# Patient Record
Sex: Female | Born: 1935 | Race: White | Hispanic: No | Marital: Married | Smoking: Never smoker
Health system: Southern US, Community
[De-identification: ages and names within clinical notes are randomized; demographics above are authoritative.]

## PROBLEM LIST (undated history)

## (undated) DIAGNOSIS — M069 Rheumatoid arthritis, unspecified: Secondary | ICD-10-CM

## (undated) DIAGNOSIS — M797 Fibromyalgia: Secondary | ICD-10-CM

## (undated) HISTORY — PX: CYSTOSCOPY: SUR368

## (undated) HISTORY — PX: DILATION AND CURETTAGE OF UTERUS: SHX78

## (undated) HISTORY — PX: OTHER SURGICAL HISTORY: SHX169

## (undated) HISTORY — PX: TONSILLECTOMY: SUR1361

---

## 1999-01-22 ENCOUNTER — Other Ambulatory Visit: Admission: RE | Admit: 1999-01-22 | Discharge: 1999-01-22 | Payer: Self-pay | Admitting: Obstetrics & Gynecology

## 1999-07-09 ENCOUNTER — Encounter: Admission: RE | Admit: 1999-07-09 | Discharge: 1999-08-29 | Payer: Self-pay | Admitting: Specialist

## 1999-11-27 ENCOUNTER — Encounter: Payer: Self-pay | Admitting: Specialist

## 1999-11-27 ENCOUNTER — Ambulatory Visit (HOSPITAL_COMMUNITY): Admission: RE | Admit: 1999-11-27 | Discharge: 1999-11-27 | Payer: Self-pay | Admitting: Specialist

## 2000-04-07 ENCOUNTER — Other Ambulatory Visit: Admission: RE | Admit: 2000-04-07 | Discharge: 2000-04-07 | Payer: Self-pay | Admitting: Obstetrics & Gynecology

## 2001-05-24 ENCOUNTER — Other Ambulatory Visit: Admission: RE | Admit: 2001-05-24 | Discharge: 2001-05-24 | Payer: Self-pay | Admitting: Obstetrics & Gynecology

## 2002-11-08 ENCOUNTER — Emergency Department (HOSPITAL_COMMUNITY): Admission: EM | Admit: 2002-11-08 | Discharge: 2002-11-08 | Payer: Self-pay | Admitting: Emergency Medicine

## 2002-12-08 ENCOUNTER — Encounter: Admission: RE | Admit: 2002-12-08 | Discharge: 2002-12-08 | Payer: Self-pay | Admitting: Interventional Radiology

## 2003-08-02 ENCOUNTER — Inpatient Hospital Stay (HOSPITAL_COMMUNITY): Admission: EM | Admit: 2003-08-02 | Discharge: 2003-08-06 | Payer: Self-pay | Admitting: Emergency Medicine

## 2003-08-23 ENCOUNTER — Encounter: Payer: Self-pay | Admitting: Gastroenterology

## 2003-09-13 ENCOUNTER — Ambulatory Visit (HOSPITAL_COMMUNITY): Admission: RE | Admit: 2003-09-13 | Discharge: 2003-09-13 | Payer: Self-pay | Admitting: Internal Medicine

## 2003-11-15 ENCOUNTER — Other Ambulatory Visit: Admission: RE | Admit: 2003-11-15 | Discharge: 2003-11-15 | Payer: Self-pay | Admitting: Obstetrics & Gynecology

## 2003-12-26 ENCOUNTER — Encounter (HOSPITAL_COMMUNITY): Admission: RE | Admit: 2003-12-26 | Discharge: 2004-03-25 | Payer: Self-pay | Admitting: Internal Medicine

## 2004-02-16 ENCOUNTER — Emergency Department (HOSPITAL_COMMUNITY): Admission: EM | Admit: 2004-02-16 | Discharge: 2004-02-17 | Payer: Self-pay | Admitting: Emergency Medicine

## 2005-04-09 ENCOUNTER — Encounter (HOSPITAL_COMMUNITY): Admission: RE | Admit: 2005-04-09 | Discharge: 2005-07-08 | Payer: Self-pay | Admitting: Internal Medicine

## 2005-06-12 ENCOUNTER — Emergency Department (HOSPITAL_COMMUNITY): Admission: EM | Admit: 2005-06-12 | Discharge: 2005-06-12 | Payer: Self-pay | Admitting: Emergency Medicine

## 2005-12-04 ENCOUNTER — Other Ambulatory Visit: Admission: RE | Admit: 2005-12-04 | Discharge: 2005-12-04 | Payer: Self-pay | Admitting: Obstetrics & Gynecology

## 2006-07-04 ENCOUNTER — Inpatient Hospital Stay (HOSPITAL_COMMUNITY): Admission: EM | Admit: 2006-07-04 | Discharge: 2006-07-08 | Payer: Self-pay | Admitting: Emergency Medicine

## 2006-07-08 ENCOUNTER — Ambulatory Visit: Payer: Self-pay | Admitting: Internal Medicine

## 2006-07-09 ENCOUNTER — Emergency Department (HOSPITAL_COMMUNITY): Admission: EM | Admit: 2006-07-09 | Discharge: 2006-07-09 | Payer: Self-pay | Admitting: Emergency Medicine

## 2006-09-07 ENCOUNTER — Ambulatory Visit: Payer: Self-pay | Admitting: Gastroenterology

## 2007-12-16 ENCOUNTER — Encounter: Admission: RE | Admit: 2007-12-16 | Discharge: 2007-12-16 | Payer: Self-pay | Admitting: Internal Medicine

## 2008-09-21 ENCOUNTER — Inpatient Hospital Stay (HOSPITAL_COMMUNITY): Admission: EM | Admit: 2008-09-21 | Discharge: 2008-09-25 | Payer: Self-pay | Admitting: Emergency Medicine

## 2009-11-05 ENCOUNTER — Inpatient Hospital Stay (HOSPITAL_COMMUNITY): Admission: EM | Admit: 2009-11-05 | Discharge: 2009-11-15 | Payer: Self-pay | Admitting: Emergency Medicine

## 2009-11-07 ENCOUNTER — Encounter (INDEPENDENT_AMBULATORY_CARE_PROVIDER_SITE_OTHER): Payer: Self-pay | Admitting: *Deleted

## 2009-11-22 ENCOUNTER — Encounter (INDEPENDENT_AMBULATORY_CARE_PROVIDER_SITE_OTHER): Payer: Self-pay | Admitting: *Deleted

## 2009-12-12 ENCOUNTER — Ambulatory Visit (HOSPITAL_COMMUNITY): Admission: RE | Admit: 2009-12-12 | Discharge: 2009-12-12 | Payer: Self-pay | Admitting: Internal Medicine

## 2010-03-05 ENCOUNTER — Encounter (HOSPITAL_COMMUNITY): Admission: RE | Admit: 2010-03-05 | Discharge: 2010-03-06 | Payer: Self-pay | Admitting: Internal Medicine

## 2010-03-30 ENCOUNTER — Encounter (INDEPENDENT_AMBULATORY_CARE_PROVIDER_SITE_OTHER): Payer: Self-pay | Admitting: *Deleted

## 2010-03-30 ENCOUNTER — Emergency Department (HOSPITAL_COMMUNITY): Admission: EM | Admit: 2010-03-30 | Discharge: 2010-03-30 | Payer: Self-pay | Admitting: Emergency Medicine

## 2010-04-15 ENCOUNTER — Emergency Department (HOSPITAL_COMMUNITY): Admission: EM | Admit: 2010-04-15 | Discharge: 2010-04-15 | Payer: Self-pay | Admitting: Emergency Medicine

## 2010-05-07 ENCOUNTER — Ambulatory Visit: Payer: Self-pay | Admitting: Gastroenterology

## 2010-05-07 DIAGNOSIS — M069 Rheumatoid arthritis, unspecified: Secondary | ICD-10-CM

## 2010-05-07 DIAGNOSIS — E119 Type 2 diabetes mellitus without complications: Secondary | ICD-10-CM

## 2010-05-07 DIAGNOSIS — IMO0001 Reserved for inherently not codable concepts without codable children: Secondary | ICD-10-CM

## 2010-05-07 DIAGNOSIS — K5732 Diverticulitis of large intestine without perforation or abscess without bleeding: Secondary | ICD-10-CM

## 2010-06-04 ENCOUNTER — Encounter (HOSPITAL_COMMUNITY)
Admission: RE | Admit: 2010-06-04 | Discharge: 2010-09-02 | Payer: Self-pay | Source: Home / Self Care | Admitting: Internal Medicine

## 2010-06-25 ENCOUNTER — Ambulatory Visit: Payer: Self-pay | Admitting: Gastroenterology

## 2010-07-02 ENCOUNTER — Encounter: Payer: Self-pay | Admitting: Gastroenterology

## 2010-07-26 ENCOUNTER — Ambulatory Visit: Payer: Self-pay | Admitting: Cardiology

## 2010-08-27 ENCOUNTER — Ambulatory Visit: Payer: Self-pay | Admitting: Cardiology

## 2010-09-03 ENCOUNTER — Encounter (HOSPITAL_COMMUNITY)
Admission: RE | Admit: 2010-09-03 | Discharge: 2010-10-29 | Payer: Self-pay | Source: Home / Self Care | Attending: Internal Medicine | Admitting: Internal Medicine

## 2010-10-29 NOTE — Letter (Signed)
Summary: Diabetic Instructions   Gastroenterology  [address] [place], [place] [address]   Phone: [phone]  Fax: [phone]    MAELIN KURKOWSKI 12/03/1933 MRN: [id number]   X  ORAL DIABETIC MEDICATION INSTRUCTIONS  The day before your procedure:   Take your diabetic pill as you do normally  The day of your procedure:   Do not take your diabetic pill    We will check your blood sugar levels during the admission process and again in Recovery before discharging you home  ________________________________________________________________________  _  _   INSULIN (LONG ACTING) MEDICATION INSTRUCTIONS (Lantus, NPH, 70/30, Humulin, Novolin-N)   The day before your procedure:   Take  your regular evening dose    The day of your procedure:   Do not take your morning dose    _  _   INSULIN (SHORT ACTING) MEDICATION INSTRUCTIONS (Regular, Humulog, Novolog)   The day before your procedure:   Do not take your evening dose   The day of your procedure:   Do not take your morning dose   _  _   INSULIN PUMP MEDICATION INSTRUCTIONS  We will contact the physician managing your diabetic care for written dosage instructions for the day before your procedure and the day of your procedure.  Once we have received the instructions, we will contact you.

## 2010-10-29 NOTE — Procedures (Signed)
Summary: colonoscopy   Colonoscopy  Procedure date:  08/23/2003  Findings:      Results: Normal. Location:  [facility].   Patient Name: Patricia Mosley, Patricia Mosley MRN:  Procedure Procedures: Colonoscopy CPT: 16109.  Personnel: Endoscopist: Barbette Hair. Arlyce Dice, MD.  Indications Symptoms: Diarrhea  History  Current Medications: Patient is not currently taking Coumadin.  Pre-Exam Physical: Performed Aug 23, 2003. Entire physical exam was normal.  Exam Exam: Extent of exam reached: Cecum, extent intended: Cecum.  The cecum was identified by IC valve. Colon retroflexion performed. ASA Classification: II. Tolerance: good.  Monitoring: Pulse and BP monitoring, Oximetry used. Supplemental O2 given. at 2 Liters.  Colon Prep Used Golytely for colon prep. Prep results: excellent.  Sedation Meds: Fentanyl 125 mcg. given IV. Versed 12.5 given IV.  Findings NORMAL EXAM: Transverse Colon.  NORMAL EXAM: Ascending Colon.  NORMAL EXAM: Descending Colon.  NORMAL EXAM: Cecum.  NORMAL EXAM: Sigmoid Colon.  NORMAL EXAM: Rectum.   Assessment Normal examination.  Events  Unplanned Interventions: No intervention was required.  Unplanned Events: There were no complications. Plans Medication Plan: Fiber supplements: Bran 1 Tbsp QD,   Scheduling/Referral: Office Visit, to [employer]. Arlyce Dice, MD, around Sep 13, 2003.    This report was created from the original endoscopy report, which was reviewed and signed by the above listed endoscopist.

## 2010-10-29 NOTE — Assessment & Plan Note (Signed)
Summary: nauseated//diarrhea--ch.   History of Present Illness Visit Type: Initial Consult Primary GI MD: Melvia Heaps MD [facility] Primary Provider: Rodrigo Ran, MD Chief Complaint: follow-up ER Visit  No complaints at this time History of Present Illness:   Patricia Mosley is a pleasant 77 year old white female referred  at the request of Dr. Waynard Edwards evaluation for evaluation of abdominal pain. In February, 2011 she was hospitalized with a walled off diverticular abscess of the sigmoid.  She was treated conservatively with antibiotics.  Approximately 1 month ago she was seen in the ER with right lower quadrant pain.  CT Scan, which I reviewed, demonstrated some thickening of the sigmoid colon and mild stranding consistent with diverticulitis for which she was placed on antibiotics.  Pain has entirely resolved.  She currently has no GI complaints.  Colonoscopy in 2004 was entirely normal.  She denies change in bowel habits melena or hematochezia.   GI Review of Systems    Reports abdominal pain, acid reflux, and  chest pain.      Denies belching, bloating, dysphagia with liquids, dysphagia with solids, heartburn, loss of appetite, nausea, vomiting, vomiting blood, weight loss, and  weight gain.        Denies anal fissure, black tarry stools, change in bowel habit, constipation, diarrhea, diverticulosis, fecal incontinence, heme positive stool, hemorrhoids, irritable bowel syndrome, jaundice, light color stool, liver problems, rectal bleeding, and  rectal pain. Preventive Screening-Counseling & Management  Alcohol-Tobacco     Smoking Status: never      Drug Use:  no.      Current Medications (verified): 1)  Tylenol Arthritis Pain 650 Mg Cr-Tabs (Acetaminophen) .... As Needed 2)  Fluoxetine Hcl 20 Mg Tabs (Fluoxetine Hcl) .Marland Kitchen.. 1 By Mouth Once Daily 3)  Hydroxychloroquine Sulfate 200 Mg Tabs (Hydroxychloroquine Sulfate) .Marland Kitchen.. 1 By Mouth Two Times A Day 4)  Januvia 100 Mg Tabs (Sitagliptin  Phosphate) .Marland KitchenMarland KitchenMarland Kitchen 150 Mg Daily 5)  Lorazepam 0.5 Mg Tabs (Lorazepam) .Marland Kitchen.. 1 By Mouth As Needed 6)  Prednisone 5 Mg Tabs (Prednisone) .... 7.5 Mg Once Daily 7)  Vitamin D3 1000 Unit Tabs (Cholecalciferol) .... Take Two Times A Day 8)  Boniva 3 Mg/39ml Kit (Ibandronate Sodium) .... Inject Quartly  Allergies (verified): 1)  ! * Actonel 2)  ! Codeine 3)  ! Demerol 4)  ! Doxycycline 5)  ! Epinephrine 6)  ! Flagyl 7)  ! * Humira 8)  ! Methotrexate 9)  ! Neurontin 10)  ! Pcn 11)  ! * Remicade 12)  ! * Robaxin 13)  ! Sulfa 14)  ! Talwin 15)  ! Tetracycline 16)  ! * Shellfish  Past History:  Past Medical History: Diverticular abscess Rheumatoid Arthritis Type 2 diabetes GERD Osetoporosis Anxiety Disorder Arthritis Diverticulitis Fibromyalgia  Past Surgical History: Appendectomy Womb  Tonsillectomy  Family History: Reviewed history and no changes required. Family History of Breast Cancer:sister  Aunt No FH of Colon Cancer:  Social History: Reviewed history and no changes required. Married 2 boys Retired Patient has never smoked.  Alcohol Use - no Daily Caffeine Use Illicit Drug Use - no Smoking Status:  never Drug Use:  no  Review of Systems       The patient complains of arthritis/joint pain, back pain, fatigue, itching, and skin rash.  The patient denies allergy/sinus, anemia, anxiety-new, blood in urine, breast changes/lumps, change in vision, confusion, cough, coughing up blood, depression-new, fainting, fever, headaches-new, hearing problems, heart murmur, heart rhythm changes, muscle pains/cramps, night sweats, nosebleeds, shortness  of breath, sleeping problems, sore throat, swelling of feet/legs, swollen lymph glands, thirst - excessive, urination - excessive, urination changes/pain, urine leakage, vision changes, and voice change.         All other systems were reviewed and were negative   Vital Signs:  Patient profile:   77 year old female Height:       65 inches Weight:      149 pounds BMI:     24.88 Pulse rate:   96 / minute Pulse rhythm:   regular BP sitting:   102 / 76  (left arm)  Vitals Entered By: Milford Cage NCMA (May 07, 2010 3:11 PM)  Physical Exam  Additional Exam:  On physical exam she is a well-developed large female  Physical Exam: General:   WDWN HEENT:   anicteric.  No pharyngeal abnormalities Neck:   No masses, thyroidmegaly Nodes:   No cervical, axillary, inguinal adenopathy Chest:    Clear to auscultation Cardiac:   No murmurs, gallops, rubs Abdomen:   BS active.  No abd masses, tenderness, organomegaly Rectal:   Deferred Extremities:   No cyanosis, clubbing, edema Skeletal:   No deformities Neuro:   Alert, oriented x3.  No focal abnormalities     Impression & Recommendations:  Problem # 1:  DIVERTICULITIS OF COLON (ICD-562.11)  Clinically  she has had diverticulitis twice in the last 6 months.  It is interesting that no diverticula were seen at her last colonoscopy,  which at least raises the question of a neoplastic process masking as diverticulitis.  Recommendations #1 colonoscopy  Risks, alternatives, and complications of the procedure, including bleeding, perforation, and possible need for surgery, were explained to the patient.  Patient's questions were answered.  Orders: Colonoscopy (Colon)  Problem # 2:  RHEUMATOID ARTHRITIS (ICD-714.0) Assessment: Comment Only  Problem # 3:  DM (ICD-250.00) Assessment: Comment Only  Problem # 4:  FIBROMYALGIA (ICD-729.1) Assessment: Comment Only  Patient Instructions: 1)  Copy sent to : Rodrigo Ran, MD 2)  Colonoscopy and Flexible Sigmoidoscopy brochure given.  3)  Conscious Sedation brochure given.  4)  Your Colonoscopy is scheduled for 06/11/2010 at 3pm 5)  You can pick up your MoviPrep today 6)  The medication list was reviewed and reconciled.  All changed / newly prescribed medications were explained.  A complete medication list  was provided to the patient / caregiver. Prescriptions: MOVIPREP 100 GM  SOLR (PEG-KCL-NACL-NASULF-NA ASC-C) As per prep instructions.  #1 x 0   Entered by:   Merri Ray CMA (AAMA)   Authorized by:   Louis Meckel MD   Signed by:   Louis Meckel MD on 05/08/2010   Method used:   Electronically to        [facility] (retail)       [address]       [place], [place]  [address]       Ph: [phone] or [phone]       Fax: [phone]   RxID:   [id number]

## 2010-10-29 NOTE — Discharge Summary (Signed)
Summary: Abdominal pain and diverticulitis  NAME:  Patricia Mosley, Patricia Mosley                ACCOUNT NO.:  [id number]      MEDICAL RECORD NO.:  [id number]          PATIENT TYPE:  INP      LOCATION:  5148                         FACILITY:  [facility]      PHYSICIAN:  Mark A. Perini, M.D.   DATE OF BIRTH:  [birth year]      DATE OF ADMISSION:  11/05/2009   DATE OF DISCHARGE:  11/15/2009                                  DISCHARGE SUMMARY      DISCHARGE DIAGNOSES:   1. Diverticular abscess.   2. Rheumatoid arthritis.   3. Chronic generalized body pain including shoulder pain.   4. Chronic immunosuppression with prednisone and recently Plaquenil       and Arava as well.   5. Abdominal pain due to diverticular abscess.   6. Type 2 diabetes.   7. Gastroesophageal reflux.   8. Osteoporosis.   9. Multiple drug intolerances.      CONSULTATIONS:  General Surgery consultation.      PROCEDURE:  CT scan of the abdomen and pelvis with IV contrast.  She had   one on November 09, 2009 and one on November 05, 2009.  On the November 09, 2009 study, she had improvement in the pericolonic abscess with   decrease in size and decrease in fluid with only a focal sigmoid colon,   mucosal thickening and a small amount of air remaining.  She also had   duplication of the left ureter.  No hydronephrosis or hydroureter.  She   had degenerative changes of the lumbar spine noted.  She had a stable 5-   mm nodule in the left lung base laterally and a moderate-sized hiatal   hernia.      DISCHARGE MEDICATIONS:   1. Tylenol 650 mg every 6 hours as needed.   2. Bismuth subsalicylate 30 mL every 6 hours as needed.   3. Loperamide 2 mg as needed 2-3 times a day.   4. Over-the-counter Align 1 tablet daily, to take for an additional 14       days and then stop.   5. Calcium carbonate 600 mg with vitamin D 1 tablet daily with food.   6. Fluoxetine 20 mg daily.   7. Folic acid 1 mg daily.   8. Hydroxychloroquine 200 mg twice daily.    9. Januvia 150 mg daily.   10.Lorazepam 0.5 mg each evening as needed.   11.Prednisone 7.5 mg daily.   12.Ranitidine 150 mg twice daily.   13.Vitamin D3 1000 units 3 tablets daily.      HISTORY OF PRESENT ILLNESS:  Patricia Mosley is a pleasant 77 year old female who   presented with abdominal pain.  She had originally seen on February 1   and we have felt her symptoms were due to her Arava therapy.  However,   her symptoms worsened and on the day of admission, she had severe   abdominal pain and presented to the emergency room where she was found   to have a diverticular abscess.  HOSPITAL COURSE:  [place] was admitted to a regular bed.  She was placed   on ertapenem per pharmacy, which was a good choice for her based on her   multiple drug intolerances.  Fortunately, she tolerated the ertapenem   well with no severe side effects.  She did have some loose stool   periodically, but was ruled out for C. difficile colitis.  Her abdominal   pain gradually improved over the ensuing 3-4 days.  There was no easy   oral antibiotics to switch her to, so we decided to complete a full 10-   day course of the IV antibiotic in the hospital, which she completed   without difficulty.  Fortunately, a repeat CT scan showed improvement of   her diverticular abscess and on November 15, 2009, she was deemed stable   for discharge home.      DISCHARGE PHYSICAL EXAMINATION:  VITAL SIGNS:  Temperature 97.6,   afebrile, pulse 68, respiratory 19, blood pressure 120/74, 97%   saturation on room air.  Blood sugars ranged from 103 to 159.   GENERAL:  She is in no acute distress.  Alert and oriented x4.   LUNGS:  Clear to auscultation bilaterally with no wheezes, rales, or   rhonchi.   HEART:  Regular rate and rhythm with no murmur, rub, or gallop.   ABDOMEN:  Soft, nontender, nondistended with no mass or   hepatosplenomegaly.  There was no peripheral edema.      DISCHARGE LABORATORY DATA:  On November 14, 2009,  sodium 140, potassium   4.3, chloride 104, CO2 30, BUN 10, creatinine 0.97, glucose 112.  GFR   56.  Total bili 0.6, alk phos 44, AST 43, ALT 42, total protein 5.9,   albumin 2.9, calcium 8.7.  White count 8.6, hemoglobin 13.5, platelet   count 393,000.  There were 61% segs, 28% lymphocytes, 9% monocytes.   Hemoglobin A1c this admission was 6.6%.      DISCHARGE INSTRUCTIONS:  Aki is to follow a low-salt, heart-healthy   diet.  She has no wounds to care for.  She will follow up in our office   in 2 weeks and she will follow up with Dr. Luisa Hart of General Surgery in   3-4 weeks.  She is to call if she has any recurrent problems.               Mark A. Waynard Edwards, M.D.            MAP/MEDQ  D:  11/21/2009  T:  11/22/2009  Job:  [id number]      Electronically Signed by Rodrigo Ran M.D. on 11/22/2009 11:54:04 AM   Electronically Signed by Rodrigo Ran M.D. on 11/22/2009 11:54:04 AM

## 2010-10-29 NOTE — Procedures (Signed)
Summary: Colonoscopy  Patient: Patricia Mosley Note: All result statuses are Final unless otherwise noted.  Tests: (1) Colonoscopy (COL)   COL Colonoscopy           DONE     [facility]     [address]     [place], [place]  [address]           COLONOSCOPY PROCEDURE REPORT           PATIENT:  Patricia Mosley, Patricia Mosley  MR#:  [id number]     BIRTHDATE:  November 03, 1933, 76 yrs. old  GENDER:  female           ENDOSCOPIST:  Barbette Hair. Arlyce Dice, MD     Referred by:           PROCEDURE DATE:  06/25/2010     PROCEDURE:  Colonoscopy with snare polypectomy     ASA CLASS:  Class II     INDICATIONS:  1) diverticulitis h/o recurrent diverticulitis           MEDICATIONS:   Fentanyl 100 mcg IV, Versed 10 mg IV, Benadryl 25     mg IV           DESCRIPTION OF PROCEDURE:   After the risks benefits and     alternatives of the procedure were thoroughly explained, informed     consent was obtained.  Digital rectal exam was performed and     revealed no abnormalities.   The LB [id number] endoscope     was introduced through the anus and advanced to the cecum, which     was identified by the ileocecal valve, without limitations.  The     quality of the prep was excellent, using MiraLax.  The instrument     was then slowly withdrawn as the colon was fully examined.     <<PROCEDUREIMAGES>>           FINDINGS:  A sessile polyp was found in the cecum. It was 5 mm in     size. Polyp was snared without cautery. Retrieval was successful     (see image2). snare polyp  Severe diverticulosis was found in the     sigmoid colon (see image8, image9, and image10). Marked muscular     hypertrophy, lumenal spasm and multiple diverticula  Internal     hemorrhoids were found (see image12).  This was otherwise a normal     examination of the colon (see image1, image3, image4, image6,     image7, and image11).   Retroflexed views in the rectum revealed     no abnormalities.    The time to cecum =  7.75  minutes. The  scope     was then withdrawn (time =  6.50  min) from the patient and the     procedure completed.           COMPLICATIONS:  None           ENDOSCOPIC IMPRESSION:     1) 5 mm sessile polyp in the cecum     2) Severe diverticulosis in the sigmoid colon     3) Internal hemorrhoids     4) Otherwise normal examination     RECOMMENDATIONS:     1) If the polyp(s) removed today are proven to be adenomatous     (pre-cancerous) polyps, you will need a repeat colonoscopy in 5     years. Otherwise you should continue to follow colorectal cancer  screening guidelines for "routine risk" patients with colonoscopy     in 10 years.           REPEAT EXAM:   You will receive a letter from Dr. Arlyce Dice in 1-2     weeks, after reviewing the final pathology, with followup     recommendations.           ______________________________     Barbette Hair Arlyce Dice, MD           CC: Rodrigo Ran, MD           n.     Rosalie DoctorBarbette Hair. Vaishali Baise at 06/25/2010 03:58 PM           Page 2 of 3   Patricia Mosley, Patricia Mosley, [id number]  Note: An exclamation mark (!) indicates a result that was not dispersed into the flowsheet. Document Creation Date: 06/25/2010 3:58 PM _______________________________________________________________________  (1) Order result status: Final Collection or observation date-time: 06/25/2010 15:51 Requested date-time:  Receipt date-time:  Reported date-time:  Referring Physician:   Ordering Physician: Melvia Heaps [id number]) Specimen Source:  Source: Launa Grill Order Number: [id number] Lab site:   Appended Document: Colonoscopy     Procedures Next Due Date:    Colonoscopy: 05/2015

## 2010-10-29 NOTE — Consult Note (Signed)
Patricia Mosley, Patricia Mosley                ACCOUNT NO.:  [id number]      MEDICAL RECORD NO.:  [id number]          PATIENT TYPE:  INP      LOCATION:  5148                         FACILITY:  [facility]      PHYSICIAN:  Maisie Fus A. Cornett, M.D.DATE OF BIRTH:  01-11-1934      DATE OF CONSULTATION:  11/05/2009   DATE OF DISCHARGE:                                    CONSULTATION      REQUESTING PHYSICIAN:  Mark A. Perini, MD      REASON FOR CONSULTATION:  Abdominal pain, diverticulitis.      HISTORY OF PRESENT ILLNESS:  Patricia Mosley is a pleasant 77 year old female   with complicated past medical history as seen below.  She has a history   of rheumatoid arthritis and fibromyalgia and was recently started on a   new medication I believe for her rheumatoid arthritis.  She reported   that shortly after it made her feel sick and she had abdominal   discomfort.  She discontinued this about a week ago but reported   continued back discomfort.  However, her abdomen has remained sore and   she has had increasingly more frequent abdominal pains, particularly in   the periumbilical and lower abdomen area.  She denies any hematemesis or   hematochezia.  She reports that her bowel movements have become more   frequent, but they are not loose, they continue to remain formed.  She   is not sure if this is related to the medication as well.  She denies   any fever, denies any vomiting but states she has been nauseated.   Again, she correlates this possibly to the medication.  She denies any   weight loss.  She denies any known bowel dysfunction.  She tells me that   she has had a previous history of ischemic colitis from a bleed from her   medication.  She has not had any significant trouble with her colon   since that time to her knowledge.  She is not known to have any history   of diverticular disease.      PAST MEDICAL HISTORY:  Significant for:   1. Rheumatoid arthritis.   2. Diet-controlled diabetes  mellitus.   3. Stage II chronic kidney insufficiency.   4. Fibromyalgia.   5. Osteoporosis.   6. Anxiety and depressive disorder.      PAST SURGICAL HISTORY:  The patient has had a cardiac catheterization in   December 2009, otherwise, negative.      FAMILY HISTORY:  Noncontributory at the present case.      SOCIAL HISTORY:  The patient denies any alcohol, tobacco or illicit drug   use.  She is married and currently lives at home with her husband.  She   has several children and multiple grandchildren.      DRUG ALLERGIES:  CODEINE, EPINEPHRINE, SULFA, PENICILLIN, DEMEROL,   TALWIN, ROBAXIN, NEURONTIN, QUININE, DILAUDID and FLAGYL.      CURRENT MEDICATIONS:  Caltrate plus D twice daily, folic acid, Januvia,  lorazepam, Plaquenil, prednisone, Phenergan, Prozac, ranitidine, Tylenol   and vitamin D supplement.      REVIEW OF SYSTEMS:  Found on the HPI otherwise, negative.      PHYSICAL EXAMINATION:  GENERAL:  A 77 year old pleasant white female   resting comfortably in the emergency room bed.  She does not appear to   be in acute distress.  She is nondiaphoretic and nontoxic appearing.   VITAL SIGNS:  Temperature of 98.8, heart rate of 84, respiratory rate of   18, blood pressure 114/71.   ENT:  Unremarkable.   NECK:  Supple without lymphadenopathy.  Trachea is midline.  Thyroid is   without masses or enlargement.   CHEST:  Clear to auscultation.  No wheezes, rhonchi or rales.  Normal   respiratory effort without use of accessory muscles.   HEART:  Regular rate and rhythm.  No murmurs, gallops or rubs.  Carotids   are 2+ and symmetrical without bruit.  Peripheral pulses are 2+ and   symmetrical.   ABDOMEN:  Soft, nondistended.  She is tender and does guard slightly   with deep palpation of the mid to lower suprapubic region.  She   otherwise has audible bowel sounds and otherwise no evidence of   herniation or organomegaly.   PELVIC:  Deferred.   GENITOURINARY:  Deferred.    RECTAL:  Deferred.   EXTREMITIES:  Within normal range of motion.  The patient has some   rheumatoid arthritis, defects with palmar deviation of the upper   extremity digits.  She otherwise has good muscle strength and tone   without atrophy.   SKIN:  Warm and dry, nondiaphoretic, no rashes or lesions are   appreciated, no lesions or nodules are palpated.   NEUROLOGIC:  The patient is alert and oriented x3.  Cranial nerves II-   XII grossly intact.      DIAGNOSTICS:  CBC performed in the emergency department reveals a white   blood cell count of 17.4, hemoglobin of 14.0, hematocrit of 41.8,   platelet count of 327.  Fecal sample for heme is positive.  Metabolic   panel reveals a sodium of 133, potassium 3.8, chloride of 98, CO2 of 24,   BUN of 12, creatinine of 1.0, glucose of 138.  Liver enzymes within   normal limits including a lipase of 29.  Urinalysis is negative.      IMAGING:  CT scan of the patient's abdomen and pelvis performed shows a   3.6 x 2.2 cm fluid collection in the mid left lower quadrant just above   and adjacent to the sigmoid colon.  There is high suspicion for evidence   of diverticular abscess without perforation.  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      IMPRESSION:   1. Left lower quadrant pain, likely associated to diverticulitis with       abscess.   2. Rheumatoid arthritis with chronic pain.   3. Fibromyalgia.   4. Diet controlled diabetes mellitus.      PLAN:  Agree with admission for IV fluid resuscitation, IV antibiotics   consisting of Invanz, hopefully, she can tolerate this given her   numerous allergies.  I have reviewed the CT scan and feel this would be   difficult for percutaneous drainage at present time.  We have discussed   with the patient the plan for conservative management with bowel rest   and  IV antibiotics.  Should she now show signs of improvement, a repeat    imaging may be warranted to determine if the abscess has increased in   size and may be more amenable to drainage.  Hopefully, she will tolerate   conservative management.  The abscess will diminish in size as well her   clinical symptoms and she can be discharged home on continued oral   management.  The patient does understand that if she does not progress   she may still need percutaneous drainage and if that is not amendable   she still may require exploratory laparotomy with the remote possibility   of end colostomy or temporary colostomy as part of her treatment.  She   was extremely anxious   about that and is only willing to participate with whatever conservative   management can be offered.  We will continue to follow this patient.  I   appreciate the opportunity to consult on her and participate in her   care.  The patient was actually seen by Dr. Carolynne Edouard in the emergency   department, will be followed by Dr. Harriette Bouillon.               Patricia El, PA-C         ______________________________   Clovis Pu Cornett, M.D.         KB/MEDQ  D:  11/06/2009  T:  11/07/2009  Job:  [id number]      cc:   Loraine Leriche A. Perini, M.D.   [facility]         Electronically Signed by Patricia Mosley  on 11/09/2009 11:14:30 AM      Electronically Signed by Harriette Bouillon M.D. on 11/12/2009 12:29:34 PM

## 2010-10-29 NOTE — Letter (Signed)
Summary: [facility] Instructions  [facility]  [address] [place], [place] [address]   Phone: [phone]  Fax: [phone]       Patricia Mosley    Nov 05, 1933    MRN: [id number]        Procedure Day /Date:TUESDAY 06/11/2010     Arrival Time:2PM     Procedure Time:3PM     Location of Procedure:                    X   [facility] (4th Floor)   PREPARATION FOR COLONOSCOPY WITH MOVIPREP   Starting 5 days prior to your procedure9/04/2010 do not eat nuts, seeds, popcorn, corn, beans, peas,  salads, or any raw vegetables.  Do not take any fiber supplements (e.g. Metamucil, Citrucel, and Benefiber).  THE DAY BEFORE YOUR PROCEDURE         DATE:06/10/2010  DAY: MONDAY  1.  Drink clear liquids the entire day-NO SOLID FOOD  2.  Do not drink anything colored red or purple.  Avoid juices with pulp.  No orange juice.  3.  Drink at least 64 oz. (8 glasses) of fluid/clear liquids during the day to prevent dehydration and help the prep work efficiently.  CLEAR LIQUIDS INCLUDE: Water Jello Ice Popsicles Tea (sugar ok, no milk/cream) Powdered fruit flavored drinks Coffee (sugar ok, no milk/cream) Gatorade Juice: apple, white grape, white cranberry  Lemonade Clear bullion, consomm, broth Carbonated beverages (any kind) Strained chicken noodle soup Hard Candy                             4.  In the morning, mix first dose of MoviPrep solution:    Empty 1 Pouch A and 1 Pouch B into the disposable container    Add lukewarm drinking water to the top line of the container. Mix to dissolve    Refrigerate (mixed solution should be used within 24 hrs)  5.  Begin drinking the prep at 5:00 p.m. The MoviPrep container is divided by 4 marks.   Every 15 minutes drink the solution down to the next mark (approximately 8 oz) until the full liter is complete.   6.  Follow completed prep with 16 oz of clear liquid of your choice (Nothing red or purple).  Continue to drink  clear liquids until bedtime.  7.  Before going to bed, mix second dose of MoviPrep solution:    Empty 1 Pouch A and 1 Pouch B into the disposable container    Add lukewarm drinking water to the top line of the container. Mix to dissolve    Refrigerate  THE DAY OF YOUR PROCEDURE      DATE: 06/11/2010 DAY: TUESDAY  Beginning at 10a.m. (5 hours before procedure):         1. Every 15 minutes, drink the solution down to the next mark (approx 8 oz) until the full liter is complete.  2. Follow completed prep with 16 oz. of clear liquid of your choice.    3. You may drink clear liquids until 1PM(2 HOURS BEFORE PROCEDURE).   MEDICATION INSTRUCTIONS  Unless otherwise instructed, you should take regular prescription medications with a small sip of water   as early as possible the morning of your procedure.  Diabetic patients - see separate instructions.          OTHER INSTRUCTIONS  You will need a responsible adult at least 75 years  of age to accompany you and drive you home.   This person must remain in the waiting room during your procedure.  Wear loose fitting clothing that is easily removed.  Leave jewelry and other valuables at home.  However, you may wish to bring a book to read or  an iPod/MP3 player to listen to music as you wait for your procedure to start.  Remove all body piercing jewelry and leave at home.  Total time from sign-in until discharge is approximately 2-3 hours.  You should go home directly after your procedure and rest.  You can resume normal activities the  day after your procedure.  The day of your procedure you should not:   Drive   Make legal decisions   Operate machinery   Drink alcohol   Return to work  You will receive specific instructions about eating, activities and medications before you leave.    The above instructions have been reviewed and explained to me by   _______________________    I fully understand and can  verbalize these instructions _____________________________ Date _________

## 2010-10-29 NOTE — Letter (Signed)
Summary: Patient Notice- Polyp Results  [facility]  [address] [place], [place] [address]   Phone: [phone]  Fax: [phone]        July 02, 2010 MRN: [id number]    Patricia Mosley [address] [place], [place]  [address]    Dear [name],  I am pleased to inform you that the colon polyp(s) removed during your recent colonoscopy was (were) found to be benign (no cancer detected) upon pathologic examination.  I recommend you have a repeat colonoscopy examination in _5 years to look for recurrent polyps, as having colon polyps increases your risk for having recurrent polyps or even colon cancer in the future.  Should you develop new or worsening symptoms of abdominal pain, bowel habit changes or bleeding from the rectum or bowels, please schedule an evaluation with either your primary care physician or with me.  Additional information/recommendations:  __ No further action with gastroenterology is needed at this time. Please      follow-up with your primary care physician for your other healthcare      needs.  __ Please call [phone] to schedule a return visit to review your      situation.  __ Please keep your follow-up visit as already scheduled.  _x_ Continue treatment plan as outlined the day of your exam.  Please call us if you are having persistent problems or have questions about your condition that have not been fully answered at this time.  Sincerely,  Louis Meckel MD  This letter has been electronically signed by your physician.  Appended Document: Patient Notice- Polyp Results letter mailed

## 2010-11-01 ENCOUNTER — Emergency Department (HOSPITAL_COMMUNITY): Payer: Medicare Other

## 2010-11-01 ENCOUNTER — Inpatient Hospital Stay (HOSPITAL_COMMUNITY)
Admission: EM | Admit: 2010-11-01 | Discharge: 2010-11-10 | DRG: 392 | Disposition: A | Discharge status: Home / Self Care | Payer: Medicare Other | Attending: Internal Medicine | Admitting: Internal Medicine

## 2010-11-01 ENCOUNTER — Encounter (HOSPITAL_COMMUNITY): Payer: Self-pay | Admitting: Radiology

## 2010-11-01 DIAGNOSIS — M069 Rheumatoid arthritis, unspecified: Secondary | ICD-10-CM | POA: Diagnosis present

## 2010-11-01 DIAGNOSIS — F341 Dysthymic disorder: Secondary | ICD-10-CM | POA: Diagnosis present

## 2010-11-01 DIAGNOSIS — K219 Gastro-esophageal reflux disease without esophagitis: Secondary | ICD-10-CM | POA: Diagnosis present

## 2010-11-01 DIAGNOSIS — E119 Type 2 diabetes mellitus without complications: Secondary | ICD-10-CM | POA: Diagnosis present

## 2010-11-01 DIAGNOSIS — N183 Chronic kidney disease, stage 3 unspecified: Secondary | ICD-10-CM | POA: Diagnosis present

## 2010-11-01 DIAGNOSIS — K5732 Diverticulitis of large intestine without perforation or abscess without bleeding: Principal | ICD-10-CM | POA: Diagnosis present

## 2010-11-01 DIAGNOSIS — E559 Vitamin D deficiency, unspecified: Secondary | ICD-10-CM | POA: Diagnosis present

## 2010-11-01 DIAGNOSIS — M810 Age-related osteoporosis without current pathological fracture: Secondary | ICD-10-CM | POA: Diagnosis present

## 2010-11-01 DIAGNOSIS — IMO0001 Reserved for inherently not codable concepts without codable children: Secondary | ICD-10-CM | POA: Diagnosis present

## 2010-11-01 LAB — URINALYSIS, ROUTINE W REFLEX MICROSCOPIC
Hgb urine dipstick: NEGATIVE
Protein, ur: NEGATIVE mg/dL
Urobilinogen, UA: 0.2 mg/dL (ref 0.0–1.0)

## 2010-11-01 LAB — DIFFERENTIAL
Basophils Relative: 0 % (ref 0–1)
Eosinophils Absolute: 0.1 10*3/uL (ref 0.0–0.7)
Monocytes Absolute: 1.4 10*3/uL — ABNORMAL HIGH (ref 0.1–1.0)
Monocytes Relative: 8 % (ref 3–12)
Neutrophils Relative %: 84 % — ABNORMAL HIGH (ref 43–77)

## 2010-11-01 LAB — BASIC METABOLIC PANEL
Calcium: 8.8 mg/dL (ref 8.4–10.5)
Creatinine, Ser: 1 mg/dL (ref 0.4–1.2)
GFR calc Af Amer: >60 mL/min (ref >60)
GFR calc non Af Amer: 54 mL/min — ABNORMAL LOW (ref >60)

## 2010-11-01 LAB — GLUCOSE, CAPILLARY: Glucose-Capillary: 130 mg/dL — ABNORMAL HIGH (ref 70–99)

## 2010-11-01 LAB — CBC
MCH: 28.7 pg (ref 26.0–34.0)
MCHC: 32.9 g/dL (ref 30.0–36.0)
Platelets: 355 10*3/uL (ref 150–400)

## 2010-11-01 LAB — OCCULT BLOOD, POC DEVICE: Fecal Occult Bld: NEGATIVE

## 2010-11-01 MED ORDER — IOHEXOL 300 MG/ML  SOLN
80.0000 mL | Freq: Once | INTRAMUSCULAR | Status: AC | PRN
  Administered 2010-11-01: 80 mL via INTRAVENOUS

## 2010-11-02 LAB — COMPREHENSIVE METABOLIC PANEL
ALT: 16 U/L (ref 0–35)
AST: 22 U/L (ref 0–37)
Calcium: 8.5 mg/dL (ref 8.4–10.5)
GFR calc Af Amer: >60 mL/min (ref >60)
Glucose, Bld: 90 mg/dL (ref 70–99)
Sodium: 139 mEq/L (ref 135–145)
Total Protein: 5.6 g/dL — ABNORMAL LOW (ref 6.0–8.3)

## 2010-11-02 LAB — HEMOGLOBIN A1C
Hgb A1c MFr Bld: 7.1 % — ABNORMAL HIGH (ref <5.7)
Mean Plasma Glucose: 157 mg/dL — ABNORMAL HIGH (ref <117)

## 2010-11-02 LAB — GLUCOSE, CAPILLARY
Glucose-Capillary: 100 mg/dL — ABNORMAL HIGH (ref 70–99)
Glucose-Capillary: 177 mg/dL — ABNORMAL HIGH (ref 70–99)

## 2010-11-02 LAB — CBC
MCHC: 32.1 g/dL (ref 30.0–36.0)
RDW: 14.1 % (ref 11.5–15.5)

## 2010-11-03 LAB — GLUCOSE, CAPILLARY
Glucose-Capillary: 113 mg/dL — ABNORMAL HIGH (ref 70–99)
Glucose-Capillary: 168 mg/dL — ABNORMAL HIGH (ref 70–99)
Glucose-Capillary: 163 mg/dL — ABNORMAL HIGH (ref 70–99)

## 2010-11-03 LAB — BASIC METABOLIC PANEL
CO2: 26 mEq/L (ref 19–32)
Chloride: 105 mEq/L (ref 96–112)
Creatinine, Ser: 0.87 mg/dL (ref 0.4–1.2)
GFR calc Af Amer: >60 mL/min (ref >60)

## 2010-11-03 LAB — CBC
HCT: 37.9 % (ref 36.0–46.0)
Hemoglobin: 12.2 g/dL (ref 12.0–15.0)
MCH: 28 pg (ref 26.0–34.0)
RBC: 4.35 MIL/uL (ref 3.87–5.11)

## 2010-11-04 LAB — BASIC METABOLIC PANEL
Calcium: 9 mg/dL (ref 8.4–10.5)
GFR calc Af Amer: >60 mL/min (ref >60)
GFR calc non Af Amer: >60 mL/min (ref >60)
Sodium: 141 mEq/L (ref 135–145)

## 2010-11-04 LAB — CBC
MCHC: 32 g/dL (ref 30.0–36.0)
RDW: 14.1 % (ref 11.5–15.5)

## 2010-11-04 LAB — GLUCOSE, CAPILLARY
Glucose-Capillary: 136 mg/dL — ABNORMAL HIGH (ref 70–99)
Glucose-Capillary: 171 mg/dL — ABNORMAL HIGH (ref 70–99)

## 2010-11-05 LAB — COMPREHENSIVE METABOLIC PANEL
ALT: 16 U/L (ref 0–35)
Calcium: 8.8 mg/dL (ref 8.4–10.5)
Creatinine, Ser: 1.09 mg/dL (ref 0.4–1.2)
GFR calc Af Amer: 59 mL/min — ABNORMAL LOW (ref >60)
Glucose, Bld: 109 mg/dL — ABNORMAL HIGH (ref 70–99)
Sodium: 141 mEq/L (ref 135–145)
Total Protein: 5.4 g/dL — ABNORMAL LOW (ref 6.0–8.3)

## 2010-11-05 LAB — GLUCOSE, CAPILLARY
Glucose-Capillary: 115 mg/dL — ABNORMAL HIGH (ref 70–99)
Glucose-Capillary: 217 mg/dL — ABNORMAL HIGH (ref 70–99)
Glucose-Capillary: 141 mg/dL — ABNORMAL HIGH (ref 70–99)

## 2010-11-05 LAB — DIFFERENTIAL
Basophils Absolute: 0 10*3/uL (ref 0.0–0.1)
Basophils Relative: 0 % (ref 0–1)
Eosinophils Relative: 1 % (ref 0–5)
Monocytes Absolute: 0.9 10*3/uL (ref 0.1–1.0)

## 2010-11-05 LAB — CBC
HCT: 40.2 % (ref 36.0–46.0)
MCHC: 32.3 g/dL (ref 30.0–36.0)
RDW: 14.2 % (ref 11.5–15.5)

## 2010-11-06 LAB — COMPREHENSIVE METABOLIC PANEL
AST: 24 U/L (ref 0–37)
Albumin: 3 g/dL — ABNORMAL LOW (ref 3.5–5.2)
Alkaline Phosphatase: 30 U/L — ABNORMAL LOW (ref 39–117)
Chloride: 106 mEq/L (ref 96–112)
GFR calc Af Amer: >60 mL/min (ref >60)
Potassium: 4 mEq/L (ref 3.5–5.1)
Sodium: 142 mEq/L (ref 135–145)
Total Bilirubin: 0.3 mg/dL (ref 0.3–1.2)
Total Protein: 5.7 g/dL — ABNORMAL LOW (ref 6.0–8.3)

## 2010-11-06 LAB — CBC
Platelets: 347 10*3/uL (ref 150–400)
RBC: 4.61 MIL/uL (ref 3.87–5.11)
WBC: 12.3 10*3/uL — ABNORMAL HIGH (ref 4.0–10.5)

## 2010-11-06 LAB — GLUCOSE, CAPILLARY
Glucose-Capillary: 93 mg/dL (ref 70–99)
Glucose-Capillary: 140 mg/dL — ABNORMAL HIGH (ref 70–99)
Glucose-Capillary: 199 mg/dL — ABNORMAL HIGH (ref 70–99)

## 2010-11-06 LAB — DIFFERENTIAL
Basophils Absolute: 0 10*3/uL (ref 0.0–0.1)
Basophils Relative: 0 % (ref 0–1)
Eosinophils Absolute: 0.2 10*3/uL (ref 0.0–0.7)
Lymphs Abs: 3 10*3/uL (ref 0.7–4.0)
Neutrophils Relative %: 64 % (ref 43–77)

## 2010-11-07 LAB — COMPREHENSIVE METABOLIC PANEL
AST: 24 U/L (ref 0–37)
BUN: 13 mg/dL (ref 6–23)
CO2: 28 mEq/L (ref 19–32)
Chloride: 105 mEq/L (ref 96–112)
Creatinine, Ser: 0.97 mg/dL (ref 0.4–1.2)
GFR calc non Af Amer: 56 mL/min — ABNORMAL LOW (ref >60)
Glucose, Bld: 101 mg/dL — ABNORMAL HIGH (ref 70–99)
Total Bilirubin: 0.4 mg/dL (ref 0.3–1.2)

## 2010-11-07 LAB — CBC
Hemoglobin: 13.1 g/dL (ref 12.0–15.0)
MCH: 28.1 pg (ref 26.0–34.0)
MCHC: 32.3 g/dL (ref 30.0–36.0)
MCV: 86.9 fL (ref 78.0–100.0)
RBC: 4.67 MIL/uL (ref 3.87–5.11)

## 2010-11-07 LAB — GLUCOSE, CAPILLARY
Glucose-Capillary: 114 mg/dL — ABNORMAL HIGH (ref 70–99)
Glucose-Capillary: 167 mg/dL — ABNORMAL HIGH (ref 70–99)
Glucose-Capillary: 155 mg/dL — ABNORMAL HIGH (ref 70–99)

## 2010-11-07 LAB — DIFFERENTIAL
Lymphs Abs: 2.7 10*3/uL (ref 0.7–4.0)
Monocytes Absolute: 1 10*3/uL (ref 0.1–1.0)
Monocytes Relative: 9 % (ref 3–12)
Neutro Abs: 7.9 10*3/uL — ABNORMAL HIGH (ref 1.7–7.7)
Neutrophils Relative %: 67 % (ref 43–77)

## 2010-11-08 LAB — GLUCOSE, CAPILLARY
Glucose-Capillary: 197 mg/dL — ABNORMAL HIGH (ref 70–99)
Glucose-Capillary: 241 mg/dL — ABNORMAL HIGH (ref 70–99)
Glucose-Capillary: 106 mg/dL — ABNORMAL HIGH (ref 70–99)

## 2010-11-09 LAB — GLUCOSE, CAPILLARY
Glucose-Capillary: 96 mg/dL (ref 70–99)
Glucose-Capillary: 126 mg/dL — ABNORMAL HIGH (ref 70–99)
Glucose-Capillary: 261 mg/dL — ABNORMAL HIGH (ref 70–99)

## 2010-11-09 LAB — DIFFERENTIAL
Basophils Relative: 0 % (ref 0–1)
Monocytes Absolute: 1.1 10*3/uL — ABNORMAL HIGH (ref 0.1–1.0)
Monocytes Relative: 9 % (ref 3–12)
Neutro Abs: 7.4 10*3/uL (ref 1.7–7.7)

## 2010-11-09 LAB — CBC
HCT: 41 % (ref 36.0–46.0)
Hemoglobin: 13.4 g/dL (ref 12.0–15.0)
MCH: 28.5 pg (ref 26.0–34.0)
MCHC: 32.7 g/dL (ref 30.0–36.0)

## 2010-11-09 LAB — COMPREHENSIVE METABOLIC PANEL
CO2: 29 mEq/L (ref 19–32)
Calcium: 8.9 mg/dL (ref 8.4–10.5)
Creatinine, Ser: 0.9 mg/dL (ref 0.4–1.2)
GFR calc Af Amer: >60 mL/min (ref >60)
GFR calc non Af Amer: >60 mL/min (ref >60)
Glucose, Bld: 95 mg/dL (ref 70–99)

## 2010-11-10 LAB — CBC
MCH: 28.3 pg (ref 26.0–34.0)
MCHC: 32.6 g/dL (ref 30.0–36.0)
Platelets: 347 10*3/uL (ref 150–400)

## 2010-11-10 LAB — DIFFERENTIAL
Basophils Relative: 0 % (ref 0–1)
Eosinophils Absolute: 0.2 10*3/uL (ref 0.0–0.7)
Monocytes Absolute: 1.3 10*3/uL — ABNORMAL HIGH (ref 0.1–1.0)
Monocytes Relative: 11 % (ref 3–12)

## 2010-11-10 LAB — COMPREHENSIVE METABOLIC PANEL
ALT: 24 U/L (ref 0–35)
Albumin: 3 g/dL — ABNORMAL LOW (ref 3.5–5.2)
Alkaline Phosphatase: 31 U/L — ABNORMAL LOW (ref 39–117)
BUN: 15 mg/dL (ref 6–23)
CO2: 28 mEq/L (ref 19–32)
GFR calc Af Amer: >60 mL/min (ref >60)
Glucose, Bld: 102 mg/dL — ABNORMAL HIGH (ref 70–99)
Total Bilirubin: 0.5 mg/dL (ref 0.3–1.2)
Total Protein: 5.8 g/dL — ABNORMAL LOW (ref 6.0–8.3)

## 2010-11-12 NOTE — H&P (Signed)
Patricia Mosley, Patricia Mosley                ACCOUNT NO.:  [id number]  MEDICAL RECORD NO.:  [id number]           PATIENT TYPE:  I  LOCATION:  5508                         FACILITY:  [facility]  PHYSICIAN:  Gaspar Garbe, M.D.DATE OF BIRTH:  Sep 01, 1934  DATE OF ADMISSION:  11/01/2010 DATE OF DISCHARGE:                             HISTORY & PHYSICAL   CHIEF COMPLAINT:  Lower abdominal pain.  HISTORY OF PRESENT ILLNESS:  The patient is a 77 year old white female with a history of diverticular abscess last year about the same time who has complained of approximately 2 weeks' worth of stomach upset and nausea.  She was prescribed oral omeprazole and did reasonably well over the course of the past week and a half, but had worsening of symptoms in the evening yesterday and this morning woke up and had a fair amount of blood in her bowel movements and an increased amount of pain in her abdomen.  She contacted our office at [facility] and was told to go directly to the emergency room, subsequently had lab work showing an elevated white count and a CAT scan showing evidence of sigmoid diverticulitis.  She required admission for this.  When seen, she was attended by her husband.  She indicated that she felt reasonably well with the injection of contrast and not becoming more nauseated.  On exam, she continued to have considerable pain.  She denies any fevers, chills at this time, however.  ALLERGIES:  Multiple including CODEINE, EPINEPHRINE, SULFA, PENICILLIN, DILAUDID, TALWIN, FLAGYL, LEVAQUIN, DOXYCYCLINE, ROBAXIN.  MEDICATIONS: 1. Calcium and vitamin D twice daily. 2. Folate 1 mg once daily. 3. Januvia 100 mg once daily. 4. Lorazepam 0.5 mg p.r.n. 5. Plaquenil 200 mg b.i.d. 6. Prednisone 7.5 mg daily. 7. Phenergan as needed. 8. Prozac 20 mg daily. 9. Boniva IV q.63-months. 10.Zantac 150 mg b.i.d. 11.Omeprazole 20 mg daily.  PAST MEDICAL HISTORY: 1. Rheumatoid  arthritis. 2. Fibromyalgia, currently on DMARD therapy.  The patient had been     [place]. 3. History of ongoing migraines. 4. Diabetes mellitus type 2. 5. Chronic kidney disease stage III. 6. Osteoporosis. 7. Anxiety/depression. 8. Vitamin D deficiency. 9. History of diverticular abscess in February 2011.  PAST SURGICAL HISTORY:  Tonsils, bladder sling, and appendectomy, D and C, cystoscopy, and prior dental surgeries.  SOCIAL HISTORY:  The patient lives in [place] with her husband.  She is married and has two sons.  She has an education of course just beyond [employer].  She is a nonsmoker, nondrinker.  FAMILY HISTORY:  Mother died at age 50.  Father died at age 71 of COPD. She had a sister who died of breast cancer at age 38 and brother who is alive with bipolar.  REVIEW OF SYSTEMS:  The patient denies fevers, chills, or sweats. Denies any headache and nausea, but had vomiting today.  She has 1 formed bowel movement, which is dark, positive blood seen in the toilet, and considerable abdominal pain with this as well.  Denies chest pain, shortness of breath, or new swelling, or rashes.  The patient is a full code.  PHYSICAL EXAMINATION:  VITAL SIGNS:  Temperature 99.2, pulse 102, respiratory rate 18, blood pressure 126/72. GENERAL:  No acute distress. HEENT:  Normocephalic, atraumatic.  PERRLA.  EOMI.  ENT is within normal limits. NECK:  Supple.  No lymphadenopathy, JVD, or bruit. HEART:  Regular rate rhythm.  No murmur, rub, or gallop. LUNGS:  Clear to auscultation bilaterally. ABDOMEN:  The patient has soft abdomen, it is tender below the umbilicus, and the pain is now radiating down into her pubis.  She has mild rebound tenderness without guarding.  No organomegaly or masses are appreciated. RECTAL:  The patient is guaiac negative per the emergency room initial evaluation. EXTREMITIES:  No clubbing, cyanosis, or edema.  The patient has changes consistent with  rheumatoid. NEURO:  The patient is oriented to person, place, and time and she has no focal deficits.  IMAGING:  CAT scan shows sigmoid stranding consistent with diverticulitis.  No abscess seen.  EKG nonacute.  LABORATORY DATA:  White count 17.2, hemoglobin 13.6, hematocrit 41.4, platelets 355.  BUN and creatinine are 9 and 1.0 respectively.  Glucose elevated at 161, otherwise electrolytes are within normal limits. Urinalysis negative for infection.  Fecal occult blood testing was positive.  ASSESSMENT/PLAN: 1. Diverticulitis.  The patient has history of abscesses and history     of immunosuppression.  Inpatient admission is warranted.  We will     start her on ertapenem 1 g IV daily.  She has been on before.  Note     her multiple medication allergies.  We will use morphine for pain     as she is able to tolerate this as well.  I believe she has     received some in the emergency room already.  We will keep her on     an IV PPI and start her with a clear liquid diet and reexamine her     abdomen in the morning.  If she has worsening pain, may consider     treating her or getting GI to see her as Dr. Arlyce Dice had a     colonoscopy in September, which is fairly unremarkable except for     polyps. 2. Diabetes mellitus type 2.  Continue sliding scale as needed and     Januvia. 3. Rheumatoid arthritis.  We will stress dose her steroids to 20 mg of     prednisone daily.  We will continue her on her Plaquenil and follow     fever curve. 4. Fibromyalgia.  Elevated steroids may help some of this as some of     this may be rheumatoid arthritis related.  Her pain medication     should cover this as well. 5. Depression/anxiety.  We will continue her Prozac and lorazepam     which she can receive IV if she is having too much nausea. 6. We will use Phenergan for nausea as this worked well for her as     well and she has tolerated it in the past and also take it on an as-     needed basis at  home. 7. We will use Lovenox for DVT prophylaxis.     Gaspar Garbe, M.D.     RWT/MEDQ  D:  11/01/2010  T:  11/02/2010  Job:  [id number]  cc:   Barbette Hair. Arlyce Dice, MD,FACG Mark A. Perini, M.D.  Electronically Signed by Guerry Bruin M.D. on 11/05/2010 01:50:15 PM

## 2010-12-02 NOTE — Discharge Summary (Signed)
Patricia Mosley, Patricia Mosley                ACCOUNT NO.:  [id number]  MEDICAL RECORD NO.:  [id number]           PATIENT TYPE:  I  LOCATION:  5508                         FACILITY:  [facility]  PHYSICIAN:  Gwen Pounds, MD       DATE OF BIRTH:  13-Jan-1934  DATE OF ADMISSION:  11/01/2010 DATE OF DISCHARGE:  11/10/2010                              DISCHARGE SUMMARY   DISCHARGE DIAGNOSES: 1. Recurrent diverticulitis. 2. Diabetes mellitus type 2. 3. Rheumatoid arthritis. 4. Fibromyalgia. 5. Depression and anxiety. 6. History of migraines. 7. Chronic kidney disease, stage III. 8. Osteoporosis. 9. Vitamin D deficiency. 10.History of diverticular abscess in February 2011. 11.History of tonsillectomy. 12.History of bladder sling. 13.History of appendectomy. 14.History of dilation and curettage. 15.History of cystoscopy. 16.History of prior dental surgeries 17.Gastroesophageal reflux disease. 18.Numerous and multiple drug intolerances.  DISCHARGE MEDICATION:  List includes: 1. Tylenol 325-650 mg every 6 hours as needed. 2. Align over-the-counter probiotic 1 tablet by mouth for a full 30-     day course. 3. Ensure 1 can by mouth twice daily with meals. 4. Boniva 3 mg 1 injection IV quarterly. 5. Calcium carbonate 1 tablet by mouth daily. 6. Paroxetine 20 mg p.o. daily. 7. Folic acid 1 mg tablet by mouth twice daily. 8. Plaquenil 200 mg by mouth twice daily. 9. Januvia 50 mg by mouth daily. 10.Lorazepam 0.5 mg by mouth at bedtime. 11.Prednisone 7.5 mg every morning. 12.Vitamin D3 1000 units 3 times a day. 13.Omeprazole 20 mg daily.  DISCHARGE PROCEDURES:  Include CT scan of the abdomen and pelvis dated November 01, 2010, showing abnormal wall thickening and mesenteric stranding in the area of the sigmoid colon from acute diverticulitis. No free air or abscess identified.  Large hiatal hernia.  Partial duplication of left ureter was noted.  HISTORY OF PRESENT ILLNESS:  Briefly, [name] is a 76 year old white female with history of diverticular disease and diverticular abscess in February 2011, who presented to medical attention with 2 weeks worth of GI issues including stomach upset and nausea.  She was initially put on omeprazole, which seemed to help but her symptoms progressed despite the medication.  On the day of admission, she had hematochezia.  She called the office and was sent to the emergency department.  Workup in the emergency room showed elevated white count and a CAT scan compatible with sigmoid diverticulitis.  She was subsequently admitted for further evaluation and treatment.  HOSPITAL COURSE:  [name] was admitted through the emergency department on November 02, 2010 with acute diverticulitis.  Temperature 99.2, pulse 102, blood pressure was stable.  Abdomen was soft, but tender below the umbilicus and pain was radiating into the pubic area. She had mild rebound tenderness without guarding in the left lower quadrant, and she was guaiac negative per the emergency department.  CT scan was compatible with sigmoid diverticulitis.  There was no abscesses seen.  White count was 17.2 and later in Dr. Deneen Harts note, it says that fecal occult blood testing eventually became positive.  She again was admitted to 5508 at [facility], and because  she was immunosuppressed and otherwise sick with this, she was started on ertapenem 1 g IV daily.  Morphine was used for pain control.  IV fluids were given.  She was kept on proton pump inhibitors, and there was some consideration of getting Dr. Arlyce Dice from GI involved if necessary.  She did have a colonoscopy in September 2011, which was fairly unremarkable.  Other issues dealt with throughout the hospital stay was her diabetes mellitus type 2 for which, she was continued on her Januvia and insulin sliding scale.  For her rheumatoid arthritis, she was given stress dose steroids and continued on  her Plaquenil.  For her fibromyalgia, the elevated steroids were considered to help some.  For her underlying anxiety and depression, she was continued on her Prozac and lorazepam and she did well with that and she was treated with IV Phenergan for her underlying nausea.  DVT prophylaxis was given with Lovenox.  She progressively improved throughout her hospital stay and it was elected that she would complete a full course of IV antibiotics in the hospital due to the fact that she has got significant drug allergies and drug intolerances.  Her medications were titrated back to her prehospital dosing and on Sunday, November 10, 2010, all her vital signs are stable.  All her labs were stable.  Her blood sugars were stable and she was deemed medically ready for discharge.  Current laboratory data includes a CMET with sodium 141, potassium 4.3, chloride 107, bicarb 28, BUN 15, creatinine 0.96, glucose 102.  Liver tests are within normal limits except for a low albumin at 3.0.  CBC shows white count of 12.2, hemoglobin 12.7, platelet count 347 with no left shift.  A1c was 7.1%.  Urinalysis was done initially on admission, which was negative.  Again, she was discharged home in stable condition on November 10, 2010 with instructions to follow up with Dr. Waynard Edwards on Wednesday as directed, and she does have a bone density scan planned for that day as well.     Gwen Pounds, MD     JMR/MEDQ  D:  11/10/2010  T:  11/11/2010  Job:  [id number]  Electronically Signed by Creola Corn MD on 12/02/2010 09:11:54 PM

## 2010-12-11 ENCOUNTER — Encounter (HOSPITAL_COMMUNITY): Payer: Medicare Other | Attending: Internal Medicine

## 2010-12-11 DIAGNOSIS — M810 Age-related osteoporosis without current pathological fracture: Secondary | ICD-10-CM | POA: Insufficient documentation

## 2010-12-14 LAB — BASIC METABOLIC PANEL
CO2: 27 mEq/L (ref 19–32)
Chloride: 103 mEq/L (ref 96–112)
GFR calc Af Amer: >60 mL/min (ref >60)
Potassium: 3.6 mEq/L (ref 3.5–5.1)
Sodium: 139 mEq/L (ref 135–145)

## 2010-12-14 LAB — CBC
HCT: 40 % (ref 36.0–46.0)
Hemoglobin: 13.5 g/dL (ref 12.0–15.0)
MCH: 30.1 pg (ref 26.0–34.0)
MCV: 89.7 fL (ref 78.0–100.0)
RBC: 4.47 MIL/uL (ref 3.87–5.11)

## 2010-12-14 LAB — POCT CARDIAC MARKERS
CKMB, poc: 1 ng/mL (ref 1.0–8.0)
Myoglobin, poc: 91.1 ng/mL (ref 12–200)
Troponin i, poc: <0.05 ng/mL (ref 0.00–0.09)
Troponin i, poc: <0.05 ng/mL (ref 0.00–0.09)

## 2010-12-15 LAB — HEPATIC FUNCTION PANEL
ALT: 19 U/L (ref 0–35)
Indirect Bilirubin: 0.7 mg/dL (ref 0.3–0.9)
Total Protein: 6.4 g/dL (ref 6.0–8.3)

## 2010-12-15 LAB — DIFFERENTIAL
Basophils Absolute: 0.1 10*3/uL (ref 0.0–0.1)
Eosinophils Absolute: 0 10*3/uL (ref 0.0–0.7)
Eosinophils Relative: 0 % (ref 0–5)
Lymphocytes Relative: 3 % — ABNORMAL LOW (ref 12–46)

## 2010-12-15 LAB — URINALYSIS, ROUTINE W REFLEX MICROSCOPIC
Nitrite: NEGATIVE
Protein, ur: NEGATIVE mg/dL
Urobilinogen, UA: 0.2 mg/dL (ref 0.0–1.0)

## 2010-12-15 LAB — HEMOCCULT GUIAC POC 1CARD (OFFICE): Fecal Occult Bld: NEGATIVE

## 2010-12-15 LAB — BASIC METABOLIC PANEL
BUN: 14 mg/dL (ref 6–23)
Chloride: 103 mEq/L (ref 96–112)
Creatinine, Ser: 1.05 mg/dL (ref 0.4–1.2)

## 2010-12-15 LAB — CBC
MCV: 88.4 fL (ref 78.0–100.0)
Platelets: 337 10*3/uL (ref 150–400)
RDW: 14.2 % (ref 11.5–15.5)
WBC: 17 10*3/uL — ABNORMAL HIGH (ref 4.0–10.5)

## 2010-12-15 LAB — LIPASE, BLOOD: Lipase: 33 U/L (ref 11–59)

## 2010-12-18 LAB — DIFFERENTIAL
Basophils Absolute: 0 10*3/uL (ref 0.0–0.1)
Basophils Absolute: 0.1 10*3/uL (ref 0.0–0.1)
Basophils Relative: 0 % (ref 0–1)
Basophils Relative: 0 % (ref 0–1)
Basophils Relative: 1 % (ref 0–1)
Basophils Relative: 1 % (ref 0–1)
Eosinophils Absolute: 0 10*3/uL (ref 0.0–0.7)
Eosinophils Absolute: 0 10*3/uL (ref 0.0–0.7)
Eosinophils Absolute: 0 10*3/uL (ref 0.0–0.7)
Eosinophils Relative: 0 % (ref 0–5)
Eosinophils Relative: 1 % (ref 0–5)
Eosinophils Relative: 1 % (ref 0–5)
Lymphocytes Relative: 14 % (ref 12–46)
Lymphocytes Relative: 22 % (ref 12–46)
Lymphocytes Relative: 26 % (ref 12–46)
Lymphocytes Relative: 28 % (ref 12–46)
Lymphs Abs: 0.6 10*3/uL — ABNORMAL LOW (ref 0.7–4.0)
Lymphs Abs: 1.3 10*3/uL (ref 0.7–4.0)
Lymphs Abs: 1.7 10*3/uL (ref 0.7–4.0)
Lymphs Abs: 1.8 10*3/uL (ref 0.7–4.0)
Lymphs Abs: 1.8 10*3/uL (ref 0.7–4.0)
Lymphs Abs: 2.2 10*3/uL (ref 0.7–4.0)
Monocytes Absolute: 0.7 10*3/uL (ref 0.1–1.0)
Monocytes Absolute: 0.8 10*3/uL (ref 0.1–1.0)
Monocytes Absolute: 0.9 10*3/uL (ref 0.1–1.0)
Monocytes Relative: 10 % (ref 3–12)
Monocytes Relative: 10 % (ref 3–12)
Monocytes Relative: 4 % (ref 3–12)
Monocytes Relative: 9 % (ref 3–12)
Neutro Abs: 16 10*3/uL — ABNORMAL HIGH (ref 1.7–7.7)
Neutro Abs: 5.5 10*3/uL (ref 1.7–7.7)
Neutrophils Relative %: 61 % (ref 43–77)
Neutrophils Relative %: 62 % (ref 43–77)
Neutrophils Relative %: 65 % (ref 43–77)
Neutrophils Relative %: 67 % (ref 43–77)
Neutrophils Relative %: 72 % (ref 43–77)
Neutrophils Relative %: 75 % (ref 43–77)

## 2010-12-18 LAB — GLUCOSE, CAPILLARY
Glucose-Capillary: 106 mg/dL — ABNORMAL HIGH (ref 70–99)
Glucose-Capillary: 107 mg/dL — ABNORMAL HIGH (ref 70–99)
Glucose-Capillary: 109 mg/dL — ABNORMAL HIGH (ref 70–99)
Glucose-Capillary: 115 mg/dL — ABNORMAL HIGH (ref 70–99)
Glucose-Capillary: 117 mg/dL — ABNORMAL HIGH (ref 70–99)
Glucose-Capillary: 117 mg/dL — ABNORMAL HIGH (ref 70–99)
Glucose-Capillary: 120 mg/dL — ABNORMAL HIGH (ref 70–99)
Glucose-Capillary: 127 mg/dL — ABNORMAL HIGH (ref 70–99)
Glucose-Capillary: 131 mg/dL — ABNORMAL HIGH (ref 70–99)
Glucose-Capillary: 132 mg/dL — ABNORMAL HIGH (ref 70–99)
Glucose-Capillary: 136 mg/dL — ABNORMAL HIGH (ref 70–99)
Glucose-Capillary: 141 mg/dL — ABNORMAL HIGH (ref 70–99)
Glucose-Capillary: 143 mg/dL — ABNORMAL HIGH (ref 70–99)
Glucose-Capillary: 144 mg/dL — ABNORMAL HIGH (ref 70–99)
Glucose-Capillary: 144 mg/dL — ABNORMAL HIGH (ref 70–99)
Glucose-Capillary: 146 mg/dL — ABNORMAL HIGH (ref 70–99)
Glucose-Capillary: 148 mg/dL — ABNORMAL HIGH (ref 70–99)
Glucose-Capillary: 152 mg/dL — ABNORMAL HIGH (ref 70–99)
Glucose-Capillary: 155 mg/dL — ABNORMAL HIGH (ref 70–99)
Glucose-Capillary: 155 mg/dL — ABNORMAL HIGH (ref 70–99)
Glucose-Capillary: 157 mg/dL — ABNORMAL HIGH (ref 70–99)
Glucose-Capillary: 162 mg/dL — ABNORMAL HIGH (ref 70–99)
Glucose-Capillary: 180 mg/dL — ABNORMAL HIGH (ref 70–99)
Glucose-Capillary: 192 mg/dL — ABNORMAL HIGH (ref 70–99)
Glucose-Capillary: 203 mg/dL — ABNORMAL HIGH (ref 70–99)
Glucose-Capillary: 94 mg/dL (ref 70–99)

## 2010-12-18 LAB — CBC
HCT: 38.5 % (ref 36.0–46.0)
HCT: 38.6 % (ref 36.0–46.0)
HCT: 40.1 % (ref 36.0–46.0)
Hemoglobin: 12.8 g/dL (ref 12.0–15.0)
Hemoglobin: 12.8 g/dL (ref 12.0–15.0)
Hemoglobin: 13 g/dL (ref 12.0–15.0)
Hemoglobin: 13 g/dL (ref 12.0–15.0)
Hemoglobin: 13.5 g/dL (ref 12.0–15.0)
MCHC: 33.2 g/dL (ref 30.0–36.0)
MCHC: 33.6 g/dL (ref 30.0–36.0)
MCHC: 33.6 g/dL (ref 30.0–36.0)
MCHC: 33.7 g/dL (ref 30.0–36.0)
MCHC: 33.7 g/dL (ref 30.0–36.0)
MCHC: 34 g/dL (ref 30.0–36.0)
MCV: 88.4 fL (ref 78.0–100.0)
MCV: 89 fL (ref 78.0–100.0)
MCV: 89.3 fL (ref 78.0–100.0)
MCV: 89.4 fL (ref 78.0–100.0)
MCV: 89.6 fL (ref 78.0–100.0)
Platelets: 322 10*3/uL (ref 150–400)
Platelets: 327 10*3/uL (ref 150–400)
Platelets: 367 10*3/uL (ref 150–400)
Platelets: 375 10*3/uL (ref 150–400)
RBC: 4.3 MIL/uL (ref 3.87–5.11)
RBC: 4.32 MIL/uL (ref 3.87–5.11)
RBC: 4.32 MIL/uL (ref 3.87–5.11)
RBC: 4.53 MIL/uL (ref 3.87–5.11)
RDW: 12.9 % (ref 11.5–15.5)
RDW: 13 % (ref 11.5–15.5)
RDW: 13.1 % (ref 11.5–15.5)
RDW: 13.1 % (ref 11.5–15.5)
RDW: 13.2 % (ref 11.5–15.5)
WBC: 8.5 10*3/uL (ref 4.0–10.5)
WBC: 8.6 10*3/uL (ref 4.0–10.5)

## 2010-12-18 LAB — COMPREHENSIVE METABOLIC PANEL
ALT: 15 U/L (ref 0–35)
ALT: 15 U/L (ref 0–35)
ALT: 18 U/L (ref 0–35)
ALT: 22 U/L (ref 0–35)
ALT: 27 U/L (ref 0–35)
AST: 21 U/L (ref 0–37)
AST: 29 U/L (ref 0–37)
AST: 41 U/L — ABNORMAL HIGH (ref 0–37)
AST: 43 U/L — ABNORMAL HIGH (ref 0–37)
Albumin: 2.7 g/dL — ABNORMAL LOW (ref 3.5–5.2)
Albumin: 2.8 g/dL — ABNORMAL LOW (ref 3.5–5.2)
Albumin: 3.4 g/dL — ABNORMAL LOW (ref 3.5–5.2)
Alkaline Phosphatase: 41 U/L (ref 39–117)
Alkaline Phosphatase: 43 U/L (ref 39–117)
Alkaline Phosphatase: 48 U/L (ref 39–117)
BUN: 10 mg/dL (ref 6–23)
BUN: 12 mg/dL (ref 6–23)
BUN: 5 mg/dL — ABNORMAL LOW (ref 6–23)
BUN: 5 mg/dL — ABNORMAL LOW (ref 6–23)
CO2: 26 mEq/L (ref 19–32)
CO2: 27 mEq/L (ref 19–32)
CO2: 28 mEq/L (ref 19–32)
CO2: 30 mEq/L (ref 19–32)
Calcium: 7.6 mg/dL — ABNORMAL LOW (ref 8.4–10.5)
Calcium: 8 mg/dL — ABNORMAL LOW (ref 8.4–10.5)
Calcium: 8.1 mg/dL — ABNORMAL LOW (ref 8.4–10.5)
Calcium: 8.2 mg/dL — ABNORMAL LOW (ref 8.4–10.5)
Calcium: 8.5 mg/dL (ref 8.4–10.5)
Calcium: 8.9 mg/dL (ref 8.4–10.5)
Chloride: 103 mEq/L (ref 96–112)
Chloride: 104 mEq/L (ref 96–112)
Creatinine, Ser: 0.8 mg/dL (ref 0.4–1.2)
Creatinine, Ser: 0.83 mg/dL (ref 0.4–1.2)
Creatinine, Ser: 0.84 mg/dL (ref 0.4–1.2)
Creatinine, Ser: 0.97 mg/dL (ref 0.4–1.2)
GFR calc Af Amer: >60 mL/min (ref >60)
GFR calc Af Amer: >60 mL/min (ref >60)
GFR calc Af Amer: >60 mL/min (ref >60)
GFR calc Af Amer: >60 mL/min (ref >60)
GFR calc non Af Amer: 56 mL/min — ABNORMAL LOW (ref >60)
GFR calc non Af Amer: >60 mL/min (ref >60)
GFR calc non Af Amer: >60 mL/min (ref >60)
GFR calc non Af Amer: >60 mL/min (ref >60)
GFR calc non Af Amer: >60 mL/min (ref >60)
Glucose, Bld: 112 mg/dL — ABNORMAL HIGH (ref 70–99)
Glucose, Bld: 129 mg/dL — ABNORMAL HIGH (ref 70–99)
Glucose, Bld: 130 mg/dL — ABNORMAL HIGH (ref 70–99)
Glucose, Bld: 139 mg/dL — ABNORMAL HIGH (ref 70–99)
Glucose, Bld: 142 mg/dL — ABNORMAL HIGH (ref 70–99)
Potassium: 3.6 mEq/L (ref 3.5–5.1)
Potassium: 3.8 mEq/L (ref 3.5–5.1)
Potassium: 3.9 mEq/L (ref 3.5–5.1)
Sodium: 133 mEq/L — ABNORMAL LOW (ref 135–145)
Sodium: 133 mEq/L — ABNORMAL LOW (ref 135–145)
Sodium: 136 mEq/L (ref 135–145)
Sodium: 137 mEq/L (ref 135–145)
Sodium: 140 mEq/L (ref 135–145)
Total Bilirubin: 0.5 mg/dL (ref 0.3–1.2)
Total Bilirubin: 0.6 mg/dL (ref 0.3–1.2)
Total Protein: 5.7 g/dL — ABNORMAL LOW (ref 6.0–8.3)
Total Protein: 5.8 g/dL — ABNORMAL LOW (ref 6.0–8.3)
Total Protein: 6 g/dL (ref 6.0–8.3)
Total Protein: 6.2 g/dL (ref 6.0–8.3)
Total Protein: 6.9 g/dL (ref 6.0–8.3)

## 2010-12-18 LAB — URINALYSIS, ROUTINE W REFLEX MICROSCOPIC
Bilirubin Urine: NEGATIVE
Nitrite: NEGATIVE
Specific Gravity, Urine: 1.002 — ABNORMAL LOW (ref 1.005–1.030)
Urobilinogen, UA: 0.2 mg/dL (ref 0.0–1.0)
pH: 6.5 (ref 5.0–8.0)

## 2010-12-18 LAB — CLOSTRIDIUM DIFFICILE EIA
C difficile Toxins A+B, EIA: NEGATIVE
C difficile Toxins A+B, EIA: NEGATIVE

## 2010-12-18 LAB — HEMOGLOBIN A1C
Hgb A1c MFr Bld: 6.6 % — ABNORMAL HIGH (ref 4.6–6.1)
Mean Plasma Glucose: 143 mg/dL

## 2010-12-18 LAB — SAMPLE TO BLOOD BANK

## 2010-12-18 LAB — HEMOCCULT GUIAC POC 1CARD (OFFICE): Fecal Occult Bld: POSITIVE

## 2010-12-18 LAB — PHOSPHORUS: Phosphorus: 2.7 mg/dL (ref 2.3–4.6)

## 2011-01-16 ENCOUNTER — Ambulatory Visit: Payer: Medicare Other | Admitting: Gastroenterology

## 2011-02-11 NOTE — Discharge Summary (Signed)
[name], [name]                ACCOUNT NO.:  [id number]   MEDICAL RECORD NO.:  [id number]          PATIENT TYPE:  INP   LOCATION:  2038                         FACILITY:  [facility]   PHYSICIAN:  Antonieta Iba, MD   DATE OF BIRTH:  April 25, 1934   DATE OF ADMISSION:  09/21/2008  DATE OF DISCHARGE:  09/25/2008                               DISCHARGE SUMMARY   DISCHARGE DIAGNOSES:  1. Noncardiac chest pain status post cardiac catheterization with no      obstructive disease.  She only had a little 20% in her proximal      right coronary artery.  2. Hypertension.  On admission, her blood pressure was 150/80.  She      stated that it had been going up and down recently.  We initially      placed her on low-dose lisinopril and metoprolol.  Her metoprolol      had to be decreased because of her heart rate.  After cardiac      catheterization, it was decided to discontinue her metoprolol and      continue her on lisinopril.  3. Mild dyslipidemia.  LDL is 118, HDL 84, triglycerides 60, and total      cholesterol 214.  It was decided because of her diabetes to place      her on Crestor 10 mg.  4. Non-insulin-dependent diabetes mellitus.  5. Rheumatoid arthritis, on chronic prednisone.  6. Large hiatal hernia on chest x-ray.  7. Ejection fraction of 50-55%.   PROCEDURES:  Cardiac catheterization on September 25, 2008, showing only  20% proximal RCA.  Cath performed by Dr. Mariah Milling.   LABORATORY DATA:  Hemoglobin 13.2, hematocrit 39.8, WBCs 10.6, and  platelets 328.  Sodium 138, potassium 3.9, BUN 15, creatinine 1.1, and  glucose 84.  Urine culture showed 10,000 colonies of multiple bacteria.  CK, MBs, and troponins were all negative.  Hemoglobin A1c was 6.5.  Total cholesterol is 214, triglycerides 60, HDL 84, and LDL 118.  TSH  was 1.688.  Magnesium was at 2.4.  Chest x-ray showed a large hiatal  hernia.  No active cardiopulmonary disease.   HOSPITAL COURSE:  Patricia Mosley is a 77 year old  married female who came  into the emergency room with complaints of an elephant sitting on her  chest with dizziness and mild shortness of breath.  She stated also  recently her blood pressures had been going up and down, and she was  lightheaded with this.  It was decided to admit her.  She was placed on  IV heparin.  She was ruled out for an MI.  She underwent cardiac  catheterization, September 25, 2008, which showed no significant coronary  artery disease.  Her EF was 50-55% and it was decided that she could be  followed by her primary care doctor, Dr. Waynard Edwards.   DISCHARGE MEDICATIONS:  1. Boniva monthly.  2. Januvia half of 100 mg daily.  3. Plaquenil 200 mg twice a day.  4. Prednisone 7.5 mg a day.  5. Ranitidine 150 mg twice a day.  6. Fluoxetine 20  mg every day.  7. Folic acid 1 mg twice a day.  8. Vitamin D 1000 mg daily.  9. Calcium 600 plus D twice a day.  10.Lorazepam 0.5 mg at bedtime.   We added lisinopril 10 mg a day and Crestor 10 mg a day.   She was told not to do any strenuous activity, lifting, pushing,  pulling, or extended walking for a week and not to drive for 2 days.  If  she has any problems with her groin, she can call.  She is to follow up  with Dr. Waynard Edwards.  She already has an appointment to see him, I believe,  in January 5.  She has no need to follow up with cardiologist now unless  she has other heart or vascular issues.  As stated above, she can call  if she has problems with her groin.      Patricia Mosley, N.P.      Antonieta Iba, MD  Electronically Signed    BB/MEDQ  D:  09/25/2008  T:  09/26/2008  Job:  [id number]   cc:   Loraine Leriche A. Perini, M.D.

## 2011-02-11 NOTE — Cardiovascular Report (Signed)
[name], Patricia Mosley                ACCOUNT NO.:  [id number]   MEDICAL RECORD NO.:  [id number]          PATIENT TYPE:  INP   LOCATION:  2038                         FACILITY:  [facility]   PHYSICIAN:  Antonieta Iba, MD   DATE OF BIRTH:  1934-01-02   DATE OF PROCEDURE:  09/21/2008  DATE OF DISCHARGE:  09/25/2008                            CARDIAC CATHETERIZATION   REFERRING PHYSICIAN:  Richard A. Alanda Amass, MD   PERFORMING PHYSICIAN:  Antonieta Iba, MD   INDICATIONS FOR PROCEDURE:  Ms. Heidi Maclin is a very pleasant 77year-  old woman with a history of diabetes, hypertension with recent episodes  of chest discomfort.  She presents for further evaluation of her chest  pain with cardiac cath.   PROCEDURE DETAILS:  The risks and benefits of the procedure were  discussed with the patient and informed consent was obtained.  She was  brought to the Cardiac Catheterization Lab and prepped and draped in  usual sterile fashion.  The modified Seldinger technique was used to  cannulate the right femoral artery and a 6-French catheter was inserted.  A 6-French Judkins left #4 and right #4 catheters were used to engage  the left main and right coronary artery respectively.  Hand injections  of contrast were used to visualize the coronary anatomy and these were  recorded on cinematography.  A 6-French pigtail catheter was used to  cross the aortic valve into the left ventricle and LV gram was obtained.  The catheter and sheath were removed at the end of the case and manual  compression was performed and hemostasis obtained.  No complications  were reported.   CORONARY ANATOMY:  Left main:  The left main is a moderate-to-large  sized vessel that bifurcates into the LAD and left circumflex.  There is  no significant disease noted.   Left anterior descending:  The LAD is a moderate-to-large sized vessel  that wraps around the apex.  There is one diagonal vessel that is  branching with no  significant disease.  LAD has no significant disease.   Left circumflex:  The left circumflex is a moderate-to-large sized  vessel that has two obtuse marginal branches and an AV groove  circumflex.  There is no significant disease noted.   Right coronary artery:  The RCA is a dominant long vessel that extends  distally and has a long PL branch with a small PDA branch.  There is  mild proximal RCA disease estimated at 20%.  No other significant  disease noted.   LV gram shows ejection fraction of 50-55% with no aortic stenosis on  pullback.  There is trace mitral regurgitation.   FINAL IMPRESSION:  Right dominant coronary artery system with only mild  disease noted in the proximal right coronary artery.  Etiology of the  patient's chest pain is likely noncardiac.  She will follow up with Dr.  Alanda Amass for further medical management in the next week or two.      Antonieta Iba, MD  Electronically Signed     TJG/MEDQ  D:  09/27/2008  T:  09/28/2008  Job:  [id number]

## 2011-02-14 NOTE — Discharge Summary (Signed)
NAMEBRISA, AUTH                ACCOUNT NO.:  [id number]   MEDICAL RECORD NO.:  [id number]          PATIENT TYPE:  INP   LOCATION:  6703                         FACILITY:  [facility]   PHYSICIAN:  Mark A. Perini, M.D.   DATE OF BIRTH:  1934/05/13   DATE OF ADMISSION:  07/04/2006  DATE OF DISCHARGE:  07/08/2006                                 DISCHARGE SUMMARY   DISCHARGE DIAGNOSES:  1. Colitis, possibly ischemic colitis.  2. Rheumatoid arthritis.  3. Severe osteoporosis.  4. Nonhealing ulcer/abscess in mouth.  5. Chronic anxiety and panic disorder.  6. Cryoglobulinemia.  7. Fibromyalgia.   HISTORY OF PRESENT ILLNESS:  [name] is a pleasant, 77 year old female  who has been treated with doxycycline for Actinomyces which grew from an  oral culture.  She has been dealing with a nonhealing area of her gum for  several months.  In the last few days prior to admission, she developed  worsening abdominal cramping and pain with small amount of rectal bleeding.  She is admitted for further care.   HOSPITAL COURSE:  CT scan of the abdomen and pelvis showed some thickening  and stranding in the rectosigmoid area consistent with a mild colitis.  It  was felt that she might have had an ischemic colitis.  She had no further GI  bleeding.  GI gastroenterology was consulted and followed the patient.  She  was treated empirically with IV Flagyl and IV fluids.  With these measures,  her symptoms gradually improved.  It was felt that she could not tolerate  the doxycycline due to side effects and therefore she was discharged on oral  Flagyl to complete a full course of this.  On July 08, 2006, she was  deemed stable for discharge home.   DISCHARGE PHYSICAL EXAM:  VITAL SIGNS:  She was afebrile, vital signs are  stable.  GENERAL:  She is in no acute distress.  LUNGS: Clear to auscultation bilaterally.  There were no wheezes, rales or  rhonchi.  HEART:  Heart was regular rate and rhythm with  no murmur.  ABDOMEN:  Soft and benign, nontender.  There was no edema.   DISCHARGE LABORATORY DATA:  On July 08, 2006, white count was 10.7,  hemoglobin 13.4, platelet count 374,000.  There were 60% segs, 29%  lymphocytes and 10% monocytes.  INR was 1.1 on admission.  PTT was 26.  On  July 08, 2006, sodium was 140, potassium 4.0, chloride 107, CO2 27, BUN  8, creatinine 1.1, glucose 115, calcium 8.6, albumin 2.8, phosphorus 3.6.  A  25-hydroxy vitamin D level was pending at the time of discharge.  Urinalysis  was essentially negative on admission.  C. difficile toxin x2 was negative  during this stay.   DISCHARGE MEDICATIONS:  1. Citracal with D twice daily with food.  2. She is to stop doxycycline.  3. She is to hold her Humira until Dr. Phylliss Bob advises her to restart this.  4. Continue folic acid 1 mg once daily.  5. Zantac 150 mg twice a day.  6. Plaquenil 200 mg twice  a day.  7. Forteo 20 mcg shot once daily.  8. Paroxetine 20 mg once daily.  9. Lorazepam 0.5 mg each evening.  10.Prednisone 10 mg daily.  11.Tylenol as needed 500 mg three times a day for the next 8 days.   DISCHARGE INSTRUCTIONS:  She is to follow a low fiber diet, low residue  diet.  She is to keep well hydrated.   SPECIAL INSTRUCTIONS:  She is to call if there are any recurrent problems.   FOLLOW UP:  She will follow up in my office in 1 week.  She is to follow up  with GI as needed.  She is to follow up with her oral surgeon and  rheumatologist as previously ordered.  I am not sure what the best  antibiotic course will be given her recent oral abscess issues.  We may need  some input from infectious disease at some point on this issue.           ______________________________  Redge Gainer. Waynard Edwards, M.D.     MAP/MEDQ  D:  07/11/2006  T:  07/13/2006  Job:  [id number]   cc:   Areatha Keas, M.D.  Barbette Hair. Arlyce Dice, MD,FACG  Waynard Edwards, D.D.S.

## 2011-02-14 NOTE — H&P (Signed)
NAME:  Patricia Mosley, Patricia Mosley                          ACCOUNT NO.:  [id number]   MEDICAL RECORD NO.:  [id number]                   PATIENT TYPE:  INP   LOCATION:  5729                                 FACILITY:  [facility]   PHYSICIAN:  Mark A. Perini, M.D.                DATE OF BIRTH:  Sep 14, 1934   DATE OF ADMISSION:  08/02/2003  DATE OF DISCHARGE:                                HISTORY & PHYSICAL   CHIEF COMPLAINT:  1. Diarrhea.  2. Abdominal pain.  3. Bright red bleeding per rectum.   HISTORY OF PRESENT ILLNESS:  The patient is a 77 year old female with a  history of rheumatoid arthritis, depression, anxiety and panic disorder who  presents with a five-day history of malaise and 1-1/2 day history of  diarrhea.  She started having multiple formed stools starting the morning of  November 2 that progressed to loose stools with mucus and noticed trace  blood about 10 o'clock yesterday evening.  She is now passing some clots of  blood.  She had eight to ten bowel movements over the last 24 hours.  She  has had four since arriving in the emergency room at four this morning.  She  has had some copious amounts of small bowel movements as well.  She does  report some abdominal pain since yesterday evening which was relieved  somewhat with bowel movements.  She denies any tenesmus.  She denies a  history of bright red bleeding per rectum or hemorrhoids.  She does have  some chronic nausea without vomiting.  She denies any fevers or chills or  any sick contacts.  She did eat oyster stew on October 30.  The patient and  her husband have been eating a lot of take-out foods lately.  She currently  does have a little bit of a headache and is more emotional than her normal  state.  She has received in the emergency room, Phenergan, Ativan and  Tylenol.  As part of her emergency room workup, she underwent a CT scan of  her abdomen which did show descending colitis from the splenic flexure  downward.  She  will require admission for further care.   PAST MEDICAL HISTORY:  1. Autoimmune disorder with rheumatoid factor positive.  2. Rheumatoid arthritis, initially presenting with multiple intermittent     episodes of joint swelling and cryoglobulinemia.  3. Depression.  4. Anxiety and panic attacks.   MEDICATIONS:  1. Prozac 20 mg daily.  2. Prednisone 12.5 mg daily.  3. Vitamin D and calcium.  4. Plaquenil 200 mg b.i.d.  5. Actonel which was recently discontinued.  6. Folic acid, 1 mg daily.  7. Prevacid was recently discontinued.  8. She has had doses of Remicade on September 27, October 12, and next dose     will be on November 11.  9. Zantac 75 as needed.  10.  Clonazepam 0.5 mg b.i.d.   ALLERGIES AND DRUG INTOLERANCES:  CODEINE, EPINEPHRINE, SULFA, PENICILLIN,  DEMEROL, TALWIN, ROBAXIN, NEURONTIN, ESTROGEN, QUININE, DEXTROMETHORPHAN,  ALLER GRA AND FLORES EYEDROPS   SOCIAL HISTORY:  She is married.  Her husband Jillyn Hidden is very supportive.  They  live in [place].  Her son is an ophthalmologist in [place].  She has  another son living in [place].  There is no alcohol, tobacco or drug use.   HISTORY:  Father died at age 62 of lung disorder, atrial fibrillation and  congestive heart failure.  Mother is alive at age 13.  She has a sister who  died at age 69 of breast cancer, and a living brother with bipolar disorder.   REVIEW OF SYSTEMS:  No recent fevers or chills.  Stable weight.  She does  report some dizziness, headache and some chronic nausea.  She has had no  history of melena or bright red bleeding previously.  She denies any chest  pains or shortness of breath, or any GU symptomatology.   PHYSICAL EXAMINATION:  VITAL SIGNS:  Temperature 98.1, blood pressure  139/83, pulse 93, respiratory 22, 99% saturation on room air.  GENERAL:  A well-developed, well-nourished white female, age appropriate,  lying supine with a towel over her face.  She is extremely anxious  and  hysterical at times.  This is actually her baseline and not that different  today as her normal state, as we have seen her in the office.  HEENT:  Pupils equal, round and reactive to light.  Bilateral extraocular  movements are intact.  Oropharynx is dry with moist mucosa.  Oropharynx  reveals pink, moist mucosa.  NECK:  No JVD, no lymphadenopathy.  CARDIOVASCULAR:  Regular rate and rhythm, no murmur, rub or gallop.  LUNGS:  Clear to auscultation bilaterally, no wheezes, rales or rhonchi.  She does have some CVA tenderness bilaterally.  ABDOMEN:  Does have some hyperactive bowel sounds.  It is soft.  There is  some tenderness noted in the epigastric and left upper quadrant area.  There  is no rebound or guarding.  There is no distention.  No masses or  hepatosplenomegaly are appreciated.  EXTREMITIES:  No rashes.  There is no edema.  There is no active synovitis  noted on exam.   LABORATORY DATA:  White count 17,000 with 87% segs, 8% lymphocytes.  Hemoglobin 15.  Platelet count 327,000, MCV 88.   CT scan of the chest shows a 5 mm left lower-lobe nodule which will require  followup.  She does have a 1.6 cm liver cyst.  She has descending colitis  noted from the splenic flexure down.   Urinalysis is yellow clear with specific gravity of 1.021, pH of 6, negative  glucose, trace hemoglobin, negative bilirubin, negative ketones, 30 protein,  negative nitrite, negative leukocyte, rare bacteria, 0-2 red cells.   Sodium 132, potassium 3.2, chloride 105, CO2 25, BUN 11, creatinine 1.0,  glucose 150, calcium 8.8.  Protein 6.5.  Albumin 3.6.  AST 24.  ALT 20.  Alkaline phosphatase 37.  Total bilirubin 0.5.   ASSESSMENT/PLAN:  The patient is a 77 year old white female with a history  of depression, anxiety, rheumatoid arthritis, on chronic steroid therapy,  just started on Remicade recently, who presents with diarrhea, abdominal pain, with CT evidence of descending colitis.  Diarrhea.   The patient does  have leukocytosis without bandemia.  This does suggest a possible infectious  etiology.  She is on chronic prednisone therapy,  and this could account for  some of her leukocytosis.  We will check fecal cultures including Yersinia,  Campylobacter, E. coli (0157), ova & parasites, fecal leukocytes, blood  cultures x2 and Clostridium-difficile toxin.  We will place empirically on  Flagyl and Cipro.  The patient appears to be somewhat dehydrated.  We will  hydrate with normal saline.  We will give her p.r.n. nausea medication with  Phenergan.  We will heme-check stools.   Differential includes gastroenteritis, inflammatory bowel disease or  possible ischemic etiology.  It is reassuring that she does not appear  toxic, and her bicarbonate is normal.  She may need further evaluation, and  we will consult gastroenterology.  We will continue her prednisone.  We will  place her Plaquenil on hold temporarily.  We will place her on IV Protonix  as well.   Anxiety and depression.  Continue Prozac and clonazepam.  There is a risk  that she will worsen during her inpatient stay.   Rheumatoid arthritis.  Continue prednisone.  Plaquenil on hold.  We will  notify Dr. Phylliss Bob of the patient's presence in the hospital.   Prophylaxis.  The patient is ambulating right now.  With her bleeding, we  will not place on subcutaneous heparin or Lovenox.  We will place on PPI for  ulcer prophylaxis.                                                Mark A. Waynard Edwards, M.D.    MAP/MEDQ  D:  08/02/2003  T:  08/02/2003  Job:  [id number]   cc:   Areatha Keas, M.D.  [address]  [address]  [place]  [place] [address]  Fax: [phone]   E. Graceann Congress, M.D.   [facility]

## 2011-02-14 NOTE — Discharge Summary (Signed)
Patricia Mosley, Patricia Mosley                          ACCOUNT NO.:  [id number]   MEDICAL RECORD NO.:  [id number]                   PATIENT TYPE:  INP   LOCATION:  5729                                 FACILITY:  [facility]   PHYSICIAN:  Gaspar Garbe, M.D.            DATE OF BIRTH:  1934/05/25   DATE OF ADMISSION:  08/02/2003  DATE OF DISCHARGE:  08/06/2003                                 DISCHARGE SUMMARY   DISCHARGE DIAGNOSES:  1. Descending colitis.  2. Rheumatoid arthritis.  3. Anxiety.   STUDIES:  The patient had a CAT scan of her abdomen and pelvis on 08/02/03  which showed a descending colitis.  White count 17.0.  The patient had blood  cultures with no growth to date x2.  The patient had stool studies with a C.  diff toxin being negative, O&P negative, white blood cells negative, and  __________ tox still currently pending.  Stool culture has been reincubated;  no growth at this point.  As of day of discharge, she had a white count of  8.9, hemoglobin 13.5, hematocrit 39.9, platelets 324,000.  The BUN and  creatinine are 8 and 1, respectively.  Electrolytes show a mildly reduced  calcium of 8.2 and slightly elevated glucose of 1.2.   PHYSICAL EXAMINATION ON DATE OF DISCHARGE:  GENERAL:  In no acute distress.  HEENT:  Normocephalic, atraumatic, PERRLA. EOMI, __________ .  LUNGS:  Clear to auscultation bilaterally.  HEART:  Regular rate and rhythm without murmur.  ABDOMEN:  Tenderness in the left lower quadrant persists without any  rebound.  Certainly improved from yesterday.   HOSPITAL COURSE:  The patient was admitted on 08/02/03 after a five-day  history of malaise and a day and a half of loose bowel movements.  She was  having 10 bowel movements a day.  A CAT scan of her abdomen showed  diverticulitis.  The patient was treated as per normal with prednisone, and  GI was consulted.  She was placed on IV Protonix and had stool studies as  above drawn and was placed empirically  on Flagyl and Cipro IV.  The patient  tolerated this reasonably well, and as of the morning of 08/05/03 had  decreased her bowel movements considerably, having six bowel movements on  08/04/03, but not having a bowel movement from that point on to the date of  her discharge.  Her diet was gradually increased from clear liquid to full  liquid to a low residue diet which she tolerated well.  Before discharge,  the patient was in reasonably good spirits and indicated that her husband  could help provide some of her meals and her basic needs at home while she  was taking it easy for the next couple of days.  The patient was discharged  home on the morning of 08/06/03.   MEDICATIONS AT DISCHARGE:  1. Cipro 500 mg 1 tablet twice daily,  #  12, completing a 10-day course.  2. Flagyl 500 mg 1 tablet three times daily,  #18, completing a 10-day     course.  3. Phenergan 25 mg 1 tablet every four hours as needed for nausea, #30     given.  4. Prozac 20 mg 1 daily.  5. Prednisone 12.5 mg once daily.  6. Vitamin D and calcium.  7. Plaquenil 200 mg twice daily.  8. Klonopin as needed to anxiety.  9. Zantac 75 twice daily as needed.  10.      Kaopectate as needed if diarrhea returns.   FOLLOW UP:  The patient is to call Dr. Waynard Edwards on Monday to update him with  her current condition and arrange for a follow-up visit.  Noted on her CAT  scan was a 5-mm left lower lobe nodule which requires further followup.  This will be arranged as an outpatient and discussed with Dr. Waynard Edwards at her  next visit.                                                Gaspar Garbe, M.D.    RWT/MEDQ  D:  08/06/2003  T:  08/06/2003  Job:  [id number]   cc:   Areatha Keas, M.D.  [address]  [address]  [place]  [place] [address]  Fax: [phone]   Redge Gainer. Waynard Edwards, M.D.  [address]  [place]  [place] [address]  Fax: [phone]

## 2011-02-14 NOTE — H&P (Signed)
Patricia Mosley, Patricia Mosley                ACCOUNT NO.:  [id number]   MEDICAL RECORD NO.:  [id number]          PATIENT TYPE:  INP   LOCATION:  6703                         FACILITY:  [facility]   PHYSICIAN:  Barry Dienes. Eloise Harman, M.D.DATE OF BIRTH:  09/09/1934   DATE OF ADMISSION:  07/04/2006  DATE OF DISCHARGE:                                HISTORY & PHYSICAL   CHIEF COMPLAINT:  Rectal bleeding.   HISTORY OF PRESENT ILLNESS:  The patient is a 77 year old white female who  recently had a dental procedure approximately 10 days ago.  Cultures from  the procedure grew out actinomyces, so doxycycline was started on October  3rd.  Over the past few days she has developed gradually worsening cramping  abdominal pain with a small amount of rectal bleeding yesterday and today.  She has not had fever or chills and denies shortness of breath or chest  pain.  She does complain of mild lightheadedness when sitting upright.   PAST MEDICAL HISTORY:  Is most significant for:  1. A November, 2004 episode of rectal bleeding with a workup including      colonoscopy that showed descending colitis.  2. She also has a history of rheumatoid arthritis and is on immune      suppression drugs for that.  3. Anxiety.  4. Panic disorder.  5. Cryoglobulinemia.  6. Fibromyalgia.  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   MEDICATIONS PRIOR TO ADMISSION:  1. Citracal D 600 mg twice daily.  2. Doxycycline 100 mg twice daily.  3. Humira 40 mg subcutaneous every other Friday, with the last dose being      8 days prior to admission.  4. Folate 1 mg twice daily.  5. Zantac 150 mg twice daily.  6. Hydroxychloroquine 200 mg twice daily.  7. Forteo 20 mcg subcutaneous once daily.  8. Fluoxetine 20 mg daily.  9. Lorazepam 0.5 mg q.h.s.  10.Prednisone 10 mg daily.   ALLERGIES AND DRUG INTOLERANCES:  1. CODEINE.  2. EPINEPHRINE.  3. SULFA DRUGS.  4. PENICILLIN.  5. DEMEROL.  6. TALWIN.  7. ROBAXIN.  8.  NEURONTIN.  9. QUININE.  10.ESTROGEN.  11.DEXTROMETHORPHAN.  12.ALLEGRA.  13.FLORES EYE DROPS.   PAST SURGICAL HISTORY:  1. Tonsillectomy.  2. Bladder suspension with appendectomy.  3. Dilatation and curettage.  4. Cystoscopy.  5. Dental procedure 10 days ago.   FAMILY HISTORY:  1. Father died at age 32 of COPD.  2. Sister died at age 93 of breast cancer.  3. Brother has bipolar disease.   SOCIAL HISTORY:  She is married and has two sons.  One son is an  ophthalmologist in [place].  She has a no history of alcohol abuse or  tobacco abuse.   REVIEW OF SYSTEMS:  She has chronic mild  bilateral wrist and shoulder pain  and chronic mild to moderate anxiety.  She denies change in her vision,  shortness of breath, cough, chest pain, nausea or vomiting, or depression.  She has been eating well lately.   INITIAL EVALUATION AND MANAGEMENT:  VITAL SIGNS:  Blood pressure  134/61,  pulse 88, respirations 24, temperature 98.2.  GENERAL:  She is an elderly white female who is in no apparent distress  while sitting partially upright in bed.  HEENT EXAM:  Within normal limits.  NECK:  Supple without jugular venous distention or carotid bruit.  CHEST:  Clear to auscultation.  HEART:  Regular rate and rhythm without significant murmur or gallop.  ABDOMEN:  Normal bowel sounds and no hepatosplenomegaly or tenderness.  RECTAL EXAM:  Had normal sphincter tone with no gross blood on the examining  fingertip and no rectal mass detectible.  NEUROLOGICAL EXAM:  She was alert and well oriented.  There were no focal  neurologic deficits.  EXTREMITIES:  Had mild changes of rheumatoid arthritis bilaterally in the  wrists and there were normal pedal pulses with no peripheral edema.   LABORATORY STUDIES:  White blood cell count 16.5, hemoglobin 14, hematocrit  41, platelets 350, PT 14.1, serum sodium 134, potassium 3.3, chloride 101,  BUN 15, creatinine 1.1, glucose 134.   EKG:  Showed the  following:  1. Normal sinus rhythm.  2. Left axis deviation (-53 degrees axis).  3. Nonspecific ST segment abnormality.   IMPRESSION PLAN:  1. Rectal bleeding:  This appears to be due to a low volume rectal bleed      possibly due to local trauma or internal hemorrhoids.  Recurrent      colitis is possible.  Although, a new diagnosis of colonic neoplasm is      somewhat unlikely given her 2004 colonoscopy, she does remain on immune      suppression which predisposes her to this.  I plan to check a stool      specimen for Clostridium difficile, as this is possible given her      antibiotic treatment.  We will obtain a Gastrointestinal consultation      and follow her hematocrit closely.  We will change her from Zantac to      proton pump inhibitor treatment for the unlikely possibility of an      upper gastrointestinal bleed.  2. Rheumatoid arthritis:  Stable on her current medications which will be      continued.  3. Dental actinomyces:  Given that she is somewhat immune compromised from      her Humira treatment and prednisone, she needs continued treatment.      Accordingly, she will be changed to Flagyl at 500 mg every 8 hours.           ______________________________  Barry Dienes Eloise Harman, M.D.     DGP/MEDQ  D:  07/04/2006  T:  07/05/2006  Job:  [id number]   cc:   Loraine Leriche A. Waynard Edwards, M.D.  Areatha Keas, M.D.  Barbette Hair. Arlyce Dice, MD,FACG

## 2011-02-14 NOTE — Assessment & Plan Note (Signed)
[facility]                         GASTROENTEROLOGY OFFICE NOTE   Patricia Mosley, Patricia Mosley                       MRN:          [id number]  DATE:09/07/2006                            DOB:          20-May-1934    PROBLEM:  History of ischemic colitis.  Patricia Mosley has returned for  scheduled GI followup.  She was hospitalized last month with what turned  out to be ischemic colitis.  Since that time, she has done extremely  well.  She currently has no GI complaints.  Specifically, she is without  abdominal pain, diarrhea, rectal bleeding, or melena.   EXAM:  Pulse 96, blood pressure 110/82, weight 154.   IMPRESSION:  Ischemic colitis - resolved.   RECOMMENDATIONS:  No further GI workup.  Patricia Mosley last underwent  colonoscopy in 2004.  This need not be repeated before 2014 unless she  has recurrent problems.     Barbette Hair. Arlyce Dice, MD,FACG  Electronically Signed    RDK/MedQ  DD: 09/07/2006  DT: 09/07/2006  Job #: [id number]   cc:   Loraine Leriche A. Perini, M.D.

## 2011-03-12 ENCOUNTER — Ambulatory Visit (HOSPITAL_COMMUNITY): Payer: Medicare Other

## 2011-03-21 ENCOUNTER — Encounter (HOSPITAL_COMMUNITY): Payer: Medicare Other | Attending: Internal Medicine

## 2011-03-21 DIAGNOSIS — M069 Rheumatoid arthritis, unspecified: Secondary | ICD-10-CM | POA: Insufficient documentation

## 2011-03-21 DIAGNOSIS — M810 Age-related osteoporosis without current pathological fracture: Secondary | ICD-10-CM | POA: Insufficient documentation

## 2011-06-25 ENCOUNTER — Encounter (HOSPITAL_COMMUNITY): Payer: Medicare Other | Attending: Internal Medicine

## 2011-06-25 DIAGNOSIS — M810 Age-related osteoporosis without current pathological fracture: Secondary | ICD-10-CM | POA: Insufficient documentation

## 2011-07-02 ENCOUNTER — Inpatient Hospital Stay (HOSPITAL_COMMUNITY): Payer: Medicare Other

## 2011-07-02 ENCOUNTER — Inpatient Hospital Stay (HOSPITAL_COMMUNITY)
Admission: EM | Admit: 2011-07-02 | Discharge: 2011-07-04 | DRG: 313 | Disposition: A | Discharge status: Home / Self Care | Payer: Medicare Other | Attending: Cardiology | Admitting: Cardiology

## 2011-07-02 ENCOUNTER — Emergency Department (HOSPITAL_COMMUNITY): Payer: Medicare Other

## 2011-07-02 DIAGNOSIS — Z888 Allergy status to other drugs, medicaments and biological substances status: Secondary | ICD-10-CM

## 2011-07-02 DIAGNOSIS — F411 Generalized anxiety disorder: Secondary | ICD-10-CM | POA: Diagnosis present

## 2011-07-02 DIAGNOSIS — R079 Chest pain, unspecified: Secondary | ICD-10-CM

## 2011-07-02 DIAGNOSIS — M069 Rheumatoid arthritis, unspecified: Secondary | ICD-10-CM | POA: Diagnosis present

## 2011-07-02 DIAGNOSIS — D892 Hypergammaglobulinemia, unspecified: Secondary | ICD-10-CM | POA: Diagnosis present

## 2011-07-02 DIAGNOSIS — T380X5A Adverse effect of glucocorticoids and synthetic analogues, initial encounter: Secondary | ICD-10-CM | POA: Diagnosis present

## 2011-07-02 DIAGNOSIS — F329 Major depressive disorder, single episode, unspecified: Secondary | ICD-10-CM | POA: Diagnosis present

## 2011-07-02 DIAGNOSIS — K5730 Diverticulosis of large intestine without perforation or abscess without bleeding: Secondary | ICD-10-CM | POA: Diagnosis present

## 2011-07-02 DIAGNOSIS — F3289 Other specified depressive episodes: Secondary | ICD-10-CM | POA: Diagnosis present

## 2011-07-02 DIAGNOSIS — E139 Other specified diabetes mellitus without complications: Secondary | ICD-10-CM | POA: Diagnosis present

## 2011-07-02 DIAGNOSIS — K449 Diaphragmatic hernia without obstruction or gangrene: Secondary | ICD-10-CM | POA: Diagnosis present

## 2011-07-02 DIAGNOSIS — Z880 Allergy status to penicillin: Secondary | ICD-10-CM

## 2011-07-02 DIAGNOSIS — R1013 Epigastric pain: Secondary | ICD-10-CM

## 2011-07-02 DIAGNOSIS — IMO0002 Reserved for concepts with insufficient information to code with codable children: Secondary | ICD-10-CM

## 2011-07-02 DIAGNOSIS — Z882 Allergy status to sulfonamides status: Secondary | ICD-10-CM

## 2011-07-02 DIAGNOSIS — K219 Gastro-esophageal reflux disease without esophagitis: Secondary | ICD-10-CM | POA: Diagnosis present

## 2011-07-02 DIAGNOSIS — I2510 Atherosclerotic heart disease of native coronary artery without angina pectoris: Secondary | ICD-10-CM | POA: Diagnosis present

## 2011-07-02 DIAGNOSIS — Z7982 Long term (current) use of aspirin: Secondary | ICD-10-CM

## 2011-07-02 DIAGNOSIS — Z91013 Allergy to seafood: Secondary | ICD-10-CM

## 2011-07-02 DIAGNOSIS — IMO0001 Reserved for inherently not codable concepts without codable children: Secondary | ICD-10-CM | POA: Diagnosis present

## 2011-07-02 DIAGNOSIS — R072 Precordial pain: Secondary | ICD-10-CM

## 2011-07-02 DIAGNOSIS — M810 Age-related osteoporosis without current pathological fracture: Secondary | ICD-10-CM | POA: Diagnosis present

## 2011-07-02 DIAGNOSIS — N183 Chronic kidney disease, stage 3 unspecified: Secondary | ICD-10-CM | POA: Diagnosis present

## 2011-07-02 DIAGNOSIS — G43909 Migraine, unspecified, not intractable, without status migrainosus: Secondary | ICD-10-CM | POA: Diagnosis present

## 2011-07-02 DIAGNOSIS — E559 Vitamin D deficiency, unspecified: Secondary | ICD-10-CM | POA: Diagnosis present

## 2011-07-02 DIAGNOSIS — Z79899 Other long term (current) drug therapy: Secondary | ICD-10-CM

## 2011-07-02 DIAGNOSIS — R0789 Other chest pain: Principal | ICD-10-CM | POA: Diagnosis present

## 2011-07-02 LAB — CARDIAC PANEL(CRET KIN+CKTOT+MB+TROPI)
Relative Index: 2.7 — ABNORMAL HIGH (ref 0.0–2.5)
Total CK: 102 U/L (ref 7–177)

## 2011-07-02 LAB — COMPREHENSIVE METABOLIC PANEL
Albumin: 3.5 g/dL (ref 3.5–5.2)
Alkaline Phosphatase: 34 U/L — ABNORMAL LOW (ref 39–117)
BUN: 13 mg/dL (ref 6–23)
Calcium: 8.8 mg/dL (ref 8.4–10.5)
Creatinine, Ser: 0.85 mg/dL (ref 0.50–1.10)
GFR calc Af Amer: 76 mL/min — ABNORMAL LOW (ref >90)
Glucose, Bld: 111 mg/dL — ABNORMAL HIGH (ref 70–99)
Potassium: 3.9 mEq/L (ref 3.5–5.1)
Total Protein: 6.1 g/dL (ref 6.0–8.3)

## 2011-07-02 LAB — POCT I-STAT TROPONIN I: Troponin i, poc: 0 ng/mL (ref 0.00–0.08)

## 2011-07-02 LAB — TROPONIN I: Troponin I: <0.3 ng/mL (ref <0.30)

## 2011-07-02 LAB — GLUCOSE, CAPILLARY
Glucose-Capillary: 117 mg/dL — ABNORMAL HIGH (ref 70–99)
Glucose-Capillary: 240 mg/dL — ABNORMAL HIGH (ref 70–99)

## 2011-07-02 LAB — POCT I-STAT, CHEM 8
Calcium, Ion: 1.13 mmol/L (ref 1.12–1.32)
Glucose, Bld: 117 mg/dL — ABNORMAL HIGH (ref 70–99)
HCT: 41 % (ref 36.0–46.0)
Hemoglobin: 13.9 g/dL (ref 12.0–15.0)
Potassium: 3.8 mEq/L (ref 3.5–5.1)

## 2011-07-02 LAB — CBC
MCHC: 33 g/dL (ref 30.0–36.0)
Platelets: 360 10*3/uL (ref 150–400)
RDW: 14.2 % (ref 11.5–15.5)

## 2011-07-02 LAB — D-DIMER, QUANTITATIVE: D-Dimer, Quant: 2.34 ug/mL-FEU — ABNORMAL HIGH (ref 0.00–0.48)

## 2011-07-03 LAB — CARDIAC PANEL(CRET KIN+CKTOT+MB+TROPI)
Relative Index: 2.1 (ref 0.0–2.5)
Troponin I: <0.3 ng/mL (ref <0.30)

## 2011-07-03 LAB — GLUCOSE, CAPILLARY
Glucose-Capillary: 133 mg/dL — ABNORMAL HIGH (ref 70–99)
Glucose-Capillary: 137 mg/dL — ABNORMAL HIGH (ref 70–99)
Glucose-Capillary: 222 mg/dL — ABNORMAL HIGH (ref 70–99)

## 2011-07-04 LAB — BASIC METABOLIC PANEL
BUN: 15 mg/dL (ref 6–23)
BUN: 16 mg/dL (ref 6–23)
BUN: 19 mg/dL (ref 6–23)
CO2: 26 mEq/L (ref 19–32)
CO2: 26 mEq/L (ref 19–32)
CO2: 26 mEq/L (ref 19–32)
Calcium: 8.4 mg/dL (ref 8.4–10.5)
Calcium: 9.5 mg/dL (ref 8.4–10.5)
Chloride: 108 mEq/L (ref 96–112)
Chloride: 109 mEq/L (ref 96–112)
Creatinine, Ser: 1.01 mg/dL (ref 0.4–1.2)
Creatinine, Ser: 1.06 mg/dL (ref 0.4–1.2)
GFR calc Af Amer: >60 mL/min (ref >60)
GFR calc non Af Amer: 47 mL/min — ABNORMAL LOW (ref >60)
GFR calc non Af Amer: 51 mL/min — ABNORMAL LOW (ref >60)
Glucose, Bld: 107 mg/dL — ABNORMAL HIGH (ref 70–99)
Glucose, Bld: 138 mg/dL — ABNORMAL HIGH (ref 70–99)
Glucose, Bld: 84 mg/dL (ref 70–99)
Potassium: 4.2 mEq/L (ref 3.5–5.1)
Sodium: 139 mEq/L (ref 135–145)

## 2011-07-04 LAB — CBC
HCT: 39.8 % (ref 36.0–46.0)
HCT: 41 % (ref 36.0–46.0)
Hemoglobin: 13.5 g/dL (ref 12.0–15.0)
MCHC: 32.7 g/dL (ref 30.0–36.0)
MCHC: 32.8 g/dL (ref 30.0–36.0)
MCV: 90.5 fL (ref 78.0–100.0)
MCV: 90.8 fL (ref 78.0–100.0)
MCV: 91.1 fL (ref 78.0–100.0)
Platelets: 312 10*3/uL (ref 150–400)
Platelets: 320 10*3/uL (ref 150–400)
Platelets: 328 10*3/uL (ref 150–400)
Platelets: 357 10*3/uL (ref 150–400)
RBC: 4.53 MIL/uL (ref 3.87–5.11)
RDW: 13.5 % (ref 11.5–15.5)
RDW: 13.5 % (ref 11.5–15.5)
WBC: 10.6 10*3/uL — ABNORMAL HIGH (ref 4.0–10.5)
WBC: 9.9 10*3/uL (ref 4.0–10.5)

## 2011-07-04 LAB — APTT: aPTT: 27 seconds (ref 24–37)

## 2011-07-04 LAB — URINALYSIS, ROUTINE W REFLEX MICROSCOPIC
Glucose, UA: NEGATIVE mg/dL
Hgb urine dipstick: NEGATIVE
Ketones, ur: NEGATIVE mg/dL
Protein, ur: NEGATIVE mg/dL
pH: 7.5 (ref 5.0–8.0)

## 2011-07-04 LAB — TROPONIN I: Troponin I: 0.01 ng/mL (ref 0.00–0.06)

## 2011-07-04 LAB — COMPREHENSIVE METABOLIC PANEL
BUN: 15 mg/dL (ref 6–23)
CO2: 24 mEq/L (ref 19–32)
Calcium: 8.9 mg/dL (ref 8.4–10.5)
Chloride: 103 mEq/L (ref 96–112)
Creatinine, Ser: 1.39 mg/dL — ABNORMAL HIGH (ref 0.4–1.2)
GFR calc Af Amer: 45 mL/min — ABNORMAL LOW (ref >60)
GFR calc non Af Amer: 37 mL/min — ABNORMAL LOW (ref >60)
Total Bilirubin: 0.8 mg/dL (ref 0.3–1.2)

## 2011-07-04 LAB — GLUCOSE, CAPILLARY
Glucose-Capillary: 100 mg/dL — ABNORMAL HIGH (ref 70–99)
Glucose-Capillary: 107 mg/dL — ABNORMAL HIGH (ref 70–99)
Glucose-Capillary: 115 mg/dL — ABNORMAL HIGH (ref 70–99)
Glucose-Capillary: 116 mg/dL — ABNORMAL HIGH (ref 70–99)
Glucose-Capillary: 90 mg/dL (ref 70–99)
Glucose-Capillary: 117 mg/dL — ABNORMAL HIGH (ref 70–99)

## 2011-07-04 LAB — CARDIAC PANEL(CRET KIN+CKTOT+MB+TROPI)
CK, MB: 1.5 ng/mL (ref 0.3–4.0)
Relative Index: 2 (ref 0.0–2.5)
Total CK: 85 U/L (ref 7–177)
Troponin I: 0.01 ng/mL (ref 0.00–0.06)
Troponin I: 0.02 ng/mL (ref 0.00–0.06)
Troponin I: <0.01 ng/mL (ref 0.00–0.06)

## 2011-07-04 LAB — HEMOGLOBIN A1C
Hgb A1c MFr Bld: 6.5 % — ABNORMAL HIGH (ref 4.6–6.1)
Mean Plasma Glucose: 140 mg/dL

## 2011-07-04 LAB — MAGNESIUM: Magnesium: 2.4 mg/dL (ref 1.5–2.5)

## 2011-07-04 LAB — PROTIME-INR
INR: 1 (ref 0.00–1.49)
Prothrombin Time: 12.8 seconds (ref 11.6–15.2)
Prothrombin Time: 14.3 seconds (ref 11.6–15.2)

## 2011-07-04 LAB — URINE CULTURE: Colony Count: 10000

## 2011-07-04 LAB — DIFFERENTIAL
Basophils Absolute: 0 10*3/uL (ref 0.0–0.1)
Basophils Relative: 0 % (ref 0–1)
Lymphocytes Relative: 9 % — ABNORMAL LOW (ref 12–46)
Neutro Abs: 9 10*3/uL — ABNORMAL HIGH (ref 1.7–7.7)
Neutrophils Relative %: 87 % — ABNORMAL HIGH (ref 43–77)

## 2011-07-04 LAB — LIPID PANEL: VLDL: 12 mg/dL (ref 0–40)

## 2011-07-04 LAB — CK TOTAL AND CKMB (NOT AT ARMC)
CK, MB: 3.1 ng/mL (ref 0.3–4.0)
Relative Index: 2.1 (ref 0.0–2.5)

## 2011-07-04 LAB — TSH: TSH: 1.688 u[IU]/mL (ref 0.350–4.500)

## 2011-07-09 NOTE — H&P (Signed)
Patricia Mosley, Patricia Mosley NO.:  [id number]  MEDICAL RECORD NO.:  [id number]  LOCATION:  3742                         FACILITY:  [facility]  PHYSICIAN:  Cassell Clement, M.D. DATE OF BIRTH:  October 08, 1933  DATE OF ADMISSION:  07/02/2011 DATE OF DISCHARGE:                             HISTORY & PHYSICAL   CARDIOLOGIST:  Peter M. Swaziland, MD  PRIMARY CARE PHYSICIAN:  Redge Gainer. Waynard Edwards, MD  GASTROENTEROLOGIST:  Barbette Hair. Arlyce Dice, MD,FACG  CHIEF COMPLAINT:  Chest pain.  HISTORY:  This is a 77 year old married woman who arrives in the emergency room complaining of intermittent sharp chest pain.  She has been experiencing these episodes for the past several weeks.  Two weeks ago after eating steak at [facility] Tuesday, she had an episode of severe lower chest and upper epigastric discomfort, which occurred while she was still eating.  Several days later while eating in a restaurant, she had another episode while eating a steak.  Saturday night while eating barbecue, she had another episode and had to take 4 Tums with improvement.  Last night it became W.  She again developed chest pain while eating.  The pain has persisted off and on all night and she came in this morning.  She has not eaten anything today.  The patient does not have any history of known ischemic heart disease.  She was admitted in December 2009 and underwent cardiac catheterization at that time by Dr. Julien Nordmann and was found to have only very mild disease in the proximal right coronary artery, otherwise negative.  Her husband states that he was told that it was about 20%.  On that admission, it was felt that her chest pain was noncardiac.  Of note is the fact that she has had a lot of medical issues.  She was admitted in 2011 with a diverticular abscess, which was treated with intravenous antibiotics and did not require surgical drainage.  She was readmitted by Dr. Timothy Lasso in February 2012 with recurrent  diverticulitis, which responded to medical therapy.  She had a colonoscopy she thinks about a year ago.  She has not had a recent endoscopy.  The patient does not give any history of exertional chest pain, although she is relatively sedentary because of her other medical problems.  She does not give any history to suggest congestive heart failure.  Last year Dr. Swaziland, her cardiologist, had her wear an event monitor for 4 weeks, but she states that nothing was found abnormal.  PAST MEDICAL HISTORY:  History of rheumatoid arthritis for the past 10 years.  Dr. Dareen Piano is her rheumatologist.  The patient also has a history of fibromyalgia and a history of prednisone-induced diabetes mellitus.  SOCIAL HISTORY:  The patient lives in [place].  She is married and has two sons.  One of her sons is a Development worker, community in [place].  The patient is a housewife.  She does not use alcohol or tobacco.  FAMILY HISTORY:  Father died at 89 of explosive exposure.  Mother died at 15 of heart trouble.  ALLERGIES:  She has history of multiple allergies including ORAL BONIVA, although she can take it by infusion every 3  months, also allergic to CODEINE, DEMEROL, DOXYCYCLINE, EPINEPHRINE, METHOTREXATE, NEURONTIN, PENICILLINS, ROBAXIN, SULFONAMIDES, TALWIN, TETRACYCLINE, ERYTHROMYCIN BASE, HUMIRA, REMICADE, FLAGYL, ROBAXIN, ESTROGEN, and SHELLFISH.  HOME MEDICATIONS: 1. Oral calcium daily. 2. Prozac 20 mg daily. 3. Folic acid 1 daily. 4. Hydroxychloroquine 200 mg twice a day. 5. Januvia 100 mg taking one-half tablet daily. 6. Lorazepam 0.5 mg nightly p.r.n. and also during the day p.r.n. 7. Prednisone 7.5 mg daily. 8. Omeprazole, uncertain dose once daily. 9. Ranitidine 150 mg twice a day.  REVIEW OF SYSTEMS:  No significant weight loss.  She had her recent annual physical with Dr. Waynard Edwards just several weeks ago and she states everything checked out okay.  She has had no fever, chills, or  night sweats.  She has had some problems with dizziness.  She is not having any genitourinary symptoms.  She is not having any hematochezia or melena.  All other systems reviewed and are negative.  PHYSICAL EXAMINATION:  VITAL SIGNS:  Blood pressure is 104/75, pulse 74 and regular, temperature is 98, oxygen saturation 97. GENERAL APPEARANCE:  Thin white female who is lying flat comfortably. Every so often, she will yell out with sharp, lancinating pain, which is very brief and does not radiate.  The pain appears to be centered in the lower substernal area and in the high epigastrium. HEENT:  Head and neck exam is unremarkable.  Pupils were equal, round, and reactive to light and accommodation.  Extraocular movements were full.  The sclerae are clear.  Mouth and pharynx normal.  Carotid pulses have a normal upstroke.  No bruits.  No jugular venous distention.  No thyroid enlargement.  There is no lymphadenopathy. CHEST:  Clear to percussion and auscultation. HEART:  Quiet precordium without murmur, gallop, rub, or click. ABDOMEN:  Tender in the high epigastrium.  Bowel sounds are present. There is no hepatosplenomegaly. RECTAL:  Deferred. EXTREMITIES:  Good peripheral pulses.  No edema.  No phlebitis.  Chest x-ray shows normal heart size, clear lungs.  She does have a hiatal hernia.  The EKG shows normal sinus rhythm, no acute changes, and is unchanged from the previous EKG of November 01, 2010.  Initial labs include white count 9900, hemoglobin 13.2, hematocrit 40. Sodium 139, potassium 3.8, BUN 16, creatinine 1.10, and her initial troponin-I is 0.00.  IMPRESSION: 1. Atypical chest pain, suspect noncardiac with a history of     essentially normal cardiac catheterization on September 21, 2008,     showing only very mild disease in the proximal right coronary     artery. 2. Suspect possible gastrointestinal origin of her and since it seems     to be closely tied in with eating steaks  in restaurant and comes on     during or immediately after eating.  She does have a hiatal hernia     seen on plain chest x-ray today. 3. Rheumatoid arthritis. 4. Fibromyalgia. 5. History of diverticulitis and diverticulosis with previous     diverticular abscess.  DISPOSITION:  We will admit to telemetry.  We will get serial enzymes. We will get a 2-D echo.  We will continue PPIs and basal steroids.  We will ask Dr. Marzetta Board crew for GI consultation to consider endoscopy or possibly antispasmodic therapy.          ______________________________ Cassell Clement, M.D.     TB/MEDQ  D:  07/02/2011  T:  07/02/2011  Job:  [id number]  cc:   Barbette Hair. Arlyce Dice, MD,FACG Mark A. Perini, M.D. Theron Arista  M. [place], M.D.  Electronically Signed by Cassell Clement M.D. on 07/09/2011 09:00:51 AM

## 2011-07-21 NOTE — Discharge Summary (Signed)
[name], Mosley NO.:  [id number]  MEDICAL RECORD NO.:  [id number]  LOCATION:  3742                         FACILITY:  [facility]  PHYSICIAN:  Pihu Basil M. Sw[place]and, M.D.  DATE OF BIRTH:  Jan 31, 1934  DATE OF ADMISSION:  07/02/2011 DATE OF DISCHARGE:  07/04/2011                              DISCHARGE SUMMARY   PRIMARY CARDIOLOGIST:  Arneda Sappington M. [place]iland, MD  PRIMARY CARE PROVIDER:  Redge Gainer. Waynard Edwards, MD  RHEUMATOLOGIST:  Lurena Nida, MD  GASTROENTEROLOGIST:  Barbette Hair. Arlyce Dice, MD, [facility]  DISCHARGE DIAGNOSIS:  Chest and epigastric pain without objective evidence of ischemia.  SECONDARY DIAGNOSES: 1. Rheumatoid arthritis followed by Dr. Dareen Piano. 2. Fibromyalgia. 3. Prednisone-induced diabetes mellitus. 4. Hiatal hernia, stable imaging this admission. 5. Gastroesophageal reflux disease. 6. Stage III chronic kidney disease. 7. Migraine headaches. 8. Cryoglobulinemia. 9. Osteoporosis. 10.Vitamin D deficiency. 11.Depression. 12.Anxiety. 13.History of diverticulitis in 2004. 14.Status post appendectomy. 15.Status post bladder slinging. 16.Status post dilation and curettage. 17.History of nonobstructive coronary artery disease by     catheterization in 2009.  ALLERGIES:  SULFA, PENICILLIN, CODEINE, EPINEPHRINE, DEMEROL, TETRACYCLINE, SHELLFISH, METHOTREXATE, FLAGYL, ACTONEL.  PROCEDURES: 1. A 2-D echocardiogram on July 02, 2011, showing an EF of 60%-65%     with normal wall motion.  No significant valvular abnormalities. 2. Abdominal ultrasound on July 02, 2011, revealing no gallstones,     pericholecystic fluid, gallbladder wall thickening or tenderness     over the gallbladder during scanning.  Limited evaluation of     pancreas secondary to the patient's habitus and bowel gas. 3. EGD performed on July 03, 2011, revealing a normal exam with the     exception of hiatal hernia.  No GI cause for pain found or     suspected.  HISTORY OF PRESENT ILLNESS:   A 77 year old female with prior history of chest pain status post catheterization in 2009, revealing nonobstructive CAD.  She also has chronic pain related to fibromyalgia and rheumatoid arthritis and is followed closely by Rheumatology.  Over the past several weeks, the patient has been experiencing spells of severe lower chest and epigastric discomfort, mostly related to eating out at restaurants.  Symptoms are sometimes improved with Tums.  She had recurrent symptoms one morning when walking into a restaurant and this prompted her present to the ED on July 02, 2011.  In the ED, cardiac enzymes were negative and ECG was nonacute.  Decision was made to admit for further evaluation.  HOSPITAL COURSE:  The patient ruled out for MI.  She continued to have epigastric and upper abdominal discomfort which was worse with palpation.  She was noted on chest x-ray to have a stable hiatal hernia. Given the association of symptoms with meals, Gastroenterology was consulted and recommended abdominal ultrasound which was performed on July 02, 2011, and showed no acute abnormalities.  Further, EGD was performed on July 03, 2011, and was normal with the exception of hiatal hernia which was not felt to be causing the patient's symptoms. Gastroenterology recommended continuation of PPI therapy in the outpatient setting.  This morning, [name]'s pain is significantly improved, though she remains tender along her left lower sternal border which  is chronic.  She will be discharged home today in good condition.  DISCHARGE LABORATORY DATA:  Hemoglobin is 13.9, hematocrit 41.0, WBC 9.9, platelets 360.  INR 0.98, D-dimer 2.34.  Sodium 139, potassium 3.9, chloride 104, CO2 of 27, BUN 13, creatinine 0.85, glucose 111.  Total bilirubin 0.5, alkaline phosphatase 34, AST 29, ALT 16, total protein 6.1, albumin 3.5, calcium 8.8, lipase 52.  CK 138, MB 2.9, troponin I less than 0.30.  DISPOSITION:  The  patient will be discharged home today in good condition.  FOLLOWUP PLANS AND APPOINTMENTS:  The patient will follow up with Dr. Waynard Edwards and Dr. Dareen Piano as previously scheduled.  She will follow up with Dr. Arlyce Dice and Dr. Swaziland as needed.  DISCHARGE MEDICATIONS: 1. Aspirin 81 mg daily. 2. Omeprazole 20 mg b.i.d. 3. Acetaminophen 500 mg 1-2 tablets q.6 h. p.r.n. 4. Boniva 3 mg injected quarterly. 5. Calcium carbonate 600 mg plus D 1 tablet daily. 6. Fluoxetine 20 mg daily. 7. Folic acid 1 mg b.i.d. 8. Hydroxychloroquine 200 mg b.i.d. 9. Januvia 100 mg daily. 10.Lorazepam 0.5 mg b.i.d. p.r.n. 11.Prednisone 5 mg one and a half tablets daily. 12.Ranitidine 150 mg b.i.d.  OUTSTANDING LABS AND STUDIES:  None.  DURATION OF DISCHARGE ENCOUNTER:  Sixty minutes including physician time.     Nicolasa Ducking, ANP   ______________________________ Jefry Lesinski M. [place], M.D.    CB/MEDQ  D:  07/04/2011  T:  07/04/2011  Job:  [id number]  cc:   Loraine Leriche A. Perini, M.D. Lurena Nida, MD Barbette Hair. Arlyce Dice, MD,FACG  Electronically Signed by Nicolasa Ducking ANP on 07/09/2011 07:14:12 PM Electronically Signed by Raeann Offner Sw[place]and M.D. on 07/21/2011 02:55:56 PM

## 2011-09-24 ENCOUNTER — Encounter (HOSPITAL_COMMUNITY)
Admission: RE | Admit: 2011-09-24 | Discharge: 2011-09-24 | Disposition: A | Discharge status: Home / Self Care | Payer: Medicare Other | Source: Ambulatory Visit | Attending: Internal Medicine | Admitting: Internal Medicine

## 2011-09-24 ENCOUNTER — Encounter (HOSPITAL_COMMUNITY): Payer: Self-pay

## 2011-09-24 DIAGNOSIS — M810 Age-related osteoporosis without current pathological fracture: Secondary | ICD-10-CM | POA: Insufficient documentation

## 2011-09-24 HISTORY — DX: Fibromyalgia: M79.7

## 2011-09-24 HISTORY — DX: Rheumatoid arthritis, unspecified: M06.9

## 2011-09-24 MED ORDER — SODIUM CHLORIDE 0.9 % IV SOLN
INTRAVENOUS | Status: AC
  Administered 2011-09-24: 11:00:00 via INTRAVENOUS

## 2011-09-24 MED ORDER — IBANDRONATE SODIUM 3 MG/3ML IV SOLN
3.0000 mg | Freq: Once | INTRAVENOUS | Status: AC
  Administered 2011-09-24: 3 mg via INTRAVENOUS
  Filled 2011-09-24: qty 3

## 2011-12-09 ENCOUNTER — Other Ambulatory Visit (HOSPITAL_COMMUNITY): Payer: Self-pay | Admitting: *Deleted

## 2011-12-10 ENCOUNTER — Encounter (HOSPITAL_COMMUNITY): Payer: Self-pay

## 2011-12-10 ENCOUNTER — Ambulatory Visit (HOSPITAL_COMMUNITY)
Admission: RE | Admit: 2011-12-10 | Discharge: 2011-12-10 | Disposition: A | Discharge status: Home / Self Care | Payer: Medicare Other | Source: Ambulatory Visit | Attending: Internal Medicine | Admitting: Internal Medicine

## 2011-12-10 DIAGNOSIS — M810 Age-related osteoporosis without current pathological fracture: Secondary | ICD-10-CM | POA: Insufficient documentation

## 2011-12-10 MED ORDER — IBANDRONATE SODIUM 3 MG/3ML IV SOLN
3.0000 mg | INTRAVENOUS | Status: AC
  Administered 2011-12-10: 3 mg via INTRAVENOUS

## 2011-12-10 MED ORDER — SODIUM CHLORIDE 0.9 % IV SOLN
INTRAVENOUS | Status: AC
  Administered 2011-12-10: 11:00:00 via INTRAVENOUS

## 2011-12-10 MED ORDER — IBANDRONATE SODIUM 3 MG/3ML IV SOLN
INTRAVENOUS | Status: AC
  Administered 2011-12-10: 3 mg via INTRAVENOUS
  Filled 2011-12-10: qty 3

## 2011-12-10 NOTE — Discharge Instructions (Signed)
Ibandronate injection What is this medicine? IBANDRONATE (i BAN droh nate) slows calcium loss from bones. It is used to treat osteoporosis in women past the age of menopause. This medicine may be used for other purposes; ask your health care provider or pharmacist if you have questions. What should I tell my health care provider before I take this medicine? They need to know if you have any of these conditions: -dental disease -kidney disease -low levels of calcium in the blood -low levels of vitamin D in the blood -an unusual or allergic reaction to ibandronate, other medicines, foods, dyes, or preservatives -pregnant or trying to get pregnant -breast-feeding How should I use this medicine? This medicine is for injection into a vein. It is given by a health care professional in a hospital or clinic setting. Talk to your pediatrician regarding the use of this medicine in children. Special care may be needed. Overdosage: If you think you have taken too much of this medicine contact a poison control center or emergency room at once. NOTE: This medicine is only for you. Do not share this medicine with others. What if I miss a dose? It is important not to miss your dose. Call your doctor or health care professional if you are unable to keep an appointment. What may interact with this medicine? -teriparatide This list may not describe all possible interactions. Give your health care provider a list of all the medicines, herbs, non-prescription drugs, or dietary supplements you use. Also tell them if you smoke, drink alcohol, or use illegal drugs. Some items may interact with your medicine. What should I watch for while using this medicine? Visit your doctor or health care professional for regular check ups. It may be some time before you see the benefit from this medicine. Do not stop taking your medicine except on your doctor's advice. Your doctor or health care professional may order blood tests  and other tests to see how you are doing. You should make sure you get enough calcium and vitamin D while you are taking this medicine, unless your doctor tells you not to. Discuss the foods you eat and the vitamins you take with your health care professional. Some people who take this medicine have severe bone, joint, and/or muscle pain. This medicine may also increase your risk for a broken thigh bone. Tell your doctor right away if you have pain in your upper leg or groin. Tell your doctor if you have any pain that does not go away or that gets worse. What side effects may I notice from receiving this medicine? Side effects that you should report to your doctor or health care professional as soon as possible: -allergic reactions such as skin rash or itching, hives, swelling of the face, lips, throat, or tongue -changes in vision -chest pain -fever, flu-like symptoms -heartburn or stomach pain -jaw pain, especially after dental work Side effects that usually do not require medical attention (report to your doctor or health care professional if they continue or are bothersome): -bone, muscle or joint pain -diarrhea or constipation -eye pain or itching -headache -irritation at site where injected -nausea This list may not describe all possible side effects. Call your doctor for medical advice about side effects. You may report side effects to FDA at 1-800-FDA-1088. Where should I keep my medicine? This drug is given in a hospital or clinic and will not be stored at home. NOTE: This sheet is a summary. It may not cover all possible information.   If you have questions about this medicine, talk to your doctor, pharmacist, or health care provider.  2012, Elsevier/Gold Standard. (03/14/2011 9:02:20 AM) 

## 2012-03-09 ENCOUNTER — Other Ambulatory Visit: Payer: Self-pay | Admitting: Internal Medicine

## 2012-03-09 MED ORDER — IBANDRONATE SODIUM 3 MG/3ML IV SOLN: 3.0000 mg | Freq: Once | INTRAVENOUS | Status: AC

## 2012-03-10 ENCOUNTER — Encounter (HOSPITAL_COMMUNITY)
Admission: RE | Admit: 2012-03-10 | Discharge: 2012-03-10 | Disposition: A | Discharge status: Home / Self Care | Payer: Medicare Other | Source: Ambulatory Visit | Attending: Internal Medicine | Admitting: Internal Medicine

## 2012-03-10 ENCOUNTER — Encounter (HOSPITAL_COMMUNITY): Payer: Self-pay

## 2012-03-10 DIAGNOSIS — M810 Age-related osteoporosis without current pathological fracture: Secondary | ICD-10-CM | POA: Insufficient documentation

## 2012-03-10 MED ORDER — SODIUM CHLORIDE 0.9 % IV SOLN: INTRAVENOUS | Status: DC

## 2012-03-10 MED ORDER — IBANDRONATE SODIUM 3 MG/3ML IV SOLN
3.0000 mg | Freq: Once | INTRAVENOUS | Status: AC
  Administered 2012-03-10: 3 mg via INTRAVENOUS

## 2012-03-10 MED ORDER — IBANDRONATE SODIUM 3 MG/3ML IV SOLN
INTRAVENOUS | Status: AC
  Administered 2012-03-10: 3 mg via INTRAVENOUS
  Filled 2012-03-10: qty 3

## 2012-03-10 NOTE — Discharge Instructions (Signed)
Ibandronate injection What is this medicine? IBANDRONATE (i BAN droh nate) slows calcium loss from bones. It is used to treat osteoporosis in women past the age of menopause. This medicine may be used for other purposes; ask your health care provider or pharmacist if you have questions. What should I tell my health care provider before I take this medicine? They need to know if you have any of these conditions: -dental disease -kidney disease -low levels of calcium in the blood -low levels of vitamin D in the blood -an unusual or allergic reaction to ibandronate, other medicines, foods, dyes, or preservatives -pregnant or trying to get pregnant -breast-feeding How should I use this medicine? This medicine is for injection into a vein. It is given by a health care professional in a hospital or clinic setting. Talk to your pediatrician regarding the use of this medicine in children. Special care may be needed. Overdosage: If you think you have taken too much of this medicine contact a poison control center or emergency room at once. NOTE: This medicine is only for you. Do not share this medicine with others. What if I miss a dose? It is important not to miss your dose. Call your doctor or health care professional if you are unable to keep an appointment. What may interact with this medicine? -teriparatide This list may not describe all possible interactions. Give your health care provider a list of all the medicines, herbs, non-prescription drugs, or dietary supplements you use. Also tell them if you smoke, drink alcohol, or use illegal drugs. Some items may interact with your medicine. What should I watch for while using this medicine? Visit your doctor or health care professional for regular check ups. It may be some time before you see the benefit from this medicine. Do not stop taking your medicine except on your doctor's advice. Your doctor or health care professional may order blood tests  and other tests to see how you are doing. You should make sure you get enough calcium and vitamin D while you are taking this medicine, unless your doctor tells you not to. Discuss the foods you eat and the vitamins you take with your health care professional. Some people who take this medicine have severe bone, joint, and/or muscle pain. This medicine may also increase your risk for a broken thigh bone. Tell your doctor right away if you have pain in your upper leg or groin. Tell your doctor if you have any pain that does not go away or that gets worse. What side effects may I notice from receiving this medicine? Side effects that you should report to your doctor or health care professional as soon as possible: -allergic reactions such as skin rash or itching, hives, swelling of the face, lips, throat, or tongue -changes in vision -chest pain -fever, flu-like symptoms -heartburn or stomach pain -jaw pain, especially after dental work Side effects that usually do not require medical attention (report to your doctor or health care professional if they continue or are bothersome): -bone, muscle or joint pain -diarrhea or constipation -eye pain or itching -headache -irritation at site where injected -nausea This list may not describe all possible side effects. Call your doctor for medical advice about side effects. You may report side effects to FDA at 1-800-FDA-1088. Where should I keep my medicine? This drug is given in a hospital or clinic and will not be stored at home. NOTE: This sheet is a summary. It may not cover all possible information.   If you have questions about this medicine, talk to your doctor, pharmacist, or health care provider.  2012, Elsevier/Gold Standard. (03/14/2011 9:02:20 AM) 

## 2012-03-18 ENCOUNTER — Encounter: Payer: Self-pay | Admitting: Cardiology

## 2012-03-18 ENCOUNTER — Other Ambulatory Visit: Payer: Self-pay | Admitting: Cardiology

## 2012-06-09 ENCOUNTER — Encounter (HOSPITAL_COMMUNITY)
Admission: RE | Admit: 2012-06-09 | Discharge: 2012-06-09 | Disposition: A | Discharge status: Home / Self Care | Payer: Medicare Other | Source: Ambulatory Visit | Attending: Internal Medicine | Admitting: Internal Medicine

## 2012-06-09 DIAGNOSIS — M810 Age-related osteoporosis without current pathological fracture: Secondary | ICD-10-CM | POA: Insufficient documentation

## 2012-06-09 MED ORDER — IBANDRONATE SODIUM 3 MG/3ML IV SOLN
3.0000 mg | Freq: Once | INTRAVENOUS | Status: AC
  Administered 2012-06-09: 3 mg via INTRAVENOUS
  Filled 2012-06-09: qty 3

## 2012-06-09 MED ORDER — SODIUM CHLORIDE 0.9 % IV SOLN
INTRAVENOUS | Status: DC
  Administered 2012-06-09: 11:00:00 via INTRAVENOUS

## 2012-09-08 ENCOUNTER — Encounter (HOSPITAL_COMMUNITY)
Admission: RE | Admit: 2012-09-08 | Discharge: 2012-09-08 | Disposition: A | Discharge status: Home / Self Care | Payer: Medicare Other | Source: Ambulatory Visit | Attending: Internal Medicine | Admitting: Internal Medicine

## 2012-09-08 ENCOUNTER — Encounter (HOSPITAL_COMMUNITY): Payer: Self-pay

## 2012-09-08 DIAGNOSIS — M810 Age-related osteoporosis without current pathological fracture: Secondary | ICD-10-CM | POA: Insufficient documentation

## 2012-09-08 MED ORDER — IBANDRONATE SODIUM 3 MG/3ML IV SOLN
3.0000 mg | Freq: Once | INTRAVENOUS | Status: AC
  Administered 2012-09-08: 3 mg via INTRAVENOUS
  Filled 2012-09-08: qty 3

## 2012-09-08 MED ORDER — SODIUM CHLORIDE 0.9 % IV SOLN
INTRAVENOUS | Status: DC
  Administered 2012-09-08: 11:00:00 via INTRAVENOUS

## 2012-12-07 ENCOUNTER — Other Ambulatory Visit (HOSPITAL_COMMUNITY): Payer: Self-pay | Admitting: Internal Medicine

## 2012-12-08 ENCOUNTER — Ambulatory Visit (HOSPITAL_COMMUNITY)
Admission: RE | Admit: 2012-12-08 | Discharge: 2012-12-08 | Disposition: A | Discharge status: Home / Self Care | Payer: Medicare Other | Source: Ambulatory Visit | Attending: Internal Medicine | Admitting: Internal Medicine

## 2012-12-08 DIAGNOSIS — M810 Age-related osteoporosis without current pathological fracture: Secondary | ICD-10-CM | POA: Insufficient documentation

## 2012-12-08 MED ORDER — SODIUM CHLORIDE 0.9 % IV SOLN
Freq: Once | INTRAVENOUS | Status: AC
Start: 2012-12-08 — End: 2012-12-08
  Administered 2012-12-08: 11:00:00 via INTRAVENOUS

## 2012-12-08 MED ORDER — IBANDRONATE SODIUM 3 MG/3ML IV SOLN
3.0000 mg | Freq: Once | INTRAVENOUS | Status: AC
  Administered 2012-12-08: 3 mg via INTRAVENOUS
  Filled 2012-12-08: qty 3

## 2012-12-08 NOTE — Progress Notes (Signed)
Pt tolerated Boniva injection without any complaints.  Pt discharged to home with husband.

## 2013-03-09 ENCOUNTER — Encounter (HOSPITAL_COMMUNITY)
Admission: RE | Admit: 2013-03-09 | Discharge: 2013-03-09 | Disposition: A | Discharge status: Home / Self Care | Payer: Medicare Other | Source: Ambulatory Visit | Attending: Internal Medicine | Admitting: Internal Medicine

## 2013-04-15 ENCOUNTER — Encounter (HOSPITAL_COMMUNITY)
Admission: RE | Admit: 2013-04-15 | Discharge: 2013-04-15 | Disposition: A | Discharge status: Home / Self Care | Payer: Medicare Other | Source: Ambulatory Visit | Attending: Internal Medicine | Admitting: Internal Medicine

## 2013-04-15 ENCOUNTER — Encounter (HOSPITAL_COMMUNITY): Payer: Self-pay

## 2013-04-15 DIAGNOSIS — M810 Age-related osteoporosis without current pathological fracture: Secondary | ICD-10-CM | POA: Insufficient documentation

## 2013-04-15 MED ORDER — IBANDRONATE SODIUM 3 MG/3ML IV SOLN
3.0000 mg | Freq: Once | INTRAVENOUS | Status: AC
  Administered 2013-04-15: 3 mg via INTRAVENOUS
  Filled 2013-04-15: qty 3

## 2013-04-15 MED ORDER — SODIUM CHLORIDE 0.9 % IV SOLN
Freq: Once | INTRAVENOUS | Status: AC
  Administered 2013-04-15: 12:00:00 via INTRAVENOUS

## 2013-07-14 ENCOUNTER — Other Ambulatory Visit (HOSPITAL_COMMUNITY): Payer: Self-pay | Admitting: Internal Medicine

## 2013-07-15 ENCOUNTER — Encounter (HOSPITAL_COMMUNITY): Payer: Self-pay

## 2013-07-15 ENCOUNTER — Encounter (HOSPITAL_COMMUNITY)
Admission: RE | Admit: 2013-07-15 | Discharge: 2013-07-15 | Disposition: A | Discharge status: Home / Self Care | Payer: Medicare Other | Source: Ambulatory Visit | Attending: Internal Medicine | Admitting: Internal Medicine

## 2013-07-15 DIAGNOSIS — M810 Age-related osteoporosis without current pathological fracture: Secondary | ICD-10-CM | POA: Insufficient documentation

## 2013-07-15 MED ORDER — SODIUM CHLORIDE 0.9 % IV SOLN
INTRAVENOUS | Status: DC
  Administered 2013-07-15: 20 mL/h via INTRAVENOUS

## 2013-07-15 MED ORDER — IBANDRONATE SODIUM 3 MG/3ML IV SOLN
3.0000 mg | Freq: Once | INTRAVENOUS | Status: AC
  Administered 2013-07-15: 3 mg via INTRAVENOUS
  Filled 2013-07-15: qty 3

## 2013-10-14 ENCOUNTER — Encounter (HOSPITAL_COMMUNITY): Payer: Self-pay

## 2013-10-14 ENCOUNTER — Encounter (HOSPITAL_COMMUNITY)
Admission: RE | Admit: 2013-10-14 | Discharge: 2013-10-14 | Disposition: A | Discharge status: Home / Self Care | Payer: Medicare Other | Source: Ambulatory Visit | Attending: Internal Medicine | Admitting: Internal Medicine

## 2013-10-14 DIAGNOSIS — M810 Age-related osteoporosis without current pathological fracture: Secondary | ICD-10-CM | POA: Insufficient documentation

## 2013-10-14 MED ORDER — SODIUM CHLORIDE 0.9 % IV SOLN
INTRAVENOUS | Status: DC
  Administered 2013-10-14: 12:00:00 via INTRAVENOUS

## 2013-10-14 MED ORDER — IBANDRONATE SODIUM 3 MG/3ML IV SOLN
3.0000 mg | Freq: Once | INTRAVENOUS | Status: AC
  Administered 2013-10-14: 3 mg via INTRAVENOUS
  Filled 2013-10-14: qty 3

## 2013-10-14 NOTE — Discharge Instructions (Signed)
Ibandronate injection °What is this medicine? °IBANDRONATE (i BAN droh nate) slows calcium loss from bones. It is used to treat osteoporosis in women past the age of menopause. °This medicine may be used for other purposes; ask your health care provider or pharmacist if you have questions. °COMMON BRAND NAME(S): Boniva °What should I tell my health care provider before I take this medicine? °They need to know if you have any of these conditions: °-dental disease °-kidney disease °-low levels of calcium in the blood °-low levels of vitamin D in the blood °-an unusual or allergic reaction to ibandronate, other medicines, foods, dyes, or preservatives °-pregnant or trying to get pregnant °-breast-feeding °How should I use this medicine? °This medicine is for injection into a vein. It is given by a health care professional in a hospital or clinic setting. °Talk to your pediatrician regarding the use of this medicine in children. Special care may be needed. °Overdosage: If you think you have taken too much of this medicine contact a poison control center or emergency room at once. °NOTE: This medicine is only for you. Do not share this medicine with others. °What if I miss a dose? °It is important not to miss your dose. Call your doctor or health care professional if you are unable to keep an appointment. °What may interact with this medicine? °-teriparatide °This list may not describe all possible interactions. Give your health care provider a list of all the medicines, herbs, non-prescription drugs, or dietary supplements you use. Also tell them if you smoke, drink alcohol, or use illegal drugs. Some items may interact with your medicine. °What should I watch for while using this medicine? °Visit your doctor or health care professional for regular check ups. It may be some time before you see the benefit from this medicine. Do not stop taking your medicine except on your doctor's advice. Your doctor or health care  professional may order blood tests and other tests to see how you are doing. °You should make sure you get enough calcium and vitamin D while you are taking this medicine, unless your doctor tells you not to. Discuss the foods you eat and the vitamins you take with your health care professional. °Some people who take this medicine have severe bone, joint, and/or muscle pain. This medicine may also increase your risk for a broken thigh bone. Tell your doctor right away if you have pain in your upper leg or groin. Tell your doctor if you have any pain that does not go away or that gets worse. °What side effects may I notice from receiving this medicine? °Side effects that you should report to your doctor or health care professional as soon as possible: °-allergic reactions such as skin rash or itching, hives, swelling of the face, lips, throat, or tongue °-changes in vision °-chest pain °-fever, flu-like symptoms °-heartburn or stomach pain °-jaw pain, especially after dental work °Side effects that usually do not require medical attention (report to your doctor or health care professional if they continue or are bothersome): °-bone, muscle or joint pain °-diarrhea or constipation °-eye pain or itching °-headache °-irritation at site where injected °-nausea °This list may not describe all possible side effects. Call your doctor for medical advice about side effects. You may report side effects to FDA at 1-800-FDA-1088. °Where should I keep my medicine? °This drug is given in a hospital or clinic and will not be stored at home. °NOTE: This sheet is a summary. It may not   cover all possible information. If you have questions about this medicine, talk to your doctor, pharmacist, or health care provider.  2014, Elsevier/Gold Standard. (2011-03-14 09:02:20) Osteoporosis Throughout your life, your body breaks down old bone and replaces it with new bone. As you get older, your body does not replace bone as quickly as it  breaks it down. By the age of 30 years, most people begin to gradually lose bone because of the imbalance between bone loss and replacement. Some people lose more bone than others. Bone loss beyond a specified normal degree is considered osteoporosis.  Osteoporosis affects the strength and durability of your bones. The inside of the ends of your bones and your flat bones, like the bones of your pelvis, look like honeycomb, filled with tiny open spaces. As bone loss occurs, your bones become less dense. This means that the open spaces inside your bones become bigger and the walls between these spaces become thinner. This makes your bones weaker. Bones of a person with osteoporosis can become so weak that they can break (fracture) during minor accidents, such as a simple fall. CAUSES  The following factors have been associated with the development of osteoporosis:  Smoking.  Drinking more than 2 alcoholic drinks several days per week.  Long-term use of certain medicines:  Corticosteroids.  Chemotherapy medicines.  Thyroid medicines.  Antiepileptic medicines.  Gonadal hormone suppression medicine.  Immunosuppression medicine.  Being underweight.  Lack of physical activity.  Lack of exposure to the sun. This can lead to vitamin D deficiency.  Certain medical conditions:  Certain inflammatory bowel diseases, such as Crohn disease and ulcerative colitis.  Diabetes.  Hyperthyroidism.  Hyperparathyroidism. RISK FACTORS Anyone can develop osteoporosis. However, the following factors can increase your risk of developing osteoporosis:  Gender Women are at higher risk than men.  Age Being older than 50 years increases your risk.  Ethnicity White and Asian people have an increased risk.  Weight Being extremely underweight can increase your risk of osteoporosis.  Family history of osteoporosis Having a family member who has developed osteoporosis can increase your risk. SYMPTOMS   Usually, people with osteoporosis have no symptoms.  DIAGNOSIS  Signs during a physical exam that may prompt your caregiver to suspect osteoporosis include:  Decreased height. This is usually caused by the compression of the bones that form your spine (vertebrae) because they have weakened and become fractured.  A curving or rounding of the upper back (kyphosis). To confirm signs of osteoporosis, your caregiver may request a procedure that uses 2 low-dose X-ray beams with different levels of energy to measure your bone mineral density (dual-energy X-ray absorptiometry [DXA]). Also, your caregiver may check your level of vitamin D. TREATMENT  The goal of osteoporosis treatment is to strengthen bones in order to decrease the risk of bone fractures. There are different types of medicines available to help achieve this goal. Some of these medicines work by slowing the processes of bone loss. Some medicines work by increasing bone density. Treatment also involves making sure that your levels of calcium and vitamin D are adequate. PREVENTION  There are things you can do to help prevent osteoporosis. Adequate intake of calcium and vitamin D can help you achieve optimal bone mineral density. Regular exercise can also help, especially resistance and weight-bearing activities. If you smoke, quitting smoking is an important part of osteoporosis prevention. MAKE SURE YOU:  Understand these instructions.  Will watch your condition.  Will get help right away if you are  not doing well or get worse. FOR MORE INFORMATION www.osteo.org and [email or web address] Document Released: 06/25/2005 Document Revised: 01/10/2013 Document Reviewed: 08/30/2011 [facility] Patient Information 2014 [place], [place].

## 2013-11-07 ENCOUNTER — Ambulatory Visit (INDEPENDENT_AMBULATORY_CARE_PROVIDER_SITE_OTHER): Payer: Medicare Other | Admitting: Cardiology

## 2013-11-07 ENCOUNTER — Encounter: Payer: Self-pay | Admitting: Cardiology

## 2013-11-07 VITALS — BP 114/66 | HR 84 | Ht 65.7 in | Wt 145.8 lb

## 2013-11-07 DIAGNOSIS — M069 Rheumatoid arthritis, unspecified: Secondary | ICD-10-CM

## 2013-11-07 DIAGNOSIS — E119 Type 2 diabetes mellitus without complications: Secondary | ICD-10-CM

## 2013-11-07 DIAGNOSIS — IMO0001 Reserved for inherently not codable concepts without codable children: Secondary | ICD-10-CM

## 2013-11-07 DIAGNOSIS — R079 Chest pain, unspecified: Secondary | ICD-10-CM | POA: Insufficient documentation

## 2013-11-07 NOTE — Patient Instructions (Signed)
We will schedule you for a nuclear stress test   

## 2013-11-07 NOTE — Progress Notes (Signed)
Patricia Mosley Date of Birth: 1934-03-18 Medical Record #[id number]  History of Present Illness: Patricia Mosley is seen today at the request of Dr. Waynard Edwards for evaluation of chest pain. She is a 80 yo WF with history of RA, diabetes mellitus, hiatal hernia and CKD. She had a cardiac cath in 08/2008 which was normal. She had EGD in 2012 which showed a hiatal hernia but otherwise normal. She also has a history of costochondritis. Recently she reports symptoms of increased chest pain. She states the pain is midsternal and radiates into her neck. It is sharp with sudden onset. She states the pain is so bad she has to scream. It typically occurs with eating but not always. It is relieved with induced vomiting. 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 She admits to some increase in stress. Was recently placed on Protonix from Zantac. She has a Rx for Ntg but hasn't used it yet.   Current Outpatient Prescriptions on File Prior to Visit  Medication Sig Dispense Refill  . albuterol (PROVENTIL HFA) 108 (90 BASE) MCG/ACT inhaler Inhale into the lungs every 6 (six) hours as needed for wheezing or shortness of breath.      . calcium-vitamin D (OSCAL WITH D) 500-200 MG-UNIT per tablet Take 1 tablet by mouth daily.        . cholecalciferol (VITAMIN D) 1000 UNITS tablet Take 1,000 Units by mouth daily.      Marland Kitchen FLUoxetine (PROZAC) 20 MG capsule Take 20 mg by mouth daily.        . folic acid (FOLVITE) 1 MG tablet Take 1 mg by mouth 2 (two) times daily.        . hydroxychloroquine (PLAQUENIL) 200 MG tablet Take 200 mg by mouth 2 (two) times daily.        Marland Kitchen ibandronate (BONIVA) 3 MG/3ML SOLN injection Inject 3 mLs (3 mg total) into the vein once.  3.6 mL  0  . LORazepam (ATIVAN) 0.5 MG tablet Take 0.5 mg by mouth Nightly.        Marland Kitchen LORazepam (ATIVAN) 0.5 MG tablet Take 0.5 mg by mouth every 8 (eight) hours as needed.        . pantoprazole (PROTONIX) 20 MG tablet Take 40 mg by mouth daily.       . predniSONE  (DELTASONE) 5 MG tablet Take 7 mg by mouth daily.       . promethazine (PHENERGAN) 12.5 MG tablet Take 12.5 mg by mouth every 6 (six) hours as needed.        . ranitidine (ZANTAC) 150 MG capsule Take 150 mg by mouth 2 (two) times daily.        . sitaGLIPtin (JANUVIA) 50 MG tablet Take 100 mg by mouth daily.       . traMADol (ULTRAM) 50 MG tablet Take 50 mg by mouth every 6 (six) hours as needed.       No current facility-administered medications on file prior to visit.    Allergies  Allergen Reactions  . Adalimumab   . Codeine   . Doxycycline   . Epinephrine   . Gabapentin   . Infliximab   . Meperidine Hcl   . Methocarbamol   . Methotrexate   . Metronidazole   . Penicillins   . Pentazocine Lactate   . Risedronate Sodium     Tolerates IV boniva  . Sulfonamide Derivatives   . Tetracycline     Past Medical History  Diagnosis Date  .  Diabetes mellitus     "prednisone induced" per patient  . Osteoporosis   . Rheumatoid arthritis(714.0)   . Fibromyalgia     Past Surgical History  Procedure Laterality Date  . Tonsillectomy    . "womb suspension" and appendectomy      age 64  . Dilation and curettage of uterus    . Cystoscopy      evaluating UTI  . Cardiac catheterization      "neg cardiac only stress related"    History  Smoking status  . Never Smoker   Smokeless tobacco  . Not on file    History  Alcohol Use No    Family History  Problem Relation Age of Onset  . Heart disease Mother     Review of Systems: As noted in HPI.  All other systems were reviewed and are negative.  Physical Exam: BP 114/66  Pulse 84  Ht 5' 5.7" (1.669 m)  Wt 145 lb 12.8 oz (66.134 kg)  BMI 23.74 kg/m2 She is a pleasant elderly WF in NAD. HEENT: [place]/AT, PERRL, EOMI, sclera are clear. Oropharynx is clear. Neck: no JVD, adenopathy, bruits, or thyromegaly. Lungs: clear CV: RRR, normal S1-2, no murmur, gallop, or rub. Chest wall is positive for tenderness to palpation in  several parasternal areas. Abdomen: soft, NT, BS positive. No masses or HSM Ext: no edema, pulses 2+ Skin: warm and dry Neuro: alert a O x 3. CN II-XII intact.  LABORATORY DATA: Ecg: NSR LAFB, Nonspecific TWA  Assessment / Plan: 1. Chest pain with atypical features. History is more suggestive of esophageal spasm with sudden intense onset in association with meals. Doubt cardiac etiology although she does have multiple cardiac risk factors. Normal cardiac cath in 2009. She does have background pain of costochondritis but I don't think this is her acute pain. We will schedule her for a stress myoview. Use Ntg prn. If stress test is negative would consider GI evaluation.  2. RA   3. Diabetes mellitus  4. Costochondritis.  5. CKD.

## 2013-11-23 ENCOUNTER — Encounter (HOSPITAL_COMMUNITY): Payer: Medicare Other

## 2013-11-25 ENCOUNTER — Ambulatory Visit (HOSPITAL_COMMUNITY): Payer: Medicare Other | Attending: Cardiology | Admitting: Radiology

## 2013-11-25 VITALS — BP 151/89 | Ht 65.7 in | Wt 145.0 lb

## 2013-11-25 DIAGNOSIS — R0989 Other specified symptoms and signs involving the circulatory and respiratory systems: Secondary | ICD-10-CM | POA: Insufficient documentation

## 2013-11-25 DIAGNOSIS — R079 Chest pain, unspecified: Secondary | ICD-10-CM | POA: Insufficient documentation

## 2013-11-25 DIAGNOSIS — R0602 Shortness of breath: Secondary | ICD-10-CM

## 2013-11-25 DIAGNOSIS — R0609 Other forms of dyspnea: Secondary | ICD-10-CM | POA: Insufficient documentation

## 2013-11-25 DIAGNOSIS — E119 Type 2 diabetes mellitus without complications: Secondary | ICD-10-CM | POA: Insufficient documentation

## 2013-11-25 MED ORDER — TECHNETIUM TC 99M SESTAMIBI GENERIC - CARDIOLITE
33.0000 | Freq: Once | INTRAVENOUS | Status: AC | PRN
  Administered 2013-11-25: 33 via INTRAVENOUS

## 2013-11-25 MED ORDER — TECHNETIUM TC 99M SESTAMIBI GENERIC - CARDIOLITE
11.0000 | Freq: Once | INTRAVENOUS | Status: AC | PRN
  Administered 2013-11-25: 11 via INTRAVENOUS

## 2013-11-25 NOTE — Progress Notes (Signed)
[facility] SITE 3 NUCLEAR MED [address] [place], [place] [address] [phone]    Cardiology Nuclear Med Study  Patricia Mosley is a 80 y.o. female     MRN : [id number]     DOB: [birth year]  Procedure Date: 11/25/2013  Nuclear Med Background Indication for Stress Test:  Evaluation for Ischemia History:  2009 Echo EF 55-60%, Normal Cath Cardiac Risk Factors: NIDDM  Symptoms:  Chest Pain and DOE   Nuclear Pre-Procedure Caffeine/Decaff Intake:  None NPO After: 6:30am   Lungs:  clear O2 Sat: 94% on room air. IV 0.9% NS with Angio Cath:  22g  IV Site: R Antecubital  IV Started by:  Bonnita Levan, RN  Chest Size (in):  38 Cup Size: DD  Height: 5' 5.7" (1.669 m)  Weight:  145 lb (65.772 kg)  BMI:  Body mass index is 23.61 kg/(m^2). Tech Comments:  N/A    Nuclear Med Study 1 or 2 day study: 1 day  Stress Test Type:  Stress  Reading MD: N/A  Order Authorizing Provider:  Peter [place], MD  Resting Radionuclide: Technetium 57m Sestamibi  Resting Radionuclide Dose: 11.0 mCi   Stress Radionuclide:  Technetium 54m Sestamibi  Stress Radionuclide Dose: 33.0 mCi           Stress Protocol Rest HR: 73 Stress HR: 148  Rest BP: 151/89 Stress BP: 192/84  Exercise Time (min): 4:55 METS: 6.7   Predicted Max HR: 142 bpm % Max HR: 104.23 bpm Rate Pressure Product: 03491   Dose of Adenosine (mg):  n/a Dose of Lexiscan: n/a mg  Dose of Atropine (mg): n/a Dose of Dobutamine: n/a mcg/kg/min (at max HR)  Stress Test Technologist: Bonnita Levan, RN  Nuclear Technologist:  Doyne Keel, CNMT     Rest Procedure:  Myocardial perfusion imaging was performed at rest 45 minutes following the intravenous administration of Technetium 78m Sestamibi. Rest ECG: NSR with non-specific ST-T wave changes  Stress Procedure:  The patient exercised on the treadmill utilizing the Bruce Protocol for 4:55 minutes. The patient stopped due to dyspnea and denied any chest pain.  Technetium 56m Sestamibi  was injected at peak exercise and myocardial perfusion imaging was performed after a brief delay. Stress ECG: No significant ST segment change suggestive of ischemia.  QPS Raw Data Images:  Acquisition technically good; normal left ventricular size. Stress Images:  Normal homogeneous uptake in all areas of the myocardium. Rest Images:  Normal homogeneous uptake in all areas of the myocardium. Subtraction (SDS):  No evidence of ischemia. Transient Ischemic Dilatation (Normal <1.22):  0.82 Lung/Heart Ratio (Normal <0.45):  0.33  Quantitative Gated Spect Images QGS EDV:  65 ml QGS ESV:  22 ml  Impression Exercise Capacity:  Fair exercise capacity. BP Response:  Normal blood pressure response. Clinical Symptoms:  There is dyspnea. ECG Impression:  No significant ST segment change suggestive of ischemia. Comparison with Prior Nuclear Study: No images to compare  Overall Impression:  Normal stress nuclear study. Note oxygen saturations decreased to 86% with peak exercise.  LV Ejection Fraction: 66%.  LV Wall Motion:  NL LV Function; NL Wall Motion   Olga Millers

## 2014-01-13 ENCOUNTER — Encounter (HOSPITAL_COMMUNITY)
Admission: RE | Admit: 2014-01-13 | Discharge: 2014-01-13 | Disposition: A | Discharge status: Home / Self Care | Payer: Medicare Other | Source: Ambulatory Visit | Attending: Internal Medicine | Admitting: Internal Medicine

## 2014-01-13 DIAGNOSIS — M810 Age-related osteoporosis without current pathological fracture: Secondary | ICD-10-CM | POA: Insufficient documentation

## 2014-01-31 ENCOUNTER — Encounter (HOSPITAL_COMMUNITY): Payer: Self-pay

## 2014-01-31 ENCOUNTER — Encounter (HOSPITAL_COMMUNITY)
Admission: RE | Admit: 2014-01-31 | Discharge: 2014-01-31 | Disposition: A | Discharge status: Home / Self Care | Payer: Medicare Other | Source: Ambulatory Visit | Attending: Internal Medicine | Admitting: Internal Medicine

## 2014-01-31 DIAGNOSIS — M810 Age-related osteoporosis without current pathological fracture: Secondary | ICD-10-CM | POA: Insufficient documentation

## 2014-01-31 MED ORDER — SODIUM CHLORIDE 0.9 % IV SOLN
250.0000 mL | Freq: Once | INTRAVENOUS | Status: AC
  Administered 2014-01-31: 250 mL via INTRAVENOUS

## 2014-01-31 MED ORDER — IBANDRONATE SODIUM 3 MG/3ML IV SOLN
3.0000 mg | Freq: Once | INTRAVENOUS | Status: AC
  Administered 2014-01-31: 3 mg via INTRAVENOUS
  Filled 2014-01-31: qty 3

## 2014-05-03 ENCOUNTER — Other Ambulatory Visit (HOSPITAL_COMMUNITY): Payer: Self-pay | Admitting: Internal Medicine

## 2014-05-04 ENCOUNTER — Encounter (HOSPITAL_COMMUNITY): Admission: RE | Admit: 2014-05-04 | Payer: Medicare Other | Source: Ambulatory Visit

## 2014-05-17 ENCOUNTER — Encounter (HOSPITAL_COMMUNITY)
Admission: RE | Admit: 2014-05-17 | Discharge: 2014-05-17 | Disposition: A | Discharge status: Home / Self Care | Payer: Medicare Other | Source: Ambulatory Visit | Attending: Internal Medicine | Admitting: Internal Medicine

## 2014-05-17 ENCOUNTER — Encounter (HOSPITAL_COMMUNITY): Payer: Self-pay

## 2014-05-17 DIAGNOSIS — M810 Age-related osteoporosis without current pathological fracture: Secondary | ICD-10-CM | POA: Diagnosis not present

## 2014-05-17 MED ORDER — SODIUM CHLORIDE 0.9 % IV SOLN
INTRAVENOUS | Status: AC
  Administered 2014-05-17: 13:00:00 via INTRAVENOUS

## 2014-05-17 MED ORDER — IBANDRONATE SODIUM 3 MG/3ML IV SOLN
3.0000 mg | Freq: Once | INTRAVENOUS | Status: AC
  Administered 2014-05-17: 3 mg via INTRAVENOUS
  Filled 2014-05-17: qty 3

## 2014-07-07 ENCOUNTER — Emergency Department (HOSPITAL_COMMUNITY): Payer: Medicare Other

## 2014-07-07 ENCOUNTER — Emergency Department (HOSPITAL_COMMUNITY)
Admission: EM | Admit: 2014-07-07 | Discharge: 2014-07-07 | Disposition: A | Discharge status: Home / Self Care | Payer: Medicare Other | Attending: Emergency Medicine | Admitting: Emergency Medicine

## 2014-07-07 ENCOUNTER — Encounter (HOSPITAL_COMMUNITY): Payer: Self-pay | Admitting: Emergency Medicine

## 2014-07-07 DIAGNOSIS — M069 Rheumatoid arthritis, unspecified: Secondary | ICD-10-CM | POA: Insufficient documentation

## 2014-07-07 DIAGNOSIS — M797 Fibromyalgia: Secondary | ICD-10-CM | POA: Diagnosis not present

## 2014-07-07 DIAGNOSIS — M810 Age-related osteoporosis without current pathological fracture: Secondary | ICD-10-CM | POA: Insufficient documentation

## 2014-07-07 DIAGNOSIS — Z880 Allergy status to penicillin: Secondary | ICD-10-CM | POA: Insufficient documentation

## 2014-07-07 DIAGNOSIS — Z9089 Acquired absence of other organs: Secondary | ICD-10-CM | POA: Insufficient documentation

## 2014-07-07 DIAGNOSIS — Z794 Long term (current) use of insulin: Secondary | ICD-10-CM | POA: Diagnosis not present

## 2014-07-07 DIAGNOSIS — R1013 Epigastric pain: Secondary | ICD-10-CM | POA: Diagnosis not present

## 2014-07-07 DIAGNOSIS — Z7952 Long term (current) use of systemic steroids: Secondary | ICD-10-CM | POA: Diagnosis not present

## 2014-07-07 DIAGNOSIS — Z79899 Other long term (current) drug therapy: Secondary | ICD-10-CM | POA: Diagnosis not present

## 2014-07-07 DIAGNOSIS — K449 Diaphragmatic hernia without obstruction or gangrene: Secondary | ICD-10-CM

## 2014-07-07 DIAGNOSIS — R1010 Upper abdominal pain, unspecified: Secondary | ICD-10-CM | POA: Diagnosis present

## 2014-07-07 DIAGNOSIS — E119 Type 2 diabetes mellitus without complications: Secondary | ICD-10-CM | POA: Insufficient documentation

## 2014-07-07 LAB — LIPASE, BLOOD: Lipase: 67 U/L — ABNORMAL HIGH (ref 11–59)

## 2014-07-07 LAB — CBC WITH DIFFERENTIAL/PLATELET
Basophils Absolute: 0 10*3/uL (ref 0.0–0.1)
Basophils Relative: 0 % (ref 0–1)
EOS PCT: 1 % (ref 0–5)
Eosinophils Absolute: 0.1 10*3/uL (ref 0.0–0.7)
HCT: 36.7 % (ref 36.0–46.0)
HEMOGLOBIN: 11.8 g/dL — AB (ref 12.0–15.0)
LYMPHS ABS: 2.5 10*3/uL (ref 0.7–4.0)
LYMPHS PCT: 21 % (ref 12–46)
MCH: 26 pg (ref 26.0–34.0)
MCHC: 32.2 g/dL (ref 30.0–36.0)
MCV: 80.8 fL (ref 78.0–100.0)
MONO ABS: 1.5 10*3/uL — AB (ref 0.1–1.0)
MONOS PCT: 13 % — AB (ref 3–12)
Neutro Abs: 8 10*3/uL — ABNORMAL HIGH (ref 1.7–7.7)
Neutrophils Relative %: 65 % (ref 43–77)
Platelets: 442 10*3/uL — ABNORMAL HIGH (ref 150–400)
RBC: 4.54 MIL/uL (ref 3.87–5.11)
RDW: 14.7 % (ref 11.5–15.5)
WBC: 12.2 10*3/uL — ABNORMAL HIGH (ref 4.0–10.5)

## 2014-07-07 LAB — I-STAT TROPONIN, ED: TROPONIN I, POC: 0 ng/mL (ref 0.00–0.08)

## 2014-07-07 LAB — COMPREHENSIVE METABOLIC PANEL
ALK PHOS: 37 U/L — AB (ref 39–117)
ALT: 15 U/L (ref 0–35)
ANION GAP: 12 (ref 5–15)
AST: 27 U/L (ref 0–37)
Albumin: 3.5 g/dL (ref 3.5–5.2)
BILIRUBIN TOTAL: 0.3 mg/dL (ref 0.3–1.2)
BUN: 18 mg/dL (ref 6–23)
CHLORIDE: 101 meq/L (ref 96–112)
CO2: 25 meq/L (ref 19–32)
CREATININE: 0.87 mg/dL (ref 0.50–1.10)
Calcium: 8.9 mg/dL (ref 8.4–10.5)
GFR calc Af Amer: 72 mL/min — ABNORMAL LOW (ref >90)
GFR, EST NON AFRICAN AMERICAN: 62 mL/min — AB (ref >90)
Glucose, Bld: 129 mg/dL — ABNORMAL HIGH (ref 70–99)
Potassium: 4.2 mEq/L (ref 3.7–5.3)
Sodium: 138 mEq/L (ref 137–147)
Total Protein: 6.8 g/dL (ref 6.0–8.3)

## 2014-07-07 LAB — URINALYSIS, ROUTINE W REFLEX MICROSCOPIC
BILIRUBIN URINE: NEGATIVE
GLUCOSE, UA: NEGATIVE mg/dL
Hgb urine dipstick: NEGATIVE
Ketones, ur: NEGATIVE mg/dL
Leukocytes, UA: NEGATIVE
Nitrite: NEGATIVE
PROTEIN: NEGATIVE mg/dL
Specific Gravity, Urine: 1.037 — ABNORMAL HIGH (ref 1.005–1.030)
UROBILINOGEN UA: 0.2 mg/dL (ref 0.0–1.0)
pH: 7 (ref 5.0–8.0)

## 2014-07-07 MED ORDER — IOHEXOL 300 MG/ML  SOLN
25.0000 mL | Freq: Once | INTRAMUSCULAR | Status: AC | PRN
  Administered 2014-07-07: 25 mL via ORAL

## 2014-07-07 MED ORDER — DICYCLOMINE HCL 10 MG/ML IM SOLN
20.0000 mg | Freq: Once | INTRAMUSCULAR | Status: AC
  Administered 2014-07-07: 20 mg via INTRAMUSCULAR
  Filled 2014-07-07: qty 2

## 2014-07-07 MED ORDER — MORPHINE SULFATE 2 MG/ML IJ SOLN
2.0000 mg | Freq: Once | INTRAMUSCULAR | Status: AC
  Administered 2014-07-07: 2 mg via INTRAMUSCULAR
  Filled 2014-07-07: qty 1

## 2014-07-07 MED ORDER — MORPHINE SULFATE 2 MG/ML IJ SOLN
2.0000 mg | INTRAMUSCULAR | Status: DC | PRN
  Administered 2014-07-07: 2 mg via INTRAVENOUS
  Filled 2014-07-07: qty 1

## 2014-07-07 MED ORDER — SUCRALFATE 1 G PO TABS: 1.0000 g | ORAL_TABLET | Freq: Four times a day (QID) | ORAL | Status: AC

## 2014-07-07 MED ORDER — IOHEXOL 300 MG/ML  SOLN
80.0000 mL | Freq: Once | INTRAMUSCULAR | Status: AC | PRN
  Administered 2014-07-07: 80 mL via INTRAVENOUS

## 2014-07-07 NOTE — ED Notes (Signed)
After removing pts IV and holding pressure to area pt developed a hematoma to left forearm. Pressure dressing applied and ice pack given. Greta Doom, PA saw pt and ordered morphine. Pt tearful husband at bedside.

## 2014-07-07 NOTE — Discharge Instructions (Signed)
Your workup today has shown your hiatal hernia, but no other new findings.  Please follow up with your doctor for recheck in 3-5 days.  Return to the ER for worsening condition or new concerning symptoms.  Stick to a bland diet until feeling better.   Abdominal Pain Many things can cause abdominal pain. Usually, abdominal pain is not caused by a disease and will improve without treatment. It can often be observed and treated at home. Your health care provider will do a physical exam and possibly order blood tests and X-rays to help determine the seriousness of your pain. However, in many cases, more time must pass before a clear cause of the pain can be found. Before that point, your health care provider may not know if you need more testing or further treatment. HOME CARE INSTRUCTIONS  Monitor your abdominal pain for any changes. The following actions may help to alleviate any discomfort you are experiencing:  Only take over-the-counter or prescription medicines as directed by your health care provider.  Do not take laxatives unless directed to do so by your health care provider.  Try a clear liquid diet (broth, tea, or water) as directed by your health care provider. Slowly move to a bland diet as tolerated. SEEK MEDICAL CARE IF:  You have unexplained abdominal pain.  You have abdominal pain associated with nausea or diarrhea.  You have pain when you urinate or have a bowel movement.  You experience abdominal pain that wakes you in the night.  You have abdominal pain that is worsened or improved by eating food.  You have abdominal pain that is worsened with eating fatty foods.  You have a fever. SEEK IMMEDIATE MEDICAL CARE IF:   Your pain does not go away within 2 hours.  You keep throwing up (vomiting).  Your pain is felt only in portions of the abdomen, such as the right side or the left lower portion of the abdomen.  You pass bloody or black tarry stools. MAKE SURE  YOU:  Understand these instructions.   Will watch your condition.   Will get help right away if you are not doing well or get worse.  Document Released: 06/25/2005 Document Revised: 09/20/2013 Document Reviewed: 05/25/2013 [facility] Patient Information 2015 [place], [place]. This information is not intended to replace advice given to you by your health care provider. Make sure you discuss any questions you have with your health care provider.  Hiatal Hernia A hiatal hernia occurs when part of your stomach slides above the muscle that separates your abdomen from your chest (diaphragm). You can be born with a hiatal hernia (congenital), or it may develop over time. In almost all cases of hiatal hernia, only the top part of the stomach pushes through.  Many people have a hiatal hernia with no symptoms. The larger the hernia, the more likely that you will have symptoms. In some cases, a hiatal hernia allows stomach acid to flow back into the tube that carries food from your mouth to your stomach (esophagus). This may cause heartburn symptoms. Severe heartburn symptoms may mean you have developed a condition called gastroesophageal reflux disease (GERD).  CAUSES  Hiatal hernias are caused by a weakness in the opening (hiatus) where your esophagus passes through your diaphragm to attach to the upper part of your stomach. You may be born with a weakness in your hiatus, or a weakness can develop. RISK FACTORS Older age is a major risk factor for a hiatal hernia. Anything that  increases pressure on your diaphragm can also increase your risk of a hiatal hernia. This includes:  Pregnancy.  Excess weight.  Frequent constipation. SIGNS AND SYMPTOMS  People with a hiatal hernia often have no symptoms. If symptoms develop, they are almost always caused by GERD. They may include:  Heartburn.  Belching.  Indigestion.  Trouble swallowing.  Coughing or wheezing.  Sore  throat.  Hoarseness.  Chest pain. DIAGNOSIS  A hiatal hernia is sometimes found during an exam for another problem. Your health care provider may suspect a hiatal hernia if you have symptoms of GERD. Tests may be done to diagnose GERD. These may include:  X-rays of your stomach or chest.  An upper gastrointestinal (GI) series. This is an X-ray exam of your GI tract involving the use of a chalky liquid that you swallow. The liquid shows up clearly on the X-ray.  Endoscopy. This is a procedure to look into your stomach using a thin, flexible tube that has a tiny camera and light on the end of it. TREATMENT  If you have no symptoms, you may not need treatment. If you have symptoms, treatment may include:  Dietary and lifestyle changes to help reduce GERD symptoms.  Medicines. These may include:  Over-the-counter antacids.  Medicines that make your stomach empty more quickly.  Medicines that block the production of stomach acid (H2 blockers).  Stronger medicines to reduce stomach acid (proton pump inhibitors).  You may need surgery to repair the hernia if other treatments are not helping. HOME CARE INSTRUCTIONS   Take all medicines as directed by your health care provider.  Quit smoking, if you smoke.  Try to achieve and maintain a healthy body weight.  Eat frequent small meals instead of three large meals a day. This keeps your stomach from getting too full.  Eat slowly.  Do not lie down right after eating.  Do noteat 1-2 hours before bed.   Do not drink beverages with caffeine. These include cola, coffee, cocoa, and tea.  Do not drink alcohol.  Avoid foods that can make symptoms of GERD worse. These may include:  Fatty foods.  Citrus fruits.  Other foods and drinks that contain acid.  Avoid putting pressure on your belly. Anything that puts pressure on your belly increases the amount of acid that may be pushed up into your esophagus.   Avoid bending over,  especially after eating.  Raise the head of your bed by putting blocks under the legs. This keeps your head and esophagus higher than your stomach.  Do not wear tight clothing around your chest or stomach.  Try not to strain when having a bowel movement, when urinating, or when lifting heavy objects. SEEK MEDICAL CARE IF:  Your symptoms are not controlled with medicines or lifestyle changes.  You are having trouble swallowing.  You have coughing or wheezing that will not go away. SEEK IMMEDIATE MEDICAL CARE IF:  Your pain is getting worse.  Your pain spreads to your arms, neck, jaw, teeth, or back.  You have shortness of breath.  You sweat for no reason.  You feel sick to your stomach (nauseous) or vomit.  You vomit blood.  You have bright red blood in your stools.  You have black, tarry stools.  Document Released: 12/06/2003 Document Revised: 01/30/2014 Document Reviewed: 09/02/2013 [facility] Patient Information 2015 [place], [place]. This information is not intended to replace advice given to you by your health care provider. Make sure you discuss any questions you have  with your health care provider.

## 2014-07-07 NOTE — ED Notes (Signed)
Ct notified that pt is finished drinking contrast.

## 2014-07-07 NOTE — ED Provider Notes (Signed)
CSN: [id number]     Arrival date & time 07/07/14  0206 History   First MD Initiated Contact with Patient 07/07/14 [phone]     Chief Complaint  Patient presents with  . Abdominal Pain     (Consider location/radiation/quality/duration/timing/severity/associated sxs/prior Treatment) HPI 80 yo female presents to the ER from home via EMS with complaint of upper abdominal pain.  She reports pain started around 6 pm before having dinner and worsened throughout the night.  Pt denies nausea, vomiting, diarrhea or constipation.  Pt has h/o fibromyalgia and RA, DM.  She is s/p appy and uterus procedure.  Denies prior similar pain.  No chest pain, no shortness of breath, no fever or chills.  Pain is sharp, burning, severe, crampy.   Past Medical History  Diagnosis Date  . Diabetes mellitus     "prednisone induced" per patient  . Osteoporosis   . Rheumatoid arthritis(714.0)   . Fibromyalgia    Past Surgical History  Procedure Laterality Date  . Tonsillectomy    . "womb suspension" and appendectomy      age 81  . Dilation and curettage of uterus    . Cystoscopy      evaluating UTI   Family History  Problem Relation Age of Onset  . Heart disease Mother    History  Substance Use Topics  . Smoking status: Never Smoker   . Smokeless tobacco: Not on file  . Alcohol Use: No   OB History   Grav Para Term Preterm Abortions TAB SAB Ect Mult Living                 Review of Systems  All other systems reviewed and are negative.     Allergies  Adalimumab; Codeine; Doxycycline; Epinephrine; Gabapentin; Infliximab; Meperidine hcl; Methocarbamol; Methotrexate; Metronidazole; Penicillins; Pentazocine lactate; Risedronate sodium; Sulfonamide derivatives; Tetracycline; and Tramadol  Home Medications   Prior to Admission medications   Medication Sig Start Date End Date Taking? Authorizing Provider  calcium-vitamin D (OSCAL WITH D) 500-200 MG-UNIT per tablet Take 1 tablet by mouth daily.     Yes  Historical Provider, MD  cholecalciferol (VITAMIN D) 1000 UNITS tablet Take 1,000 Units by mouth daily.   Yes Historical Provider, MD  FLUoxetine (PROZAC) 20 MG capsule Take 20 mg by mouth daily.     Yes Historical Provider, MD  folic acid (FOLVITE) 1 MG tablet Take 1 mg by mouth 2 (two) times daily.     Yes Historical Provider, MD  hydroxychloroquine (PLAQUENIL) 200 MG tablet Take 200 mg by mouth 2 (two) times daily.     Yes Historical Provider, MD  ibandronate (BONIVA) 3 MG/3ML SOLN injection Inject 3 mLs (3 mg total) into the vein once. 03/10/12  Yes Ezequiel Kayser, MD  Insulin Glargine (TOUJEO SOLOSTAR) 300 UNIT/ML SOPN Inject 8 Units into the skin daily. Before breakfast   Yes Historical Provider, MD  LORazepam (ATIVAN) 0.5 MG tablet Take 0.5 mg by mouth Nightly.     Yes Historical Provider, MD  NITROSTAT 0.4 MG SL tablet Place 0.4 mg under the tongue every 5 (five) minutes as needed for chest pain.  10/20/13  Yes Historical Provider, MD  pantoprazole (PROTONIX) 20 MG tablet Take 40 mg by mouth daily.    Yes Historical Provider, MD  predniSONE (DELTASONE) 1 MG tablet Take 1.5 mg by mouth daily with breakfast. Take with prednisone 5 mg   Yes Historical Provider, MD  predniSONE (DELTASONE) 5 MG tablet Take 5 mg by mouth  daily. Take with prednisone 1 mg   Yes Historical Provider, MD  promethazine (PHENERGAN) 12.5 MG tablet Take 12.5 mg by mouth every 6 (six) hours as needed for nausea or vomiting.    Yes Historical Provider, MD  ranitidine (ZANTAC) 150 MG capsule Take 150 mg by mouth 2 (two) times daily.     Yes Historical Provider, MD  sitaGLIPtin (JANUVIA) 50 MG tablet Take 100 mg by mouth daily.    Yes Historical Provider, MD   BP 165/84  Pulse 64  Temp(Src) 97.5 F (36.4 C) (Oral)  Resp 14  Ht 5\' 5"  (1.651 m)  Wt 137 lb (62.143 kg)  BMI 22.80 kg/m2  SpO2 94% Physical Exam  Nursing note and vitals reviewed. Constitutional: She is oriented to person, place, and time. She appears  well-developed and well-nourished. She appears distressed.  HENT:  Head: Normocephalic and atraumatic.  Nose: Nose normal.  Mouth/Throat: Oropharynx is clear and moist.  Eyes: Conjunctivae and EOM are normal. Pupils are equal, round, and reactive to light.  Neck: Normal range of motion. Neck supple. No JVD present. No tracheal deviation present. No thyromegaly present.  Cardiovascular: Normal rate, regular rhythm, normal heart sounds and intact distal pulses.  Exam reveals no gallop and no friction rub.   No murmur heard. Pulmonary/Chest: Effort normal and breath sounds normal. No stridor. No respiratory distress. She has no wheezes. She has no rales. She exhibits no tenderness.  Abdominal: Soft. Bowel sounds are normal. She exhibits no distension and no mass. There is no tenderness. There is no rebound and no guarding.  Pain is reported at epigastrium, spreads across upper abdomen.  No pain with palpation  Musculoskeletal: Normal range of motion. She exhibits no edema and no tenderness.  Lymphadenopathy:    She has no cervical adenopathy.  Neurological: She is alert and oriented to person, place, and time. She displays normal reflexes. She exhibits normal muscle tone. Coordination normal.  Skin: Skin is warm and dry. No rash noted. No erythema. No pallor.  Psychiatric: She has a normal mood and affect. Her behavior is normal. Judgment and thought content normal.    ED Course  Procedures (including critical care time) Labs Review Labs Reviewed  CBC WITH DIFFERENTIAL - Abnormal; Notable for the following:    WBC 12.2 (*)    Hemoglobin 11.8 (*)    Platelets 442 (*)    Neutro Abs 8.0 (*)    Monocytes Relative 13 (*)    Monocytes Absolute 1.5 (*)    All other components within normal limits  LIPASE, BLOOD - Abnormal; Notable for the following:    Lipase 67 (*)    All other components within normal limits  COMPREHENSIVE METABOLIC PANEL - Abnormal; Notable for the following:    Glucose,  Bld 129 (*)    Alkaline Phosphatase 37 (*)    GFR calc non Af Amer 62 (*)    GFR calc Af Amer 72 (*)    All other components within normal limits  I-STAT TROPOININ, ED    Imaging Review Ct Abdomen Pelvis W Contrast  07/07/2014   CLINICAL DATA:  Severe epigastric abdominal pain and burning. Acute onset of symptoms. Initial encounter.  EXAM: CT ABDOMEN AND PELVIS WITH CONTRAST  TECHNIQUE: Multidetector CT imaging of the abdomen and pelvis was performed using the standard protocol following bolus administration of intravenous contrast.  CONTRAST:  36mL OMNIPAQUE IOHEXOL 300 MG/ML  SOLN  COMPARISON:  CT of the abdomen and pelvis from 11/01/2010 abdominal ultrasound  performed 07/02/2011  FINDINGS: The visualized lung bases are clear. A large paraesophageal hernia contains nearly the entirety of the stomach.  A few nonspecific hypodensities are noted within the left hepatic lobe. These appear grossly stable and likely reflect small hepatic cysts. The spleen is unremarkable in appearance. The gallbladder is within normal limits. The pancreas and adrenal glands are unremarkable.  The kidneys are unremarkable in appearance. There is no evidence of hydronephrosis. No renal or ureteral stones are seen. No perinephric stranding is appreciated.  No free fluid is identified. The small bowel is unremarkable in appearance. No acute vascular abnormalities are seen. Mild scattered calcification is noted along the abdominal aorta and branches, including at the origins of the celiac trunk and superior mesenteric artery, and along the proximal right renal artery.  The appendix is not visualized. The colon is partially filled with stool. Mild scattered diverticulosis is noted along the sigmoid colon, without evidence of diverticulitis.  The bladder is mildly distended and grossly unremarkable. The uterus is unremarkable in appearance. The ovaries are relatively symmetric. No suspicious adnexal masses are seen. No inguinal  lymphadenopathy is seen.  No acute osseous abnormalities are identified. Multilevel vacuum phenomenon is noted along the lumbar spine. There is slight apparently chronic loss of height at the superior endplate of L3, new from 2012.  IMPRESSION: 1. Large paraesophageal hernia is grossly unchanged from 2012, containing nearly the entirety of the stomach. No evidence of obstruction. 2. Mild scattered calcification along the abdominal aorta and its branches, including at the origins of the celiac trunk and superior mesenteric artery, and along the proximal right renal artery. 3. Mild scattered diverticulosis along the sigmoid colon, without evidence of diverticulitis. 4. Few nonspecific hypodensities within the left hepatic lobe are grossly stable and likely reflect small hepatic cysts. 5. Slight apparently chronic loss of height at the superior endplate of L3, new from 2012.   Electronically Signed   By: Roanna Raider M.D.   On: 07/07/2014 06:37     EKG Interpretation None      MDM   Final diagnoses:  Epigastric pain  Hiatal hernia   80 yo female with upper abdominal pain.  Plan for low dose morphine, bentyl, labs and CT abd pelvis.     7:52 AM Pt now pain free.  Reports she had dysuria earlier in the month, negative ua at [facility].  Large hiatal hernia noted.  On zantac, will add on carafate.   Olivia Mackie, MD 07/07/14 [phone]

## 2014-07-07 NOTE — ED Notes (Signed)
Pt currently in CT.

## 2014-07-07 NOTE — ED Notes (Signed)
Pt arrives via EMS from home. Pt c/o [place] Upper Quadrant pain that she decribes as a burning sensation. Pt denies any radiation, nausea, vomiting, and diarrhea. Denies rectal bleeding. Tender to palpation and guarding. Pt states she is allergic to all pain meds but morphine. 18G LFA. 58/92 84.

## 2014-07-07 NOTE — ED Provider Notes (Signed)
Medical screening examination/treatment/procedure(s) were performed by non-physician practitioner and as supervising physician I was immediately available for consultation/collaboration.   EKG Interpretation None      Vital signs normal    Ward Givens, MD 07/07/14 [phone]

## 2014-07-07 NOTE — ED Provider Notes (Signed)
Pt was seen by Dr. Norlene Campbell earlier this morning for abd pain, fibromyalgias.  Upon discharge, the nurse remove her IV, but it has infiltrates causing a moderate size hematoma to R forearm.  Pt c/o significant pain to IV site.  She is NVI.  Will apply ice pack, pain medication given and will monitor.  We appologize to patient, and also discuss the expected course of hematoma.  Pt voice understanding.    Fayrene Helper, PA-C 07/07/14 [phone]

## 2014-08-03 ENCOUNTER — Emergency Department (HOSPITAL_COMMUNITY): Payer: Medicare Other

## 2014-08-03 ENCOUNTER — Encounter (HOSPITAL_COMMUNITY): Payer: Self-pay | Admitting: Emergency Medicine

## 2014-08-03 ENCOUNTER — Inpatient Hospital Stay (HOSPITAL_COMMUNITY)
Admission: EM | Admit: 2014-08-03 | Discharge: 2014-08-29 | DRG: 207 | Disposition: E | Payer: Medicare Other | Attending: Pulmonary Disease | Admitting: Pulmonary Disease

## 2014-08-03 DIAGNOSIS — G8929 Other chronic pain: Secondary | ICD-10-CM | POA: Diagnosis present

## 2014-08-03 DIAGNOSIS — J90 Pleural effusion, not elsewhere classified: Secondary | ICD-10-CM | POA: Diagnosis present

## 2014-08-03 DIAGNOSIS — S01111A Laceration without foreign body of right eyelid and periocular area, initial encounter: Secondary | ICD-10-CM | POA: Diagnosis not present

## 2014-08-03 DIAGNOSIS — N170 Acute kidney failure with tubular necrosis: Secondary | ICD-10-CM | POA: Diagnosis not present

## 2014-08-03 DIAGNOSIS — E875 Hyperkalemia: Secondary | ICD-10-CM | POA: Diagnosis present

## 2014-08-03 DIAGNOSIS — W010XXA Fall on same level from slipping, tripping and stumbling without subsequent striking against object, initial encounter: Secondary | ICD-10-CM | POA: Diagnosis present

## 2014-08-03 DIAGNOSIS — J189 Pneumonia, unspecified organism: Secondary | ICD-10-CM

## 2014-08-03 DIAGNOSIS — E860 Dehydration: Secondary | ICD-10-CM | POA: Diagnosis present

## 2014-08-03 DIAGNOSIS — E874 Mixed disorder of acid-base balance: Secondary | ICD-10-CM | POA: Diagnosis present

## 2014-08-03 DIAGNOSIS — M810 Age-related osteoporosis without current pathological fracture: Secondary | ICD-10-CM | POA: Diagnosis present

## 2014-08-03 DIAGNOSIS — M797 Fibromyalgia: Secondary | ICD-10-CM | POA: Diagnosis present

## 2014-08-03 DIAGNOSIS — E871 Hypo-osmolality and hyponatremia: Secondary | ICD-10-CM | POA: Diagnosis present

## 2014-08-03 DIAGNOSIS — Z7952 Long term (current) use of systemic steroids: Secondary | ICD-10-CM

## 2014-08-03 DIAGNOSIS — R6521 Severe sepsis with septic shock: Secondary | ICD-10-CM | POA: Diagnosis not present

## 2014-08-03 DIAGNOSIS — A419 Sepsis, unspecified organism: Secondary | ICD-10-CM | POA: Diagnosis not present

## 2014-08-03 DIAGNOSIS — F329 Major depressive disorder, single episode, unspecified: Secondary | ICD-10-CM | POA: Diagnosis present

## 2014-08-03 DIAGNOSIS — J690 Pneumonitis due to inhalation of food and vomit: Secondary | ICD-10-CM | POA: Diagnosis present

## 2014-08-03 DIAGNOSIS — E872 Acidosis: Secondary | ICD-10-CM | POA: Diagnosis present

## 2014-08-03 DIAGNOSIS — R05 Cough: Secondary | ICD-10-CM

## 2014-08-03 DIAGNOSIS — M940 Chondrocostal junction syndrome [Tietze]: Secondary | ICD-10-CM | POA: Diagnosis present

## 2014-08-03 DIAGNOSIS — R001 Bradycardia, unspecified: Secondary | ICD-10-CM | POA: Diagnosis not present

## 2014-08-03 DIAGNOSIS — K219 Gastro-esophageal reflux disease without esophagitis: Secondary | ICD-10-CM | POA: Diagnosis present

## 2014-08-03 DIAGNOSIS — Z66 Do not resuscitate: Secondary | ICD-10-CM | POA: Diagnosis present

## 2014-08-03 DIAGNOSIS — G931 Anoxic brain damage, not elsewhere classified: Secondary | ICD-10-CM | POA: Diagnosis not present

## 2014-08-03 DIAGNOSIS — E1165 Type 2 diabetes mellitus with hyperglycemia: Secondary | ICD-10-CM | POA: Diagnosis present

## 2014-08-03 DIAGNOSIS — R0902 Hypoxemia: Secondary | ICD-10-CM

## 2014-08-03 DIAGNOSIS — E46 Unspecified protein-calorie malnutrition: Secondary | ICD-10-CM | POA: Diagnosis present

## 2014-08-03 DIAGNOSIS — R059 Cough, unspecified: Secondary | ICD-10-CM

## 2014-08-03 DIAGNOSIS — J9601 Acute respiratory failure with hypoxia: Secondary | ICD-10-CM | POA: Insufficient documentation

## 2014-08-03 DIAGNOSIS — K449 Diaphragmatic hernia without obstruction or gangrene: Secondary | ICD-10-CM | POA: Diagnosis present

## 2014-08-03 DIAGNOSIS — W19XXXA Unspecified fall, initial encounter: Secondary | ICD-10-CM

## 2014-08-03 DIAGNOSIS — R1310 Dysphagia, unspecified: Secondary | ICD-10-CM

## 2014-08-03 DIAGNOSIS — Y92230 Patient room in hospital as the place of occurrence of the external cause: Secondary | ICD-10-CM | POA: Diagnosis not present

## 2014-08-03 DIAGNOSIS — D649 Anemia, unspecified: Secondary | ICD-10-CM | POA: Diagnosis present

## 2014-08-03 DIAGNOSIS — E119 Type 2 diabetes mellitus without complications: Secondary | ICD-10-CM

## 2014-08-03 DIAGNOSIS — N179 Acute kidney failure, unspecified: Secondary | ICD-10-CM | POA: Insufficient documentation

## 2014-08-03 DIAGNOSIS — Z6828 Body mass index (BMI) 28.0-28.9, adult: Secondary | ICD-10-CM | POA: Diagnosis not present

## 2014-08-03 DIAGNOSIS — Z0189 Encounter for other specified special examinations: Secondary | ICD-10-CM

## 2014-08-03 DIAGNOSIS — H5704 Mydriasis: Secondary | ICD-10-CM

## 2014-08-03 DIAGNOSIS — J9600 Acute respiratory failure, unspecified whether with hypoxia or hypercapnia: Secondary | ICD-10-CM

## 2014-08-03 DIAGNOSIS — G935 Compression of brain: Secondary | ICD-10-CM | POA: Diagnosis not present

## 2014-08-03 DIAGNOSIS — J80 Acute respiratory distress syndrome: Secondary | ICD-10-CM

## 2014-08-03 DIAGNOSIS — S0181XA Laceration without foreign body of other part of head, initial encounter: Secondary | ICD-10-CM | POA: Diagnosis not present

## 2014-08-03 DIAGNOSIS — R079 Chest pain, unspecified: Secondary | ICD-10-CM

## 2014-08-03 DIAGNOSIS — J982 Interstitial emphysema: Secondary | ICD-10-CM | POA: Diagnosis not present

## 2014-08-03 DIAGNOSIS — M069 Rheumatoid arthritis, unspecified: Secondary | ICD-10-CM | POA: Diagnosis present

## 2014-08-03 DIAGNOSIS — R918 Other nonspecific abnormal finding of lung field: Secondary | ICD-10-CM

## 2014-08-03 DIAGNOSIS — R0602 Shortness of breath: Secondary | ICD-10-CM

## 2014-08-03 DIAGNOSIS — IMO0001 Reserved for inherently not codable concepts without codable children: Secondary | ICD-10-CM | POA: Diagnosis present

## 2014-08-03 DIAGNOSIS — Z4659 Encounter for fitting and adjustment of other gastrointestinal appliance and device: Secondary | ICD-10-CM

## 2014-08-03 LAB — CBC WITH DIFFERENTIAL/PLATELET
BASOS PCT: 0 % (ref 0–1)
Basophils Absolute: 0 10*3/uL (ref 0.0–0.1)
Eosinophils Absolute: 0.1 10*3/uL (ref 0.0–0.7)
Eosinophils Relative: 0 % (ref 0–5)
HCT: 31.7 % — ABNORMAL LOW (ref 36.0–46.0)
Hemoglobin: 10.2 g/dL — ABNORMAL LOW (ref 12.0–15.0)
Lymphocytes Relative: 6 % — ABNORMAL LOW (ref 12–46)
Lymphs Abs: 0.8 10*3/uL (ref 0.7–4.0)
MCH: 25.6 pg — ABNORMAL LOW (ref 26.0–34.0)
MCHC: 32.2 g/dL (ref 30.0–36.0)
MCV: 79.4 fL (ref 78.0–100.0)
MONOS PCT: 6 % (ref 3–12)
Monocytes Absolute: 0.9 10*3/uL (ref 0.1–1.0)
NEUTROS ABS: 13.5 10*3/uL — AB (ref 1.7–7.7)
NEUTROS PCT: 88 % — AB (ref 43–77)
PLATELETS: 525 10*3/uL — AB (ref 150–400)
RBC: 3.99 MIL/uL (ref 3.87–5.11)
RDW: 13.9 % (ref 11.5–15.5)
WBC: 15.3 10*3/uL — ABNORMAL HIGH (ref 4.0–10.5)

## 2014-08-03 LAB — URINALYSIS, ROUTINE W REFLEX MICROSCOPIC
BILIRUBIN URINE: NEGATIVE
Glucose, UA: NEGATIVE mg/dL
Hgb urine dipstick: NEGATIVE
Ketones, ur: NEGATIVE mg/dL
Leukocytes, UA: NEGATIVE
Nitrite: NEGATIVE
PROTEIN: NEGATIVE mg/dL
SPECIFIC GRAVITY, URINE: 1.002 — AB (ref 1.005–1.030)
UROBILINOGEN UA: 1 mg/dL (ref 0.0–1.0)
pH: 7 (ref 5.0–8.0)

## 2014-08-03 LAB — BASIC METABOLIC PANEL
Anion gap: 13 (ref 5–15)
BUN: 9 mg/dL (ref 6–23)
CHLORIDE: 91 meq/L — AB (ref 96–112)
CO2: 20 mEq/L (ref 19–32)
Calcium: 8.3 mg/dL — ABNORMAL LOW (ref 8.4–10.5)
Creatinine, Ser: 0.67 mg/dL (ref 0.50–1.10)
GFR, EST NON AFRICAN AMERICAN: 82 mL/min — AB (ref >90)
Glucose, Bld: 177 mg/dL — ABNORMAL HIGH (ref 70–99)
POTASSIUM: 4.2 meq/L (ref 3.7–5.3)
SODIUM: 124 meq/L — AB (ref 137–147)

## 2014-08-03 LAB — I-STAT CG4 LACTIC ACID, ED: Lactic Acid, Venous: 1.31 mmol/L (ref 0.5–2.2)

## 2014-08-03 LAB — I-STAT TROPONIN, ED: TROPONIN I, POC: 0 ng/mL (ref 0.00–0.08)

## 2014-08-03 LAB — TROPONIN I: Troponin I: <0.3 ng/mL (ref <0.30)

## 2014-08-03 LAB — GLUCOSE, CAPILLARY: GLUCOSE-CAPILLARY: 189 mg/dL — AB (ref 70–99)

## 2014-08-03 MED ORDER — INSULIN ASPART 100 UNIT/ML ~~LOC~~ SOLN
0.0000 [IU] | Freq: Three times a day (TID) | SUBCUTANEOUS | Status: DC
  Administered 2014-08-03: 3 [IU] via SUBCUTANEOUS
  Administered 2014-08-04: 2 [IU] via SUBCUTANEOUS
  Administered 2014-08-04: 5 [IU] via SUBCUTANEOUS
  Administered 2014-08-04: 8 [IU] via SUBCUTANEOUS
  Administered 2014-08-05: 3 [IU] via SUBCUTANEOUS
  Administered 2014-08-05: 8 [IU] via SUBCUTANEOUS
  Administered 2014-08-05 – 2014-08-06 (×2): 3 [IU] via SUBCUTANEOUS
  Administered 2014-08-06: 2 [IU] via SUBCUTANEOUS
  Administered 2014-08-07 (×2): 3 [IU] via SUBCUTANEOUS
  Administered 2014-08-07: 5 [IU] via SUBCUTANEOUS

## 2014-08-03 MED ORDER — CALCIUM CARBONATE-VITAMIN D 500-200 MG-UNIT PO TABS
1.0000 | ORAL_TABLET | Freq: Every day | ORAL | Status: DC
  Administered 2014-08-03 – 2014-08-11 (×8): 1 via ORAL
  Filled 2014-08-03 (×8): qty 1

## 2014-08-03 MED ORDER — FAMOTIDINE 20 MG PO TABS
20.0000 mg | ORAL_TABLET | Freq: Two times a day (BID) | ORAL | Status: DC
Start: 2014-08-03 — End: 2014-08-05
  Administered 2014-08-03 – 2014-08-05 (×4): 20 mg via ORAL
  Filled 2014-08-03 (×6): qty 1

## 2014-08-03 MED ORDER — HYDROXYCHLOROQUINE SULFATE 200 MG PO TABS
200.0000 mg | ORAL_TABLET | Freq: Two times a day (BID) | ORAL | Status: DC
  Administered 2014-08-03 – 2014-08-10 (×11): 200 mg via ORAL
  Filled 2014-08-03 (×17): qty 1

## 2014-08-03 MED ORDER — DEXTROSE 5 % IV SOLN: 1.0000 g | Freq: Once | INTRAVENOUS | Status: DC

## 2014-08-03 MED ORDER — MORPHINE SULFATE 20 MG/5ML PO SOLN: 5.0000 mg | ORAL | Status: DC | PRN

## 2014-08-03 MED ORDER — DEXTROSE 5 % IV SOLN
500.0000 mg | Freq: Once | INTRAVENOUS | Status: AC
  Administered 2014-08-03: 500 mg via INTRAVENOUS
  Filled 2014-08-03: qty 500

## 2014-08-03 MED ORDER — HEPARIN SODIUM (PORCINE) 5000 UNIT/ML IJ SOLN
5000.0000 [IU] | Freq: Three times a day (TID) | INTRAMUSCULAR | Status: DC
  Administered 2014-08-03 – 2014-08-09 (×15): 5000 [IU] via SUBCUTANEOUS
  Filled 2014-08-03 (×18): qty 1

## 2014-08-03 MED ORDER — SUCRALFATE 1 G PO TABS: 1.0000 g | ORAL_TABLET | Freq: Four times a day (QID) | ORAL | Status: DC

## 2014-08-03 MED ORDER — ONDANSETRON HCL 4 MG/2ML IJ SOLN: 4.0000 mg | Freq: Three times a day (TID) | INTRAMUSCULAR | Status: DC | PRN

## 2014-08-03 MED ORDER — FOLIC ACID 1 MG PO TABS
1.0000 mg | ORAL_TABLET | Freq: Two times a day (BID) | ORAL | Status: DC
  Administered 2014-08-03 – 2014-08-11 (×13): 1 mg via ORAL
  Filled 2014-08-03 (×14): qty 1

## 2014-08-03 MED ORDER — SODIUM CHLORIDE 0.9 % IV SOLN
INTRAVENOUS | Status: DC
  Administered 2014-08-03: 15:00:00 via INTRAVENOUS

## 2014-08-03 MED ORDER — PANTOPRAZOLE SODIUM 40 MG PO TBEC
40.0000 mg | DELAYED_RELEASE_TABLET | Freq: Every day | ORAL | Status: DC
  Administered 2014-08-04 – 2014-08-05 (×2): 40 mg via ORAL
  Filled 2014-08-03 (×2): qty 1

## 2014-08-03 MED ORDER — INSULIN GLARGINE 100 UNIT/ML ~~LOC~~ SOLN
8.0000 [IU] | Freq: Every day | SUBCUTANEOUS | Status: DC
  Administered 2014-08-04 – 2014-08-07 (×4): 8 [IU] via SUBCUTANEOUS
  Filled 2014-08-03 (×5): qty 0.08

## 2014-08-03 MED ORDER — ONDANSETRON HCL 4 MG PO TABS
4.0000 mg | ORAL_TABLET | Freq: Four times a day (QID) | ORAL | Status: DC | PRN
Start: 2014-08-03 — End: 2014-08-12

## 2014-08-03 MED ORDER — PREDNISONE 20 MG PO TABS
20.0000 mg | ORAL_TABLET | Freq: Every day | ORAL | Status: DC
  Administered 2014-08-04 – 2014-08-06 (×3): 20 mg via ORAL
  Filled 2014-08-03 (×4): qty 1

## 2014-08-03 MED ORDER — ALBUTEROL SULFATE (2.5 MG/3ML) 0.083% IN NEBU
2.5000 mg | INHALATION_SOLUTION | RESPIRATORY_TRACT | Status: DC | PRN
  Administered 2014-08-06: 2.5 mg via RESPIRATORY_TRACT
  Filled 2014-08-03: qty 3

## 2014-08-03 MED ORDER — SODIUM CHLORIDE 0.9 % IJ SOLN
3.0000 mL | Freq: Two times a day (BID) | INTRAMUSCULAR | Status: DC
  Administered 2014-08-04 – 2014-08-11 (×14): 3 mL via INTRAVENOUS

## 2014-08-03 MED ORDER — DEXTROSE 5 % IV SOLN
1.0000 g | INTRAVENOUS | Status: DC
  Administered 2014-08-03 – 2014-08-06 (×4): 1 g via INTRAVENOUS
  Filled 2014-08-03 (×5): qty 10

## 2014-08-03 MED ORDER — ONDANSETRON HCL 4 MG/2ML IJ SOLN
4.0000 mg | Freq: Four times a day (QID) | INTRAMUSCULAR | Status: DC | PRN
  Administered 2014-08-03 – 2014-08-06 (×4): 4 mg via INTRAVENOUS
  Filled 2014-08-03 (×7): qty 2

## 2014-08-03 MED ORDER — AZITHROMYCIN 500 MG IV SOLR
500.0000 mg | INTRAVENOUS | Status: DC
  Administered 2014-08-04 – 2014-08-06 (×3): 500 mg via INTRAVENOUS
  Filled 2014-08-03 (×4): qty 500

## 2014-08-03 MED ORDER — FLUOXETINE HCL 20 MG PO CAPS
20.0000 mg | ORAL_CAPSULE | Freq: Every day | ORAL | Status: DC
  Administered 2014-08-04 – 2014-08-07 (×4): 20 mg via ORAL
  Filled 2014-08-03 (×5): qty 1

## 2014-08-03 MED ORDER — SODIUM CHLORIDE 0.9 % IV SOLN
INTRAVENOUS | Status: AC
  Administered 2014-08-04: 06:00:00 via INTRAVENOUS

## 2014-08-03 MED ORDER — MORPHINE SULFATE (CONCENTRATE) 10 MG/0.5ML PO SOLN
5.0000 mg | ORAL | Status: DC | PRN
  Administered 2014-08-03: 5 mg via ORAL
  Administered 2014-08-04 – 2014-08-06 (×9): 10 mg via ORAL
  Filled 2014-08-03 (×12): qty 0.5

## 2014-08-03 MED ORDER — LORAZEPAM 0.5 MG PO TABS
0.5000 mg | ORAL_TABLET | Freq: Every day | ORAL | Status: DC
  Administered 2014-08-03 – 2014-08-05 (×3): 0.5 mg via ORAL
  Filled 2014-08-03 (×3): qty 1

## 2014-08-03 NOTE — ED Notes (Signed)
Pt had just went to restroom before UA was ordered

## 2014-08-03 NOTE — ED Notes (Signed)
Attempted to call report again Floor RN not available

## 2014-08-03 NOTE — ED Notes (Signed)
Called [address] to give report Staff unaware that patient was assigned Call back number to ED provided Awaiting return call

## 2014-08-03 NOTE — ED Notes (Signed)
Admitting MD at bedside.

## 2014-08-03 NOTE — ED Provider Notes (Signed)
CSN: [id number]     Arrival date & time 2014/08/20  1242 History   First MD Initiated Contact with Patient 08/20/14 1253     Chief Complaint  Patient presents with  . Pneumonia     (Consider location/radiation/quality/duration/timing/severity/associated sxs/prior Treatment) The history is provided by the patient and medical records.    This is a 80 y.o. F with PMH significant for prednisone induced DM, RA, fibromyalgia, presenting to the ED for pneumonia.  Patient states she was seen by her PCP on Monday, had CXR performed and diagnosed with pneumonia.  States she was started on z-pack and prednisone, has taken 3 days worth of medication thus far however states she is not feeling any better.  States she continues having minimally productive cough, subjective fever and chills, and overall feeling unwell.  Also notes some nausea but denies vomiting.  No abdominal pain or diarrhea.  Husband has been making her small meals which she has been able to tolerate.  States some chest discomfort and intermittent SOB, mostly with coughing.  No prior cardiac hx.  Patient has never been a smoker.  VS stable on arrival.    Past Medical History  Diagnosis Date  . Diabetes mellitus     "prednisone induced" per patient  . Osteoporosis   . Rheumatoid arthritis(714.0)   . Fibromyalgia    Past Surgical History  Procedure Laterality Date  . Tonsillectomy    . "womb suspension" and appendectomy      age 63  . Dilation and curettage of uterus    . Cystoscopy      evaluating UTI   Family History  Problem Relation Age of Onset  . Heart disease Mother    History  Substance Use Topics  . Smoking status: Never Smoker   . Smokeless tobacco: Not on file  . Alcohol Use: No   OB History    No data available     Review of Systems  Respiratory: Positive for cough and shortness of breath.   Cardiovascular: Positive for chest pain.  Gastrointestinal: Positive for nausea.  All other systems reviewed and are  negative.     Allergies  Adalimumab; Codeine; Doxycycline; Epinephrine; Gabapentin; Infliximab; Meperidine hcl; Methocarbamol; Methotrexate; Metronidazole; Penicillins; Pentazocine lactate; Risedronate sodium; Sulfonamide derivatives; Tetracycline; and Tramadol  Home Medications   Prior to Admission medications   Medication Sig Start Date End Date Taking? Authorizing Provider  calcium-vitamin D (OSCAL WITH D) 500-200 MG-UNIT per tablet Take 1 tablet by mouth daily.      Historical Provider, MD  cholecalciferol (VITAMIN D) 1000 UNITS tablet Take 1,000 Units by mouth daily.    Historical Provider, MD  FLUoxetine (PROZAC) 20 MG capsule Take 20 mg by mouth daily.      Historical Provider, MD  folic acid (FOLVITE) 1 MG tablet Take 1 mg by mouth 2 (two) times daily.      Historical Provider, MD  hydroxychloroquine (PLAQUENIL) 200 MG tablet Take 200 mg by mouth 2 (two) times daily.      Historical Provider, MD  ibandronate (BONIVA) 3 MG/3ML SOLN injection Inject 3 mLs (3 mg total) into the vein once. 03/10/12   Ezequiel Kayser, MD  Insulin Glargine (TOUJEO SOLOSTAR) 300 UNIT/ML SOPN Inject 8 Units into the skin daily. Before breakfast    Historical Provider, MD  LORazepam (ATIVAN) 0.5 MG tablet Take 0.5 mg by mouth Nightly.      Historical Provider, MD  NITROSTAT 0.4 MG SL tablet Place 0.4 mg under the  tongue every 5 (five) minutes as needed for chest pain.  10/20/13   Historical Provider, MD  pantoprazole (PROTONIX) 20 MG tablet Take 40 mg by mouth daily.     Historical Provider, MD  predniSONE (DELTASONE) 1 MG tablet Take 1.5 mg by mouth daily with breakfast. Take with prednisone 5 mg    Historical Provider, MD  predniSONE (DELTASONE) 5 MG tablet Take 5 mg by mouth daily. Take with prednisone 1 mg    Historical Provider, MD  promethazine (PHENERGAN) 12.5 MG tablet Take 12.5 mg by mouth every 6 (six) hours as needed for nausea or vomiting.     Historical Provider, MD  ranitidine (ZANTAC) 150 MG  capsule Take 150 mg by mouth 2 (two) times daily.      Historical Provider, MD  sitaGLIPtin (JANUVIA) 50 MG tablet Take 100 mg by mouth daily.     Historical Provider, MD  sucralfate (CARAFATE) 1 G tablet Take 1 tablet (1 g total) by mouth 4 (four) times daily. 07/07/14   Olivia Mackie, MD   BP 125/66 mmHg  Pulse 93  Temp(Src) 97.4 F (36.3 C) (Oral)  Resp 18  SpO2 98%   Physical Exam  Constitutional: She is oriented to person, place, and time. She appears well-developed and well-nourished. No distress.  HENT:  Head: Normocephalic and atraumatic.  Right Ear: Tympanic membrane and ear canal normal.  Left Ear: Tympanic membrane and ear canal normal.  Nose: Nose normal.  Mouth/Throat: Uvula is midline and oropharynx is clear and moist. Mucous membranes are dry. No oropharyngeal exudate, posterior oropharyngeal edema, posterior oropharyngeal erythema or tonsillar abscesses.  Dry mucous membranes  Eyes: Conjunctivae and EOM are normal. Pupils are equal, round, and reactive to light.  Neck: Normal range of motion. Neck supple.  Cardiovascular: Normal rate, regular rhythm and normal heart sounds.   Pulmonary/Chest: Effort normal. No respiratory distress. She has no wheezes. She has rhonchi. She has no rales.  No distress noted; scattered rhonchi; able to speak in full sentences without difficulty  Abdominal: Soft. Bowel sounds are normal. There is no tenderness. There is no guarding.  Musculoskeletal: Normal range of motion.  Neurological: She is alert and oriented to person, place, and time.  Skin: Skin is warm and dry. She is not diaphoretic.  Psychiatric: She has a normal mood and affect.  Nursing note and vitals reviewed.   ED Course  Procedures (including critical care time) Labs Review Labs Reviewed  CBC WITH DIFFERENTIAL - Abnormal; Notable for the following:    WBC 15.3 (*)    Hemoglobin 10.2 (*)    HCT 31.7 (*)    MCH 25.6 (*)    Platelets 525 (*)    Neutrophils Relative %  88 (*)    Neutro Abs 13.5 (*)    Lymphocytes Relative 6 (*)    All other components within normal limits  BASIC METABOLIC PANEL - Abnormal; Notable for the following:    Sodium 124 (*)    Chloride 91 (*)    Glucose, Bld 177 (*)    Calcium 8.3 (*)    GFR calc non Af Amer 82 (*)    All other components within normal limits  URINALYSIS, ROUTINE W REFLEX MICROSCOPIC  I-STAT CG4 LACTIC ACID, ED  Rosezena Sensor, ED    Imaging Review Dg Chest 2 View  Aug 05, 2014   CLINICAL DATA:  Cough, weakness shortness of breath.  Pneumonia.  EXAM: CHEST  2 VIEW  COMPARISON:  Previous examinations, the most recent  dated 07/02/2011.  FINDINGS: Normal sized heart. Moderate sized hiatal hernia. Interval patchy opacity in the superior segments of both lower lobes and possibly in the right upper lobes. Interval small nodular density at the left lung apex. , right upper lobe and right middle lobe. Suggestion minimal bilateral pleural fluid on 1 of the lateral views, not seen the other views. Mild thoracic and lumbar spine degenerative changes and old 10% lower lumbar spine superior endplate compression deformity.  IMPRESSION: 1. Bilateral multi lobar pneumonia. 2. Possible minimal bilateral pleural. 3. Moderate-sized hiatal hernia. 4. Possible small left apical lung nodule. Attention to this area on follow-up chest radiographs is recommended.   Electronically Signed   By: Gordan Payment M.D.   On: 08/08/2014 13:54     EKG Interpretation None      MDM   Final diagnoses:  SOB (shortness of breath)  CAP (community acquired pneumonia)  Cough   80 y.o. F with dx of CAP on Monday, now with worsening sx.  On exam, patient afebrile but appears unwell.  Lungs with scattered rhonchi, no distress noted and VS currently stable on RA.  Will obtain lab work, ekg, trop, and repeat CXR.    EKG sinus rhythm without ischemic change. Troponin negative.  Lab work with leukocytosis, hyponatremia, and hypochloremia, however anion  gap remains normal-- possibly due current to prednisone use (patient has hx of induced DM while taking prednisone).  Chest x-ray with bilateral multilobar pneumonia. After ambulating to the restroom, patient had O2 sats of 91%. Feel patient would benefit from hospitalization and IV antibiotics. Consider changing abx to Levaquin given that she has failed outpatient treatment with azithromycin, however patient has had tendon issues after taking fluoroquinolones in the past. Will start on IV azithromycin and Rocephin. Will admit to hospitalist service for further management.  Case discussed with Dr. Barnie Del who will admit to telemetry.  Garlon Hatchet, PA-C 08/01/2014 1451  Warnell Forester, MD 08/02/2014 [phone]

## 2014-08-03 NOTE — ED Notes (Signed)
Patient states that she has PNA Patient also wants to make staff aware that she has fibromyalgia Patient in NAD

## 2014-08-03 NOTE — ED Notes (Signed)
Per pt, states she had chest xray on Monday and it showed PNA-

## 2014-08-03 NOTE — H&P (Signed)
[facility] History and Physical  Patricia Mosley IRW:[id number] DOB: 1934/05/07 DOA: 08/04/2014   PCP: Jerlyn Ly, MD  Specialists: none  Chief Complaint: weakness with cough  HPI: Patricia Mosley is a 80 y.o. female with a past medical history of fibromyalgia, diabetes mellitus type 2, rheumatoid arthritis on chronic steroids who was in her usual state of health till last week when she started experiencing generalized weakness. She felt fatigued. She went to see her primary care doctor on Friday. No abnormality was found on examination at that visit and so her dose of prednisone was increased to 7.5 mg and she was asked to take it for 3 days. She took her medications and then decided to follow up with her PCP this past Monday. And at that time she was having a cough. A chest x-ray was obtained which revealed a pneumonia. She was started on a Z-Pak. However, patient hasn't shown any improvement. She continues to have a dry cough. She's had some shortness of breath as well along with chest pain, which she blames on her fibromyalgia. The pain is mainly with deep breathing. Patient is a very poor historian. She keeps getting distracted. She keeps talking about other issues at times. She was seen in the [facility] emergency department a few weeks ago for abdominal pain. She underwent a CT scan which showed a paraesophageal hernia. She was given Carafate and was discharged home. She denies any fever during these illnesses. Has been nauseated but denies any vomiting. Denies any syncopal episode. Denies recent hospitalization. Only has had ER visits.  Home Medications: Prior to Admission medications   Medication Sig Start Date End Date Taking? Authorizing Provider  albuterol (PROVENTIL HFA;VENTOLIN HFA) 108 (90 BASE) MCG/ACT inhaler Inhale 1 puff into the lungs every 6 (six) hours as needed for wheezing or shortness of breath.   Yes Historical Provider, MD  calcium-vitamin D (OSCAL WITH D) 500-200  MG-UNIT per tablet Take 1 tablet by mouth daily.     Yes Historical Provider, MD  cholecalciferol (VITAMIN D) 1000 UNITS tablet Take 1,000 Units by mouth daily.   Yes Historical Provider, MD  FLUoxetine (PROZAC) 20 MG capsule Take 20 mg by mouth daily.     Yes Historical Provider, MD  folic acid (FOLVITE) 1 MG tablet Take 1 mg by mouth 2 (two) times daily.     Yes Historical Provider, MD  hydroxychloroquine (PLAQUENIL) 200 MG tablet Take 200 mg by mouth 2 (two) times daily.     Yes Historical Provider, MD  ibandronate (BONIVA) 3 MG/3ML SOLN injection Inject 3 mLs (3 mg total) into the vein once. 03/10/12  Yes Jerlyn Ly, MD  Insulin Glargine (TOUJEO SOLOSTAR) 300 UNIT/ML SOPN Inject 8 Units into the skin daily. Before breakfast   Yes Historical Provider, MD  LORazepam (ATIVAN) 0.5 MG tablet Take 0.5 mg by mouth Nightly.     Yes Historical Provider, MD  morphine 20 MG/5ML solution Take 5-10 mg by mouth every 2 (two) hours as needed for pain.   Yes Historical Provider, MD  NITROSTAT 0.4 MG SL tablet Place 0.4 mg under the tongue every 5 (five) minutes as needed for chest pain.  10/20/13  Yes Historical Provider, MD  PANTOPRAZOLE SODIUM PO Take 1 tablet by mouth every morning.   Yes Historical Provider, MD  predniSONE (DELTASONE) 1 MG tablet Take 2 mg by mouth daily with breakfast. Take with prednisone 5 mg   Yes Historical Provider, MD  predniSONE (DELTASONE) 5 MG  tablet Take 5 mg by mouth daily. Take with prednisone 2 mg   Yes Historical Provider, MD  Probiotic Product (ALIGN PO) Take 1 capsule by mouth daily.   Yes Historical Provider, MD  promethazine (PHENERGAN) 12.5 MG tablet Take 12.5 mg by mouth every 6 (six) hours as needed for nausea or vomiting.    Yes Historical Provider, MD  ranitidine (ZANTAC) 150 MG capsule Take 150 mg by mouth 2 (two) times daily.     Yes Historical Provider, MD  sitaGLIPtin (JANUVIA) 50 MG tablet Take 100 mg by mouth daily.    Yes Historical Provider, MD  pantoprazole  (PROTONIX) 20 MG tablet Take 40 mg by mouth daily.     Historical Provider, MD  sucralfate (CARAFATE) 1 G tablet Take 1 tablet (1 g total) by mouth 4 (four) times daily. Patient not taking: Reported on 08/18/2014 07/07/14   Kalman Drape, MD    Allergies:  Allergies  Allergen Reactions  . Acetaminophen     Ineffective.   . Adalimumab     "Sinking spells."  . Allegra [Fexofenadine]     "Serum sickness."   . Allopurinol     "Serum sickness."   . Boniva [Ibandronic Acid]     Oral tablet causes chest pain. IV Boniva OK.    . Ciprofloxacin     "Pains in legs that may have been some mild tendonitis. She could maybe take it again."   . Codeine     Auditory and visual hallucinations.   . Dextromethorphan Nausea And Vomiting  . Enbrel [Etanercept]     "Sinking spells."  . Epinephrine     Unknown.    . Estrogens     "Felt sick."   . Forteo [Teriparatide (Recombinant)]     "Felt weird."   . Gabapentin     Unknown.    . Infliximab     Unknown.    [place] [Levofloxacin]     "Bilateral inflamed achilles tendons."   . Levemir [Insulin Detemir] Itching  . Meperidine Hcl     Unknown.   . Methocarbamol     Unknown.   . Methotrexate     Unknown.    . Other     "Eye dilating medicine." "Makes her 'goofy.'"   . Penicillins     Unknown.   Marland Kitchen Pentazocine Lactate     Unknown.    . Quinine Derivatives Diarrhea and Nausea And Vomiting  . Qvar [Beclomethasone]     "Made throat hurt worse and maybe made her GERD worse so stopped after 4 days."  . Risedronate Sodium     Tolerates IV boniva  . Robitussin (Alcohol Free) [Guaifenesin]     "Made sick."   . Sulfonamide Derivatives     Unknown.   . Tetracycline     Unknown.    . Tramadol Other (See Comments)    Hallucinations   . Doxycycline Diarrhea  . Metronidazole Rash  . Vitamin D2 [Ergocalciferol] Rash    GI upset.     Past Medical History: Past Medical History  Diagnosis Date  . Diabetes mellitus     "prednisone induced"  per patient  . Osteoporosis   . Rheumatoid arthritis(714.0)   . Fibromyalgia     Past Surgical History  Procedure Laterality Date  . Tonsillectomy    . "womb suspension" and appendectomy      age 55  . Dilation and curettage of uterus    . Cystoscopy      evaluating  UTI    Social History: she lives with her husband in [place]. No smoking, alcohol use or illicit drug use. Independent with daily activities. Usually.  Family History:  Family History  Problem Relation Age of Onset  . Heart disease Mother      Review of Systems - History obtained from the patient General ROS: positive for  - fatigue Psychological ROS: negative Ophthalmic ROS: negative ENT ROS: negative Allergy and Immunology ROS: negative Hematological and Lymphatic ROS: negative Endocrine ROS: negative Respiratory ROS: as in hpi Cardiovascular ROS: as in hpi Gastrointestinal ROS: as in hpi Genito-Urinary ROS: no dysuria, trouble voiding, or hematuria Musculoskeletal ROS: negative Neurological ROS: no TIA or stroke symptoms Dermatological ROS: negative  Physical Examination  Filed Vitals:   08/25/2014 1407 08/26/2014 1456 08/20/2014 1457 07/31/2014 1459  BP: 123/58 124/67    Pulse: 81 102 96 82  Temp:      TempSrc:      Resp: _0 SpO2: 91% 92% 83% 94%    BP 124/67 mmHg  Pulse 82  Temp(Src) 97.4 F (36.3 C) (Oral)  Resp 24  SpO2 94%  General appearance: alert, cooperative, appears older than stated age and no distress Head: Normocephalic, without obvious abnormality, atraumatic Eyes: conjunctivae/corneas clear. PERRL, EOM's intact.  Throat: dry Mucous membranes were noted Neck: no adenopathy, no carotid bruit, no JVD, supple, symmetrical, trachea midline and thyroid not enlarged, symmetric, no tenderness/mass/nodules Resp: ccrackles noted bilaterally. No rhonchi. No wheezing. Cardio: regular rate and rhythm, S1, S2 normal, no murmur, click, rub or gallop GI: abdomen soft. Diffuse vague  tenderness without any rebound rigidity or guarding. Bowel sounds are present. No masses, organomegaly. Extremities: extremities normal, atraumatic, no cyanosis or edema Pulses: 2+ and symmetric Skin: bruising is noted. this is chronic per patient. Lymph nodes: Cervical, supraclavicular, and axillary nodes normal. Neurologic: alert and oriented 3. Cranial nerves intact 2-12. Motor strength 5-5 bilateral upper and lower extremities.  Laboratory Data: Results for orders placed or performed during the hospital encounter of 08/18/2014 (from the past 48 hour(s))  CBC with Differential     Status: Abnormal   Collection Time: 07/30/2014  1:26 PM  Result Value Ref Range   WBC 15.3 (H) 4.0 - 10.5 K/uL   RBC 3.99 3.87 - 5.11 MIL/uL   Hemoglobin 10.2 (L) 12.0 - 15.0 g/dL   HCT 31.7 (L) 36.0 - 46.0 %   MCV 79.4 78.0 - 100.0 fL   MCH 25.6 (L) 26.0 - 34.0 pg   MCHC 32.2 30.0 - 36.0 g/dL   RDW 13.9 11.5 - 15.5 %   Platelets 525 (H) 150 - 400 K/uL   Neutrophils Relative % 88 (H) 43 - 77 %   Neutro Abs 13.5 (H) 1.7 - 7.7 K/uL   Lymphocytes Relative 6 (L) 12 - 46 %   Lymphs Abs 0.8 0.7 - 4.0 K/uL   Monocytes Relative 6 3 - 12 %   Monocytes Absolute 0.9 0.1 - 1.0 K/uL   Eosinophils Relative 0 0 - 5 %   Eosinophils Absolute 0.1 0.0 - 0.7 K/uL   Basophils Relative 0 0 - 1 %   Basophils Absolute 0.0 0.0 - 0.1 K/uL  Basic metabolic panel     Status: Abnormal   Collection Time: 08/17/2014  1:26 PM  Result Value Ref Range   Sodium 124 (L) 137 - 147 mEq/L   Potassium 4.2 3.7 - 5.3 mEq/L   Chloride 91 (L) 96 - 112 mEq/L  CO2 20 19 - 32 mEq/L   Glucose, Bld 177 (H) 70 - 99 mg/dL   BUN 9 6 - 23 mg/dL   Creatinine, Ser 0.67 0.50 - 1.10 mg/dL   Calcium 8.3 (L) 8.4 - 10.5 mg/dL   GFR calc non Af Amer 82 (L) >90 mL/min   GFR calc Af Amer >90 >90 mL/min    Comment: (NOTE) The eGFR has been calculated using the CKD EPI equation. This calculation has not been validated in all clinical situations. eGFR's  persistently <90 mL/min signify possible Chronic Kidney Disease.    Anion gap 13 5 - 15  I-stat troponin, ED     Status: None   Collection Time: 08/09/2014  1:36 PM  Result Value Ref Range   Troponin i, poc 0.00 0.00 - 0.08 ng/mL   Comment 3            Comment: Due to the release kinetics of cTnI, a negative result within the first hours of the onset of symptoms does not rule out myocardial infarction with certainty. If myocardial infarction is still suspected, repeat the test at appropriate intervals.   I-Stat CG4 Lactic Acid, ED     Status: None   Collection Time: 08/01/2014  1:39 PM  Result Value Ref Range   Lactic Acid, Venous 1.31 0.5 - 2.2 mmol/L    Radiology Reports: Dg Chest 2 View  08/11/2014   CLINICAL DATA:  Cough, weakness shortness of breath.  Pneumonia.  EXAM: CHEST  2 VIEW  COMPARISON:  Previous examinations, the most recent dated 07/02/2011.  FINDINGS: Normal sized heart. Moderate sized hiatal hernia. Interval patchy opacity in the superior segments of both lower lobes and possibly in the right upper lobes. Interval small nodular density at the left lung apex. , right upper lobe and right middle lobe. Suggestion minimal bilateral pleural fluid on 1 of the lateral views, not seen the other views. Mild thoracic and lumbar spine degenerative changes and old 10% lower lumbar spine superior endplate compression deformity.  IMPRESSION: 1. Bilateral multi lobar pneumonia. 2. Possible minimal bilateral pleural. 3. Moderate-sized hiatal hernia. 4. Possible small left apical lung nodule. Attention to this area on follow-up chest radiographs is recommended.   Electronically Signed   By: Enrique Sack M.D.   On: 08/25/2014 13:54    Electrocardiogram: sinus rhythm at 82 bpm. Normal axis. Intervals normal. Nonspecific T wave changes. No concerning ST changes.  Problem List  Principal Problem:   CAP (community acquired pneumonia) Active Problems:   Rheumatoid arthritis   Myalgia and  myositis   Chest pain   DM type 2 (diabetes mellitus, type 2)   Dehydration   Hyponatremia   Assessment: this is a 80 year old Caucasian female who presents with generalized weakness. She is noted to be dehydrated on physical examination. She has evidence for bilateral pneumonia on chest x-ray. She has hyponatremia. She has failed outpatient treatment.  Plan: #1 Community-acquired pneumonia: She'll be treated with ceftriaxone and azithromycin. She is allergic to multiple antibiotics, which makes choosing an appropriate agent challenging. Oxygen as needed. Check urine for strep and Legionella considering her hyponatremia.  #2 chest pain: Sounds pleuritic. Most likely due to pneumonia. Troponin is normal. EKG shows nonspecific changes. Troponin levels will be repeated.  #3 history of rheumatoid arthritis on chronic steroids: Continue with her hydroxychloroquine. We will give her a slightly higher dose of prednisone due to acute stress.  #4 Dehydration and hyponatremia: Give her normal saline infusion. Repeat labs in the  morning.  #5 chronic Abdominal pain: Has had a recent CT for same. She also has fibromyalgia and mentions that her abdominal pain is chronic. She is on morphine at home for her chronic pain, which will be continued. Continue to monitor.  #6 Diabetes mellitus type 2: Sliding scale insulin coverage will be initiated. Continue with Lantus. Check HbA1c.   DVT Prophylaxis: heparin Code Status: full code Family Communication: discussed with the patient and her husband  Disposition Plan: admit to telemetry   Further management decisions will depend on results of further testing and patient's response to treatment.   [facility]  [facility] Pager [phone]  If 7PM-7AM, please contact night-coverage [email or web address] Password [email or web address]  07/31/2014, 3:14 PM

## 2014-08-03 NOTE — ED Provider Notes (Signed)
Medical screening examination/treatment/procedure(s) were conducted as a shared visit with non-physician practitioner(s) and myself.  I personally evaluated the patient during the encounter.   EKG Interpretation   Date/Time:  Thursday August 03 2014 13:16:22 EST Ventricular Rate:  82 PR Interval:  154 QRS Duration: 96 QT Interval:  387 QTC Calculation: 452 R Axis:   17 Text Interpretation:  Sinus rhythm Abnormal R-wave progression, early  transition Nonspecific T abnormalities, anterior leads compared to prior,  now nonspecific t wave abnormalities.  Confirmed by [facility]  MD, Thurston Pounds  ([id number]) on 08/10/2014 4:10:30 PM      80 yo female presenting with cough and malaise.  She thought she had the flu last week.  Went to PCP Monday and started on azithromycin for CAP.  No improvement with these meds.  On exam, ill appearing, but nontoxic, not distressed, normal respiratory effort, normal perfusion, lungs CTAB.  CXR concerning for bilateral multilobar pneumonia.  Treated for CAP.  Admit for failed outpatient treatment.  Clinical Impression: 1. CAP (community acquired pneumonia)   2. SOB (shortness of breath)   3. Cough       Warnell Forester, MD 08/19/2014 [phone]

## 2014-08-03 NOTE — ED Notes (Signed)
MD at bedside. 

## 2014-08-03 NOTE — ED Notes (Signed)
Admitting MD at bedside Patient given IVF and antibiotics, see MAR Patient extremely histrionic and overly dramatic while scanning patient ID band and connecting IV tubing Patient states, "I have fibromyalgia!" During assessment by admitting MD, patient again screaming when blanket pulled back--patient again attributes this to her fibromyalgia Patient in NAD Awaiting bed

## 2014-08-04 DIAGNOSIS — M609 Myositis, unspecified: Secondary | ICD-10-CM

## 2014-08-04 DIAGNOSIS — M791 Myalgia: Secondary | ICD-10-CM

## 2014-08-04 DIAGNOSIS — E860 Dehydration: Secondary | ICD-10-CM

## 2014-08-04 LAB — CBC
HCT: 30.6 % — ABNORMAL LOW (ref 36.0–46.0)
Hemoglobin: 9.9 g/dL — ABNORMAL LOW (ref 12.0–15.0)
MCH: 25.6 pg — AB (ref 26.0–34.0)
MCHC: 32.4 g/dL (ref 30.0–36.0)
MCV: 79.1 fL (ref 78.0–100.0)
Platelets: 547 10*3/uL — ABNORMAL HIGH (ref 150–400)
RBC: 3.87 MIL/uL (ref 3.87–5.11)
RDW: 14.1 % (ref 11.5–15.5)
WBC: 14.1 10*3/uL — ABNORMAL HIGH (ref 4.0–10.5)

## 2014-08-04 LAB — COMPREHENSIVE METABOLIC PANEL
ALBUMIN: 2.1 g/dL — AB (ref 3.5–5.2)
ALT: 17 U/L (ref 0–35)
AST: 19 U/L (ref 0–37)
Alkaline Phosphatase: 65 U/L (ref 39–117)
Anion gap: 11 (ref 5–15)
BUN: 10 mg/dL (ref 6–23)
CALCIUM: 8.5 mg/dL (ref 8.4–10.5)
CHLORIDE: 96 meq/L (ref 96–112)
CO2: 24 meq/L (ref 19–32)
CREATININE: 0.67 mg/dL (ref 0.50–1.10)
GFR calc Af Amer: >90 mL/min (ref >90)
GFR calc non Af Amer: 82 mL/min — ABNORMAL LOW (ref >90)
Glucose, Bld: 151 mg/dL — ABNORMAL HIGH (ref 70–99)
Potassium: 4.5 mEq/L (ref 3.7–5.3)
Sodium: 131 mEq/L — ABNORMAL LOW (ref 137–147)
Total Bilirubin: 0.3 mg/dL (ref 0.3–1.2)
Total Protein: 6 g/dL (ref 6.0–8.3)

## 2014-08-04 LAB — GLUCOSE, CAPILLARY
GLUCOSE-CAPILLARY: 137 mg/dL — AB (ref 70–99)
GLUCOSE-CAPILLARY: 203 mg/dL — AB (ref 70–99)
GLUCOSE-CAPILLARY: 253 mg/dL — AB (ref 70–99)
GLUCOSE-CAPILLARY: 98 mg/dL (ref 70–99)
Glucose-Capillary: 88 mg/dL (ref 70–99)

## 2014-08-04 LAB — HEMOGLOBIN A1C
Hgb A1c MFr Bld: 7.2 % — ABNORMAL HIGH (ref <5.7)
Mean Plasma Glucose: 160 mg/dL — ABNORMAL HIGH (ref <117)

## 2014-08-04 LAB — HIV ANTIBODY (ROUTINE TESTING W REFLEX): HIV 1&2 Ab, 4th Generation: NONREACTIVE

## 2014-08-04 LAB — TROPONIN I: Troponin I: <0.3 ng/mL (ref <0.30)

## 2014-08-04 NOTE — Care Management Note (Addendum)
    Page 1 of 2   08/19/2014     1:29:05 PM CARE MANAGEMENT NOTE 08/27/2014  Patient:  Patricia Mosley, Patricia Mosley   Account Number:  [id number]  Date Initiated:  08/04/2014  Documentation initiated by:  Lanier Clam  Subjective/Objective Assessment:   80 Y/O F ADMITTED W/PNA.     Action/Plan:   FROM HOME.   Anticipated DC Date:  08/13/2014   Anticipated DC Plan:  HOME/SELF CARE      DC Planning Services  CM consult      Choice offered to / List presented to:             Status of service:  In process, will continue to follow Medicare Important Message given?   (If response is "NO", the following Medicare IM given date fields will be blank) Date Medicare IM given:   Medicare IM given by:   Date Additional Medicare IM given:   Additional Medicare IM given by:    Discharge Disposition:    Per UR Regulation:  Reviewed for med. necessity/level of care/duration of stay  If discussed at Long Length of Stay Meetings, dates discussed:   08/08/2014  08/09/2014    Comments:  08/13/2014/Rhonda L. Earlene Plater, RN, BSN, CCM: Chart review for medical necessity and patient discharge needs. Case Manager will follow for patient condition changes. chart notes for progression: Summary - Worsening ARDS, sub cutaneous air raises concern for occult pneumothorax vs pharyngeal/esophageal leak - discussed with GI & TCTS - high risk for endoscopy, would perform CT neck/chest if able to transport ENDOSCOPY PROCEDURE REPORT  PROCEDURE:  EGD w/ tube or catheter placement ASA CLASS:     Class IV INDICATIONS:  ARDS, due to pneumonia? Very large HH, unable to place NG tube; also new pneumomediastinum overnight.Marland Kitchen MEDICATIONS: was already intubated, sedated in ICU  Rhonda Davis,RN,BSN,CCM: Cart reviewed for needs. Chart note; patient became hypoxic on 11/8 and placed on nonrebreather. Chest x-ray repeated showing worsening bilateral pneumonia. Patient complaining of chest pain as well. CT angiogram of the chest  done to rule out PE was negative but showed diffuse bilateral pneumonia with small bilateral effusions. Antibiotic escalated to IV vancomycin and cefepime. Patient transferred to stepdown unit. She has been on nonrebreather overnight with dropping O2 sats. PCCM and ID consulted. Patient intubated this morning.  08/04/14 KATHY MAHABIR RN,BSN NCM [phone] NO ANTICIPATED D/C NEEDS.

## 2014-08-04 NOTE — Evaluation (Signed)
Physical Therapy Evaluation Patient Details Name: Patricia Mosley MRN: [id number] DOB: 1934-03-15 Today's Date: 08/04/2014   History of Present Illness  Patient admitted with CAP  Clinical Impression  Patient presents with decreased independence with mobility due to deficits listed in PT problem list.  She will benefit from skilled PT in the acute setting to allow return home with spouse assist and HHPT.    Follow Up Recommendations Home health PT    Equipment Recommendations  Other (comment) (does not want device, but needs cane or walker)    Recommendations for Other Services       Precautions / Restrictions Precautions Precautions: Fall      Mobility  Bed Mobility Overal bed mobility: Modified Independent                Transfers Overall transfer level: Needs assistance Equipment used: None Transfers: Sit to/from Stand Sit to Stand: Supervision            Ambulation/Gait Ambulation/Gait assistance: Min assist Ambulation Distance (Feet): 200 Feet Assistive device: 1 person hand held assist Gait Pattern/deviations: Step-through pattern;Decreased stride length     General Gait Details: unsteady pt reports due to medication  Stairs            Wheelchair Mobility    Modified Rankin (Stroke Patients Only)       Balance Overall balance assessment: Needs assistance           Standing balance-Leahy Scale: Fair                               Pertinent Vitals/Pain Pain Assessment: No/denies pain    Home Living Family/patient expects to be discharged to:: Private residence Living Arrangements: Spouse/significant other Available Help at Discharge: Family Type of Home: House       Home Layout: Two level Home Equipment: None      Prior Function Level of Independence: Independent               Hand Dominance        Extremity/Trunk Assessment               Lower Extremity Assessment: Generalized  weakness      Cervical / Trunk Assessment: Kyphotic  Communication   Communication: No difficulties  Cognition Arousal/Alertness: Awake/alert Behavior During Therapy: WFL for tasks assessed/performed Overall Cognitive Status: Within Functional Limits for tasks assessed                      General Comments      Exercises        Assessment/Plan    PT Assessment Patient needs continued PT services  PT Diagnosis Abnormality of gait;Generalized weakness   PT Problem List Decreased strength;Decreased mobility;Decreased balance;Decreased knowledge of use of DME;Decreased activity tolerance;Cardiopulmonary status limiting activity  PT Treatment Interventions DME instruction;Gait training;Stair training;Functional mobility training;Therapeutic activities;Patient/family education;Balance training;Therapeutic exercise   PT Goals (Current goals can be found in the Care Plan section) Acute Rehab PT Goals Patient Stated Goal: To get stronger PT Goal Formulation: With patient Time For Goal Achievement: 08/18/14 Potential to Achieve Goals: Good    Frequency Min 3X/week   Barriers to discharge        Co-evaluation               End of Session Equipment Utilized During Treatment: Gait belt Activity Tolerance: Patient limited by fatigue Patient left: in bed;with call  bell/phone within reach           Time: 1613-1640 PT Time Calculation (min): 27 min   Charges:   PT Evaluation $Initial PT Evaluation Tier I: 1 Procedure PT Treatments $Gait Training: 8-22 mins   PT G Codes:          Patricia Mosley,Patricia Mosley 08-25-14, 5:01 PM Sheran Lawless, PT [phone] 08/25/2014

## 2014-08-04 NOTE — Progress Notes (Signed)
[facility] PROGRESS NOTE  Patricia Mosley JSE:[id number] DOB: 1934/01/17 DOA: 08/26/2014 PCP: Ezequiel Kayser, MD   Brief Narrative 80 year old female with history of type 2 diabetes mellitus, rheumatoid arthritis on chronic steroid, fibromyalgia on morphine at home presented with generalized weakness for past 1 week associated with dry cough. Chest x-ray done as outpatient showed pneumonia and was started on Z-Pak however symptoms did not improve and patient came to the emergency department. A chest x-ray done in the ED showed bilateral multilobar pneumonia as well as hiatal hernia. Also showed small left apical lung nodule. He was also found to have hyponatremia. Patient admitted for community-acquired pneumonia.  Assessment/Plan: Community-acquired pneumonia Started on empiric Rocephin and azithromycin.follow urine for strep antigen and Legionella antigen. Blood culture not sent on admission. -supportive care with IV fluids, antitussives and Tylenol.  Hyponatremia Possibly hypovolemic as patient appeared quite dehydrated on admission. Continue IV hydration and sodium improved in a.m. Labs.  Chronic rheumatoid arthritis Continue prednisone an plaquenil  GERD with hiatal hernia Continue Protonix and Pepcid  Type 2 diabetes mellitus A1c of 7.2. Resume home dose Lantus and sliding scale insulin   Chronic abdominal Lower extremity pain History of chronic fibromyalgia and is on frequent morphine at home which is been continued.  Generalized weakness and protein calorie malnutrition Physical therapy and nutrition consult requested.    DVT prophylaxis: Subcutaneous heparin  Diet: Diabetic    Code Status: full code Family Communication: none at bedside Disposition Plan: home likely in next 48 hours.   Consultants:  none  Procedures:  none  Antibiotics:  Rocephin and azithromycin 11/5--  HPI/Subjective: Patient seen and examined. Reports her breathing to be  better and cough improved.reports feeling weak  Objective: Filed Vitals:   08/04/14 0529  BP: 114/55  Pulse: 81  Temp: 99.1 F (37.3 C)  Resp: 16    Intake/Output Summary (Last 24 hours) at 08/04/14 1158 Last data filed at 08/04/14 0700  Gross per 24 hour  Intake 1087.5 ml  Output      0 ml  Net 1087.5 ml   Filed Weights   08/04/14 0529  Weight: 63.7 kg (140 lb 6.9 oz)    Exam:   General:  Elderly thin built female in no acute distress  HEENT: No pallor, moist mucosa,, temporal wasting  Chest: Clear to auscultation bilaterally  CVS: Normal S1 and S2, no murmurs  Abdomen: Soft, nontender, nondistended, bowel sounds present    extremities: Warm, and her to pressure over bilateral legs, swelling or erythema  CNS: Alert and oriented  Data Reviewed: Basic Metabolic Panel:  Recent Labs Lab 08/19/2014 1326 08/04/14 0300  NA 124* 131*  K 4.2 4.5  CL 91* 96  CO2 20 24  GLUCOSE 177* 151*  BUN 9 10  CREATININE 0.67 0.67  CALCIUM 8.3* 8.5   Liver Function Tests:  Recent Labs Lab 08/04/14 0300  AST 19  ALT 17  ALKPHOS 65  BILITOT 0.3  PROT 6.0  ALBUMIN 2.1*   No results for input(s): LIPASE, AMYLASE in the last 168 hours. No results for input(s): AMMONIA in the last 168 hours. CBC:  Recent Labs Lab 08/06/2014 1326 08/04/14 0300  WBC 15.3* 14.1*  NEUTROABS 13.5*  --   HGB 10.2* 9.9*  HCT 31.7* 30.6*  MCV 79.4 79.1  PLT 525* 547*   Cardiac Enzymes:  Recent Labs Lab 07/31/2014 2100 08/04/14 0300  TROPONINI <0.30 <0.30   BNP (last 3 results) No results for input(s): PROBNP in the  last 8760 hours. CBG:  Recent Labs Lab 08-24-2014 1653 08-24-2014 2150 08/04/14 0738  GLUCAP 189* 88 137*    No results found for this or any previous visit (from the past 240 hour(s)).   Studies: Dg Chest 2 View  2014-08-24   CLINICAL DATA:  Cough, weakness shortness of breath.  Pneumonia.  EXAM: CHEST  2 VIEW  COMPARISON:  Previous examinations, the most  recent dated 07/02/2011.  FINDINGS: Normal sized heart. Moderate sized hiatal hernia. Interval patchy opacity in the superior segments of both lower lobes and possibly in the right upper lobes. Interval small nodular density at the left lung apex. , right upper lobe and right middle lobe. Suggestion minimal bilateral pleural fluid on 1 of the lateral views, not seen the other views. Mild thoracic and lumbar spine degenerative changes and old 10% lower lumbar spine superior endplate compression deformity.  IMPRESSION: 1. Bilateral multi lobar pneumonia. 2. Possible minimal bilateral pleural. 3. Moderate-sized hiatal hernia. 4. Possible small left apical lung nodule. Attention to this area on follow-up chest radiographs is recommended.   Electronically Signed   By: Gordan Payment M.D.   On: August 24, 2014 13:54    Scheduled Meds: . azithromycin  500 mg Intravenous Q24H  . calcium-vitamin D  1 tablet Oral Daily  . cefTRIAXone (ROCEPHIN)  IV  1 g Intravenous Once  . cefTRIAXone (ROCEPHIN)  IV  1 g Intravenous Q24H  . famotidine  20 mg Oral BID  . FLUoxetine  20 mg Oral Daily  . folic acid  1 mg Oral BID  . heparin  5,000 Units Subcutaneous 3 times per day  . hydroxychloroquine  200 mg Oral BID  . insulin aspart  0-15 Units Subcutaneous TID WC  . insulin glargine  8 Units Subcutaneous Daily  . LORazepam  0.5 mg Oral QHS  . pantoprazole  40 mg Oral Daily  . predniSONE  20 mg Oral Q breakfast  . sodium chloride  3 mL Intravenous Q12H   Continuous Infusions:     Time spent: 25 minutes    Raney Antwine  [facility] Pager [phone]. If 7PM-7AM, please contact night-coverage at [email or web address], password [facility] 08/04/2014, 11:58 AM  LOS: 1 day

## 2014-08-04 NOTE — Progress Notes (Signed)
Nutrition Brief Note  Patient identified on the Malnutrition Screening Tool (MST) Report  Wt Readings from Last 15 Encounters:  08/04/14 140 lb 6.9 oz (63.7 kg)  07/07/14 137 lb (62.143 kg)  11/25/13 145 lb (65.772 kg)  11/07/13 145 lb 12.8 oz (66.134 kg)  10/14/13 142 lb (64.411 kg)  07/15/13 140 lb (63.504 kg)  03/10/12 140 lb (63.504 kg)  09/24/11 138 lb (62.596 kg)  05/07/10 149 lb (67.586 kg)    Body mass index is 24.09 kg/(m^2). Patient meets criteria for WNL based on current BMI.   Current diet order is CHO Modified, patient is consuming approximately 50% of meals at this time. Labs and medications reviewed.   RD met with patient who states she has not nutrition concerns at this time.  Her weight appears to be stable at 140 lbs.  She is anticipating lunch despite not feeling well. No nutrition interventions warranted at this time. If nutrition issues arise, please consult RD.   Brynda Greathouse, MS RD LDN Clinical Inpatient Dietitian Weekend/After hours pager: [phone]

## 2014-08-05 ENCOUNTER — Inpatient Hospital Stay (HOSPITAL_COMMUNITY): Payer: Medicare Other

## 2014-08-05 DIAGNOSIS — R0782 Intercostal pain: Secondary | ICD-10-CM

## 2014-08-05 DIAGNOSIS — W19XXXA Unspecified fall, initial encounter: Secondary | ICD-10-CM

## 2014-08-05 DIAGNOSIS — S0181XA Laceration without foreign body of other part of head, initial encounter: Secondary | ICD-10-CM | POA: Diagnosis not present

## 2014-08-05 LAB — BASIC METABOLIC PANEL
ANION GAP: 11 (ref 5–15)
BUN: 11 mg/dL (ref 6–23)
CO2: 26 mEq/L (ref 19–32)
Calcium: 8.7 mg/dL (ref 8.4–10.5)
Chloride: 97 mEq/L (ref 96–112)
Creatinine, Ser: 0.72 mg/dL (ref 0.50–1.10)
GFR, EST NON AFRICAN AMERICAN: 80 mL/min — AB (ref >90)
GLUCOSE: 146 mg/dL — AB (ref 70–99)
Potassium: 4 mEq/L (ref 3.7–5.3)
SODIUM: 134 meq/L — AB (ref 137–147)

## 2014-08-05 LAB — CBC
HCT: 31.1 % — ABNORMAL LOW (ref 36.0–46.0)
Hemoglobin: 9.8 g/dL — ABNORMAL LOW (ref 12.0–15.0)
MCH: 24.9 pg — ABNORMAL LOW (ref 26.0–34.0)
MCHC: 31.5 g/dL (ref 30.0–36.0)
MCV: 79.1 fL (ref 78.0–100.0)
PLATELETS: 558 10*3/uL — AB (ref 150–400)
RBC: 3.93 MIL/uL (ref 3.87–5.11)
RDW: 14.1 % (ref 11.5–15.5)
WBC: 16.5 10*3/uL — ABNORMAL HIGH (ref 4.0–10.5)

## 2014-08-05 LAB — GLUCOSE, CAPILLARY
Glucose-Capillary: 160 mg/dL — ABNORMAL HIGH (ref 70–99)
Glucose-Capillary: 176 mg/dL — ABNORMAL HIGH (ref 70–99)
Glucose-Capillary: 269 mg/dL — ABNORMAL HIGH (ref 70–99)
Glucose-Capillary: 104 mg/dL — ABNORMAL HIGH (ref 70–99)

## 2014-08-05 LAB — TROPONIN I: Troponin I: <0.3 ng/mL (ref <0.30)

## 2014-08-05 MED ORDER — PROMETHAZINE HCL 25 MG/ML IJ SOLN
6.2500 mg | INTRAMUSCULAR | Status: DC | PRN
  Administered 2014-08-06: 12.5 mg via INTRAVENOUS
  Filled 2014-08-05: qty 1

## 2014-08-05 MED ORDER — SACCHAROMYCES BOULARDII 250 MG PO CAPS
250.0000 mg | ORAL_CAPSULE | Freq: Two times a day (BID) | ORAL | Status: DC
  Administered 2014-08-05 – 2014-08-11 (×9): 250 mg via ORAL
  Filled 2014-08-05 (×15): qty 1

## 2014-08-05 MED ORDER — LORAZEPAM 2 MG/ML IJ SOLN
1.0000 mg | Freq: Once | INTRAMUSCULAR | Status: AC
  Administered 2014-08-05: 1 mg via INTRAVENOUS
  Filled 2014-08-05: qty 1

## 2014-08-05 MED ORDER — MAGIC MOUTHWASH
15.0000 mL | Freq: Four times a day (QID) | ORAL | Status: DC | PRN
  Filled 2014-08-05 (×2): qty 15

## 2014-08-05 MED ORDER — POLYETHYLENE GLYCOL 3350 17 G PO PACK
17.0000 g | PACK | Freq: Two times a day (BID) | ORAL | Status: DC
  Administered 2014-08-06: 17 g via ORAL
  Filled 2014-08-05 (×3): qty 1

## 2014-08-05 MED ORDER — FAMOTIDINE 20 MG PO TABS
40.0000 mg | ORAL_TABLET | Freq: Every day | ORAL | Status: DC
  Administered 2014-08-07: 40 mg via ORAL
  Filled 2014-08-05: qty 2
  Filled 2014-08-05: qty 1
  Filled 2014-08-05: qty 2

## 2014-08-05 MED ORDER — KETOROLAC TROMETHAMINE 15 MG/ML IJ SOLN
15.0000 mg | Freq: Four times a day (QID) | INTRAMUSCULAR | Status: DC | PRN
  Administered 2014-08-06: 15 mg via INTRAVENOUS
  Filled 2014-08-05 (×2): qty 1

## 2014-08-05 MED ORDER — BISACODYL 10 MG RE SUPP: 10.0000 mg | Freq: Two times a day (BID) | RECTAL | Status: DC | PRN

## 2014-08-05 MED ORDER — ALUM & MAG HYDROXIDE-SIMETH 200-200-20 MG/5ML PO SUSP: 30.0000 mL | Freq: Four times a day (QID) | ORAL | Status: DC | PRN

## 2014-08-05 MED ORDER — PANTOPRAZOLE SODIUM 40 MG PO TBEC
40.0000 mg | DELAYED_RELEASE_TABLET | Freq: Two times a day (BID) | ORAL | Status: DC
  Administered 2014-08-05 – 2014-08-07 (×3): 40 mg via ORAL
  Filled 2014-08-05 (×4): qty 1

## 2014-08-05 MED ORDER — LIP MEDEX EX OINT
1.0000 "application " | TOPICAL_OINTMENT | Freq: Two times a day (BID) | CUTANEOUS | Status: DC
  Administered 2014-08-05 – 2014-08-11 (×14): 1 via TOPICAL
  Filled 2014-08-05 (×4): qty 7

## 2014-08-05 NOTE — Progress Notes (Signed)
[facility] PROGRESS NOTE  Patricia Mosley GEZ:[id number] DOB: Apr 19, 1934 DOA: 08/01/2014 PCP: Ezequiel Kayser, MD  Brief Narrative 80 year old female with history of type 2 diabetes mellitus, rheumatoid arthritis on chronic steroid, fibromyalgia on morphine at home presented with generalized weakness for past 1 week associated with dry cough. Chest x-ray done as outpatient showed pneumonia and was started on Z-Pak however symptoms did not improve and patient came to the emergency department. A chest x-ray done in the ED showed bilateral multilobar pneumonia as well as hiatal hernia. Also showed small left apical lung nodule. He was also found to have hyponatremia. Patient admitted for community-acquired pneumonia.  Assessment/Plan: Community-acquired pneumonia -empiric Rocephin and azithromycin. pending urine for strep antigen and Legionella antigen. Blood culture not sent on admission. -supportive care with IV fluids, antitussives and Tylenol.  Hyponatremia Possibly hypovolemic as patient appeared quite dehydrated on admission. Much improved with IV fluids  Fall on 11/7 Patient fell at bedside reportedly after tripping on her clothes. She landed on her right side and lacerated over the right infraorbital area. CT head negative for any bleed or acute injury. She reports right-sided chest pain as her chest landed on her telemetry box. Tender to palpation. EKG unremarkable. X-ray of the ribs negative for any fracture and again showed bilateral pneumonia. -Added IV Toradol when necessary for pain in addition to her home dose morphine -surgery consulted to evaluate the right eye laceration ( if needs sutures)  Leukocytosis Possibly from underlying pneumonia. Patient on chronic low-dose prednisone as well.  Chronic rheumatoid arthritis Continue prednisone an plaquenil  GERD with hiatal hernia Continue Protonix and Pepcid  Type 2 diabetes mellitus A1c of 7.2. Resume home dose Lantus and  sliding scale insulin   Chronic abdominal Lower extremity pain History of chronic fibromyalgia and is on frequent morphine at home which is been continued.  Generalized weakness and protein calorie malnutrition Physical therapy and nutrition consult requested.    DVT prophylaxis: Subcutaneous heparin  Diet: Diabetic    Code Status: full code Family Communication: husband at bedside Disposition Plan: home likely in next 48 hours. Home health PT recommended   Consultants:  [place] [facility]  Procedures:  none  Antibiotics:  Rocephin and azithromycin 11/5--  HPI/Subjective: Patient seen and examined. Reports her breathing to be better and cough improved.reports feeling weak  Objective: Filed Vitals:   08/05/14 0624  BP: 143/71  Pulse: 89  Temp: 98.6 F (37 C)  Resp: 20   No intake or output data in the 24 hours ending 08/05/14 1410 Filed Weights   08/04/14 0529  Weight: 63.7 kg (140 lb 6.9 oz)    Exam:   General:  Elderly thin built female lying in bed crying in pain  HEENT: Laceration over right infraorbital area about half an inch without any active bleeding  Chest: Tender to pressure over right side of the chest, clear breath sounds bilaterally, no added sounds  CVS: Normal S1 and S2, no murmurs  Abdomen: Soft, nontender, nondistended, bowel sounds present  extremities: Warm, tender to pressure over bilateral legs(chronic)  Data Reviewed: Basic Metabolic Panel:  Recent Labs Lab 08/11/2014 1326 08/04/14 0300 08/05/14 0909  NA 124* 131* 134*  K 4.2 4.5 4.0  CL 91* 96 97  CO2 20 24 26   GLUCOSE 177* 151* 146*  BUN 9 10 11   CREATININE 0.67 0.67 0.72  CALCIUM 8.3* 8.5 8.7   Liver Function Tests:  Recent Labs Lab 08/04/14 0300  AST 19  ALT 17  ALKPHOS 65  BILITOT 0.3  PROT 6.0  ALBUMIN 2.1*   No results for input(s): LIPASE, AMYLASE in the last 168 hours. No results for input(s): AMMONIA in the last 168 hours. CBC:  Recent  Labs Lab 08/21/2014 1326 08/04/14 0300 08/05/14 0909  WBC 15.3* 14.1* 16.5*  NEUTROABS 13.5*  --   --   HGB 10.2* 9.9* 9.8*  HCT 31.7* 30.6* 31.1*  MCV 79.4 79.1 79.1  PLT 525* 547* 558*   Cardiac Enzymes:  Recent Labs Lab 08/25/2014 2100 08/04/14 0300 08/05/14 0909  TROPONINI <0.30 <0.30 <0.30   BNP (last 3 results) No results for input(s): PROBNP in the last 8760 hours. CBG:  Recent Labs Lab 08/04/14 0738 08/04/14 1146 08/04/14 1614 08/04/14 2210 08/05/14 0809  GLUCAP 137* 203* 253* 98 160*    No results found for this or any previous visit (from the past 240 hour(s)).   Studies: Dg Ribs Bilateral  08/05/2014   CLINICAL DATA:  Fall.  Rib pain.  EXAM: BILATERAL RIBS - 3+ VIEW  COMPARISON:  08/01/2014  FINDINGS: No fracture or other bone lesions are seen involving the ribs.  Bilateral pneumonia, stable from two days ago. No cardiomegaly ; stable aortic and hilar contours. Moderate hiatal hernia.  IMPRESSION: 1. No acute osseous findings. 2. Bilateral pneumonia. 3. Hiatal hernia.   Electronically Signed   By: Tiburcio Pea M.D.   On: 08/05/2014 11:59   Ct Head Wo Contrast  08/05/2014   CLINICAL DATA:  80 year old female status post fall (unwitnessed). Laceration to the right periorbital soft tissues. Initial encounter.  EXAM: CT HEAD WITHOUT CONTRAST  TECHNIQUE: Contiguous axial images were obtained from the base of the skull through the vertex without intravenous contrast.  COMPARISON:  Most recent prior CT scan of the head 07/09/2006  FINDINGS: Negative for acute intracranial hemorrhage, acute infarction, mass, mass effect, hydrocephalus or midline shift. Gray-white differentiation is preserved throughout. Similar pattern of the periventricular, subcortical and deep white matter hypoattenuation most consistent with the sequelae of longstanding chronic microvascular ischemic white matter disease. No focal scalp hematoma or evidence of calvarial fracture. The bilateral globes  and orbits are symmetric and unremarkable. Normal aeration of the mastoid air cells and visualized paranasal sinuses. Atherosclerotic calcifications present within both cavernous carotid arteries.  IMPRESSION: 1. No acute intracranial abnormality. 2. Stable pattern of chronic microvascular ischemic white matter disease.   Electronically Signed   By: Malachy Moan M.D.   On: 08/05/2014 06:53    Scheduled Meds: . azithromycin  500 mg Intravenous Q24H  . calcium-vitamin D  1 tablet Oral Daily  . cefTRIAXone (ROCEPHIN)  IV  1 g Intravenous Once  . cefTRIAXone (ROCEPHIN)  IV  1 g Intravenous Q24H  . [START ON 08/06/2014] famotidine  40 mg Oral QHS  . FLUoxetine  20 mg Oral Daily  . folic acid  1 mg Oral BID  . heparin  5,000 Units Subcutaneous 3 times per day  . hydroxychloroquine  200 mg Oral BID  . insulin aspart  0-15 Units Subcutaneous TID WC  . insulin glargine  8 Units Subcutaneous Daily  . lip balm  1 application Topical BID  . LORazepam  0.5 mg Oral QHS  . pantoprazole  40 mg Oral BID  . polyethylene glycol  17 g Oral BID  . predniSONE  20 mg Oral Q breakfast  . saccharomyces boulardii  250 mg Oral BID  . sodium chloride  3 mL Intravenous Q12H   Continuous Infusions:  Time spent: 25 minutes    Eddie North  [facility] Pager [phone]. If 7PM-7AM, please contact night-coverage at [email or web address], password [facility] 08/05/2014, 2:10 PM  LOS: 2 days

## 2014-08-05 NOTE — Progress Notes (Signed)
Pt had unwitnessed fall.  Per pt, she had to use the bathroom and got up on her own.  Laceration to right eye.  Pt c/o chest pain.  EKG showed sinus tach with nonspecific ST and T wave abnormality.  MD notified.  Troponin, CT head, and ativan 1mg  IV ordered.  Spouse notified.

## 2014-08-05 NOTE — Plan of Care (Signed)
Problem: Phase I Progression Outcomes Goal: Pain controlled with appropriate interventions Outcome: Not Progressing  Comments:  Patient gives pain level 8 out of 10 most of the time. Pt has history of fibromyalgia.

## 2014-08-05 NOTE — Consult Note (Addendum)
[name] [name], [place] [address] Phone: [phone] FAX: [place]  05-23-1934 [id number]  CARE TEAM:  PCP: Jerlyn Ly, MD  Outpatient Care Team: Patient Care Team: Jerlyn Ly, MD as PCP - General  Inpatient Treatment Team: Treatment Team: Attending Provider: Louellen Molder, MD; Rounding Team: Fatima Blank, MD; Respiratory Therapist: Miquel Dunn, RRT; Technician: Donnal Debar, NT; Occupational Therapist: Merleen Milliner, OT; Consulting Physician: Nolon Nations, MD  This patient is a 80 y.o.female who presents today for surgical evaluation at the request of Dr Clementeen Graham.   Reason for evaluation: Fall with facial lac  Elderly woman with numerous health issues including diabetes, steroid dependent retorted arthritis, fibromyalgia, large hiatal hernia with reflux, chronic pain on chronic morphine.  Admitted for probable pneumonia.  Slipped and hit monitor in room while walking to bathroom unassisted.  No definite loss of consciousness.  CT scan of head negative.  EKG showed mild sinus tachycardia.  This happened at 3 in the morning.  Called to evaluate laceration on right cheek.  Husband in room.  Nurse and room.  Patient moaning complaining of abdominal pain.  Struggles with chronic abdominal pain.  Distant history of colon abscess presumed secondary to diverticulitis.  Resolved.  Treated 2012.  Another tack 6 months later.  No attacks since.  Claims regular daily bowel movements.  Had colonoscopy 4 years ago that was noting sigmoid diverticulosis but no cancer or tumor.  Had endoscopy done 2012 confirmed hiatal hernia.  CT scan and chest x-ray confirmed hiatal hernia.  Patient takes Protonix and ranitidine.  That usually helps control reflux.  No severe nausea vomiting or retching at home.  No fevers chills or sweats.  And tolerate eating relatively well.  Patient has some soreness on  her chest wall.  X-ray ribs negative.    Based on concerns of laceration of face.  Trauma surgical consultation requested.  Past Medical History  Diagnosis Date  . Diabetes mellitus     "prednisone induced" per patient  . Osteoporosis   . Rheumatoid arthritis(714.0)   . Fibromyalgia     Past Surgical History  Procedure Laterality Date  . Tonsillectomy    . "womb suspension" and appendectomy      age 68  . Dilation and curettage of uterus    . Cystoscopy      evaluating UTI    History   Social History  . Marital Status: Married    Spouse Name: N/A    Number of Children: N/A  . Years of Education: N/A   Occupational History  . Not on file.   Social History Main Topics  . Smoking status: Never Smoker   . Smokeless tobacco: Never Used  . Alcohol Use: No  . Drug Use: No  . Sexual Activity: Not on file   Other Topics Concern  . Not on file   Social History Narrative    Family History  Problem Relation Age of Onset  . Heart disease Mother     Current Facility-Administered Medications  Medication Dose Route Frequency Provider Last Rate Last Dose  . albuterol (PROVENTIL) (2.5 MG/3ML) 0.083% nebulizer solution 2.5 mg  2.5 mg Nebulization Q2H PRN Bonnielee Haff, MD      . alum & mag hydroxide-simeth (MAALOX/MYLANTA) 200-200-20 MG/5ML suspension 30 mL  30 mL Oral Q6H PRN Michael Boston, MD      . azithromycin (ZITHROMAX) 500  mg in dextrose 5 % 250 mL IVPB  500 mg Intravenous Q24H Bonnielee Haff, MD   500 mg at 08/05/14 1344  . bisacodyl (DULCOLAX) suppository 10 mg  10 mg Rectal Q12H PRN Michael Boston, MD      . calcium-vitamin D (OSCAL WITH D) 500-200 MG-UNIT per tablet 1 tablet  1 tablet Oral Daily Bonnielee Haff, MD   1 tablet at 08/05/14 1029  . cefTRIAXone (ROCEPHIN) 1 g in dextrose 5 % 50 mL IVPB  1 g Intravenous Once Larene Pickett, PA-C   1 g at 08/16/2014 1415  . cefTRIAXone (ROCEPHIN) 1 g in dextrose 5 % 50 mL IVPB  1 g Intravenous Q24H Bonnielee Haff, MD   1 g at  08/04/14 1828  . [START ON 08/06/2014] famotidine (PEPCID) tablet 40 mg  40 mg Oral QHS Michael Boston, MD      . FLUoxetine (PROZAC) capsule 20 mg  20 mg Oral Daily Bonnielee Haff, MD   20 mg at 24/46/28 6381  . folic acid (FOLVITE) tablet 1 mg  1 mg Oral BID Bonnielee Haff, MD   1 mg at 08/05/14 1029  . heparin injection 5,000 Units  5,000 Units Subcutaneous 3 times per day Bonnielee Haff, MD   5,000 Units at 08/05/14 1343  . hydroxychloroquine (PLAQUENIL) tablet 200 mg  200 mg Oral BID Bonnielee Haff, MD   200 mg at 08/05/14 1030  . insulin aspart (novoLOG) injection 0-15 Units  0-15 Units Subcutaneous TID WC Bonnielee Haff, MD   3 Units at 08/05/14 1344  . insulin glargine (LANTUS) injection 8 Units  8 Units Subcutaneous Daily Bonnielee Haff, MD   8 Units at 08/05/14 1028  . ketorolac (TORADOL) 15 MG/ML injection 15 mg  15 mg Intravenous Q6H PRN Nishant Dhungel, MD      . lip balm (CARMEX) ointment 1 application  1 application Topical BID Michael Boston, MD      . LORazepam (ATIVAN) tablet 0.5 mg  0.5 mg Oral QHS Bonnielee Haff, MD   0.5 mg at 08/04/14 2253  . magic mouthwash  15 mL Oral QID PRN Michael Boston, MD      . morphine CONCENTRATE 10 MG/0.5ML oral solution 5-10 mg  5-10 mg Oral Q2H PRN Bonnielee Haff, MD   10 mg at 08/05/14 1051  . ondansetron (ZOFRAN) tablet 4 mg  4 mg Oral Q6H PRN Bonnielee Haff, MD       Or  . ondansetron (ZOFRAN) injection 4 mg  4 mg Intravenous Q6H PRN Bonnielee Haff, MD   4 mg at 08/04/14 1425  . pantoprazole (PROTONIX) EC tablet 40 mg  40 mg Oral BID Michael Boston, MD      . polyethylene glycol (MIRALAX / GLYCOLAX) packet 17 g  17 g Oral BID Michael Boston, MD      . predniSONE (DELTASONE) tablet 20 mg  20 mg Oral Q breakfast Bonnielee Haff, MD   20 mg at 08/05/14 0813  . promethazine (PHENERGAN) injection 6.25-12.5 mg  6.25-12.5 mg Intravenous Q4H PRN Michael Boston, MD      . saccharomyces boulardii (FLORASTOR) capsule 250 mg  250 mg Oral BID Michael Boston, MD      .  sodium chloride 0.9 % injection 3 mL  3 mL Intravenous Q12H Bonnielee Haff, MD   3 mL at 08/05/14 1000     Allergies  Allergen Reactions  . Acetaminophen     Ineffective.   . Adalimumab     "Sinking spells."  .  Allegra [Fexofenadine]     "Serum sickness."   . Allopurinol     "Serum sickness."   . Boniva [Ibandronic Acid]     Oral tablet causes chest pain. IV Boniva OK.    . Ciprofloxacin     "Pains in legs that may have been some mild tendonitis. She could maybe take it again."   . Codeine     Auditory and visual hallucinations.   . Dextromethorphan Nausea And Vomiting  . Enbrel [Etanercept]     "Sinking spells."  . Epinephrine     Unknown.    . Estrogens     "Felt sick."   . Forteo [Teriparatide (Recombinant)]     "Felt weird."   . Gabapentin     Unknown.    . Infliximab     Unknown.    [place] [Levofloxacin]     "Bilateral inflamed achilles tendons."   . Levemir [Insulin Detemir] Itching  . Meperidine Hcl     Unknown.   . Methocarbamol     Unknown.   . Methotrexate     Unknown.    . Other     "Eye dilating medicine." "Makes her 'goofy.'"   . Penicillins     Unknown.   Marland Kitchen Pentazocine Lactate     Unknown.    . Quinine Derivatives Diarrhea and Nausea And Vomiting  . Qvar [Beclomethasone]     "Made throat hurt worse and maybe made her GERD worse so stopped after 4 days."  . Risedronate Sodium     Tolerates IV boniva  . Robitussin (Alcohol Free) [Guaifenesin]     "Made sick."   . Sulfonamide Derivatives     Unknown.   . Tetracycline     Unknown.    . Tramadol Other (See Comments)    Hallucinations   . Doxycycline Diarrhea  . Metronidazole Rash  . Vitamin D2 [Ergocalciferol] Rash    GI upset.     ROS: Constitutional:  No fevers, chills, sweats.  Weight stable Eyes:  No vision changes, No discharge HENT:  No sore throats, nasal drainage Lymph: No neck swelling, No bruising easily Pulmonary:  Chronic cough - improving, No productive sputum now CV:  No orthopnea, PND  No exertional chest/neck/shoulder/arm pain. GI: No personal nor family history of GI/colon cancer, inflammatory bowel disease, irritable bowel syndrome, allergy such as Celiac Sprue, dietary/dairy problems, colitis, ulcers nor gastritis.  No recent sick contacts/gastroenteritis.  No travel outside the country.  No changes in diet. Renal: No UTIs, No hematuria Genital:  No drainage, bleeding, masses Musculoskeletal: Moderate joint pain.  Good ROM major joints Skin:  No sores or lesions.  No rashes Heme/Lymph:  No easy bleeding.  No swollen lymph nodes Neuro: No focal weakness/numbness.  No seizures Psych: No suicidal ideation.  No hallucinations  BP 143/71 mmHg  Pulse 89  Temp(Src) 98.6 F (37 C) (Axillary)  Resp 20  Ht _0  (1.626 m)  Wt 140 lb 6.9 oz (63.7 kg)  BMI 24.09 kg/m2  SpO2 98%  Physical Exam: General: Pt awake/alert/oriented x4 in no-moderate acute distress Eyes: PERRL, normal EOM. Sclera nonicteric.  No nystagmus. Neuro: CN II-XII intact w/o focal sensory/motor deficits.  No dysarthria.  No slurred speech.  Moving all joints. Lymph: No head/neck/groin lymphadenopathy Psych:  No delerium/psychosis/paranoia.  Moaning when laying restless.  Then call him.  A little sleepy after receiving nausea medication.  Good immediate and short-term and long-term memory.   HENT: Normocephalic, Mucus membranes moist.  No thrush.  7 mm oblique superficial laceration above right zygomatic arch.  Treated with Steri-Strip without difficulty.  Ecchymosis along right orbit not severe. Neck: Supple, No tracheal deviation Chest: No focal pain.  Good respiratory excursion. CV:  Pulses intact.  Regular rhythm Abdomen: Soft, Nondistended.  Mild vague soreness but no guarding.  No rebound tenderness.  No Murphy sign.  No pain over Murphy's point.  No umbilical incisional or inguinal hernias Ext:  SCDs BLE.  No significant edema.  No cyanosis Skin: No petechiae / purpurea.  No major  sores.  Lac/ecchymosis per HEENT section Musculoskeletal: Mild joint pain.  Good ROM major joints.  Some rheumatoid changes to hands and limited finger contractures but handgrip okay   Results:   Labs: Results for orders placed or performed during the hospital encounter of 08/21/2014 (from the past 48 hour(s))  Urinalysis, Routine w reflex microscopic     Status: Abnormal   Collection Time: 08/24/2014  2:51 PM  Result Value Ref Range   Color, Urine YELLOW YELLOW   APPearance CLEAR CLEAR   Specific Gravity, Urine 1.002 (L) 1.005 - 1.030   pH 7.0 5.0 - 8.0   Glucose, UA NEGATIVE NEGATIVE mg/dL   Hgb urine dipstick NEGATIVE NEGATIVE   Bilirubin Urine NEGATIVE NEGATIVE   Ketones, ur NEGATIVE NEGATIVE mg/dL   Protein, ur NEGATIVE NEGATIVE mg/dL   Urobilinogen, UA 1.0 0.0 - 1.0 mg/dL   Nitrite NEGATIVE NEGATIVE   Leukocytes, UA NEGATIVE NEGATIVE    Comment: MICROSCOPIC NOT DONE ON URINES WITH NEGATIVE PROTEIN, BLOOD, LEUKOCYTES, NITRITE, OR GLUCOSE <1000 mg/dL.  Glucose, capillary     Status: Abnormal   Collection Time: 08/13/2014  4:53 PM  Result Value Ref Range   Glucose-Capillary 189 (H) 70 - 99 mg/dL  HIV antibody     Status: None   Collection Time: 08/19/2014  9:00 PM  Result Value Ref Range   HIV 1&2 Ab, 4th Generation NONREACTIVE NONREACTIVE    Comment: (NOTE) A NONREACTIVE HIV Ag/Ab result does not exclude HIV infection since the time frame for seroconversion is variable. If acute HIV infection is suspected, a HIV-1 RNA Qualitative TMA test is recommended. HIV-1/2 Antibody Diff         Not indicated. HIV-1 RNA, Qual TMA           Not indicated. PLEASE NOTE: This information has been disclosed to you from records whose confidentiality may be protected by state law. If your state requires such protection, then the state law prohibits you from making any further disclosure of the information without the specific written consent of the person to whom it pertains, or as otherwise  permitted by law. A general authorization for the release of medical or other information is NOT sufficient for this purpose. The performance of this assay has not been clinically validated in patients less than 53 years old. Performed at [facility]   Troponin I (q 6hr x 3)     Status: None   Collection Time: 08/17/2014  9:00 PM  Result Value Ref Range   Troponin I <0.30 <0.30 ng/mL    Comment:        Due to the release kinetics of cTnI, a negative result within the first hours of the onset of symptoms does not rule out myocardial infarction with certainty. If myocardial infarction is still suspected, repeat the test at appropriate intervals.   Glucose, capillary     Status: None   Collection Time: 08/27/2014  9:50 PM  Result Value  Ref Range   Glucose-Capillary 88 70 - 99 mg/dL   Comment 1 Notify RN   Troponin I (q 6hr x 3)     Status: None   Collection Time: 08/04/14  3:00 AM  Result Value Ref Range   Troponin I <0.30 <0.30 ng/mL    Comment:        Due to the release kinetics of cTnI, a negative result within the first hours of the onset of symptoms does not rule out myocardial infarction with certainty. If myocardial infarction is still suspected, repeat the test at appropriate intervals.   Comprehensive metabolic panel     Status: Abnormal   Collection Time: 08/04/14  3:00 AM  Result Value Ref Range   Sodium 131 (L) 137 - 147 mEq/L    Comment: DELTA CHECK NOTED REPEATED TO VERIFY    Potassium 4.5 3.7 - 5.3 mEq/L   Chloride 96 96 - 112 mEq/L   CO2 24 19 - 32 mEq/L   Glucose, Bld 151 (H) 70 - 99 mg/dL   BUN 10 6 - 23 mg/dL   Creatinine, Ser 0.67 0.50 - 1.10 mg/dL   Calcium 8.5 8.4 - 10.5 mg/dL   Total Protein 6.0 6.0 - 8.3 g/dL   Albumin 2.1 (L) 3.5 - 5.2 g/dL   AST 19 0 - 37 U/L   ALT 17 0 - 35 U/L   Alkaline Phosphatase 65 39 - 117 U/L   Total Bilirubin 0.3 0.3 - 1.2 mg/dL   GFR calc non Af Amer 82 (L) >90 mL/min   GFR calc Af Amer >90 >90 mL/min     Comment: (NOTE) The eGFR has been calculated using the CKD EPI equation. This calculation has not been validated in all clinical situations. eGFR's persistently <90 mL/min signify possible Chronic Kidney Disease.    Anion gap 11 5 - 15  CBC     Status: Abnormal   Collection Time: 08/04/14  3:00 AM  Result Value Ref Range   WBC 14.1 (H) 4.0 - 10.5 K/uL   RBC 3.87 3.87 - 5.11 MIL/uL   Hemoglobin 9.9 (L) 12.0 - 15.0 g/dL   HCT 30.6 (L) 36.0 - 46.0 %   MCV 79.1 78.0 - 100.0 fL   MCH 25.6 (L) 26.0 - 34.0 pg   MCHC 32.4 30.0 - 36.0 g/dL   RDW 14.1 11.5 - 15.5 %   Platelets 547 (H) 150 - 400 K/uL  Hemoglobin A1c     Status: Abnormal   Collection Time: 08/04/14  3:00 AM  Result Value Ref Range   Hgb A1c MFr Bld 7.2 (H) <5.7 %    Comment: (NOTE)                                                                       According to the ADA Clinical Practice Recommendations for 2011, when HbA1c is used as a screening test:  >=6.5%   Diagnostic of Diabetes Mellitus           (if abnormal result is confirmed) 5.7-6.4%   Increased risk of developing Diabetes Mellitus References:Diagnosis and Classification of Diabetes Mellitus,Diabetes BHAL,9379,02(IOXBD 1):S62-S69 and Standards of Medical Care in         Diabetes - 2011,Diabetes ZHGD,9242,68 (Suppl  1):S11-S61.    Mean Plasma Glucose 160 (H) <117 mg/dL    Comment: Performed at [facility]  Glucose, capillary     Status: Abnormal   Collection Time: 08/04/14  7:38 AM  Result Value Ref Range   Glucose-Capillary 137 (H) 70 - 99 mg/dL  Glucose, capillary     Status: Abnormal   Collection Time: 08/04/14 11:46 AM  Result Value Ref Range   Glucose-Capillary 203 (H) 70 - 99 mg/dL  Glucose, capillary     Status: Abnormal   Collection Time: 08/04/14  4:14 PM  Result Value Ref Range   Glucose-Capillary 253 (H) 70 - 99 mg/dL  Glucose, capillary     Status: None   Collection Time: 08/04/14 10:10 PM  Result Value Ref Range    Glucose-Capillary 98 70 - 99 mg/dL  Glucose, capillary     Status: Abnormal   Collection Time: 08/05/14  8:09 AM  Result Value Ref Range   Glucose-Capillary 160 (H) 70 - 99 mg/dL  CBC     Status: Abnormal   Collection Time: 08/05/14  9:09 AM  Result Value Ref Range   WBC 16.5 (H) 4.0 - 10.5 K/uL   RBC 3.93 3.87 - 5.11 MIL/uL   Hemoglobin 9.8 (L) 12.0 - 15.0 g/dL   HCT 31.1 (L) 36.0 - 46.0 %   MCV 79.1 78.0 - 100.0 fL   MCH 24.9 (L) 26.0 - 34.0 pg   MCHC 31.5 30.0 - 36.0 g/dL   RDW 14.1 11.5 - 15.5 %   Platelets 558 (H) 150 - 400 K/uL  Basic metabolic panel     Status: Abnormal   Collection Time: 08/05/14  9:09 AM  Result Value Ref Range   Sodium 134 (L) 137 - 147 mEq/L   Potassium 4.0 3.7 - 5.3 mEq/L   Chloride 97 96 - 112 mEq/L   CO2 26 19 - 32 mEq/L   Glucose, Bld 146 (H) 70 - 99 mg/dL   BUN 11 6 - 23 mg/dL   Creatinine, Ser 0.72 0.50 - 1.10 mg/dL   Calcium 8.7 8.4 - 10.5 mg/dL   GFR calc non Af Amer 80 (L) >90 mL/min   GFR calc Af Amer >90 >90 mL/min    Comment: (NOTE) The eGFR has been calculated using the CKD EPI equation. This calculation has not been validated in all clinical situations. eGFR's persistently <90 mL/min signify possible Chronic Kidney Disease.    Anion gap 11 5 - 15  Troponin I     Status: None   Collection Time: 08/05/14  9:09 AM  Result Value Ref Range   Troponin I <0.30 <0.30 ng/mL    Comment:        Due to the release kinetics of cTnI, a negative result within the first hours of the onset of symptoms does not rule out myocardial infarction with certainty. If myocardial infarction is still suspected, repeat the test at appropriate intervals.     Imaging / Studies: Dg Chest 2 View  08/05/2014   CLINICAL DATA:  Cough, weakness shortness of breath.  Pneumonia.  EXAM: CHEST  2 VIEW  COMPARISON:  Previous examinations, the most recent dated 07/02/2011.  FINDINGS: Normal sized heart. Moderate sized hiatal hernia. Interval patchy opacity in the  superior segments of both lower lobes and possibly in the right upper lobes. Interval small nodular density at the left lung apex. , right upper lobe and right middle lobe. Suggestion minimal bilateral pleural fluid on 1 of the lateral  views, not seen the other views. Mild thoracic and lumbar spine degenerative changes and old 10% lower lumbar spine superior endplate compression deformity.  IMPRESSION: 1. Bilateral multi lobar pneumonia. 2. Possible minimal bilateral pleural. 3. Moderate-sized hiatal hernia. 4. Possible small left apical lung nodule. Attention to this area on follow-up chest radiographs is recommended.   Electronically Signed   By: Enrique Sack M.D.   On: 08/26/2014 13:54   Dg Ribs Bilateral  08/05/2014   CLINICAL DATA:  Fall.  Rib pain.  EXAM: BILATERAL RIBS - 3+ VIEW  COMPARISON:  07/31/2014  FINDINGS: No fracture or other bone lesions are seen involving the ribs.  Bilateral pneumonia, stable from two days ago. No cardiomegaly ; stable aortic and hilar contours. Moderate hiatal hernia.  IMPRESSION: 1. No acute osseous findings. 2. Bilateral pneumonia. 3. Hiatal hernia.   Electronically Signed   By: Jorje Guild M.D.   On: 08/05/2014 11:59   Ct Head Wo Contrast  08/05/2014   CLINICAL DATA:  80 year old female status post fall (unwitnessed). Laceration to the right periorbital soft tissues. Initial encounter.  EXAM: CT HEAD WITHOUT CONTRAST  TECHNIQUE: Contiguous axial images were obtained from the base of the skull through the vertex without intravenous contrast.  COMPARISON:  Most recent prior CT scan of the head 07/09/2006  FINDINGS: Negative for acute intracranial hemorrhage, acute infarction, mass, mass effect, hydrocephalus or midline shift. Gray-white differentiation is preserved throughout. Similar pattern of the periventricular, subcortical and deep white matter hypoattenuation most consistent with the sequelae of longstanding chronic microvascular ischemic white matter disease. No  focal scalp hematoma or evidence of calvarial fracture. The bilateral globes and orbits are symmetric and unremarkable. Normal aeration of the mastoid air cells and visualized paranasal sinuses. Atherosclerotic calcifications present within both cavernous carotid arteries.  IMPRESSION: 1. No acute intracranial abnormality. 2. Stable pattern of chronic microvascular ischemic white matter disease.   Electronically Signed   By: Jacqulynn Cadet M.D.   On: 08/05/2014 06:53   Ct Abdomen Pelvis W Contrast  07/07/2014   CLINICAL DATA:  Severe epigastric abdominal pain and burning. Acute onset of symptoms. Initial encounter.  EXAM: CT ABDOMEN AND PELVIS WITH CONTRAST  TECHNIQUE: Multidetector CT imaging of the abdomen and pelvis was performed using the standard protocol following bolus administration of intravenous contrast.  CONTRAST:  66m OMNIPAQUE IOHEXOL 300 MG/ML  SOLN  COMPARISON:  CT of the abdomen and pelvis from 11/01/2010 abdominal ultrasound performed 07/02/2011  FINDINGS: The visualized lung bases are clear. A large paraesophageal hernia contains nearly the entirety of the stomach.  A few nonspecific hypodensities are noted within the left hepatic lobe. These appear grossly stable and likely reflect small hepatic cysts. The spleen is unremarkable in appearance. The gallbladder is within normal limits. The pancreas and adrenal glands are unremarkable.  The kidneys are unremarkable in appearance. There is no evidence of hydronephrosis. No renal or ureteral stones are seen. No perinephric stranding is appreciated.  No free fluid is identified. The small bowel is unremarkable in appearance. No acute vascular abnormalities are seen. Mild scattered calcification is noted along the abdominal aorta and branches, including at the origins of the celiac trunk and superior mesenteric artery, and along the proximal right renal artery.  The appendix is not visualized. The colon is partially filled with stool. Mild  scattered diverticulosis is noted along the sigmoid colon, without evidence of diverticulitis.  The bladder is mildly distended and grossly unremarkable. The uterus is unremarkable in appearance. The ovaries  are relatively symmetric. No suspicious adnexal masses are seen. No inguinal lymphadenopathy is seen.  No acute osseous abnormalities are identified. Multilevel vacuum phenomenon is noted along the lumbar spine. There is slight apparently chronic loss of height at the superior endplate of L3, new from 2012.  IMPRESSION: 1. Large paraesophageal hernia is grossly unchanged from 2012, containing nearly the entirety of the stomach. No evidence of obstruction. 2. Mild scattered calcification along the abdominal aorta and its branches, including at the origins of the celiac trunk and superior mesenteric artery, and along the proximal right renal artery. 3. Mild scattered diverticulosis along the sigmoid colon, without evidence of diverticulitis. 4. Few nonspecific hypodensities within the left hepatic lobe are grossly stable and likely reflect small hepatic cysts. 5. Slight apparently chronic loss of height at the superior endplate of L3, new from 2012.   Electronically Signed   By: Garald Balding M.D.   On: 07/07/2014 06:37    Medications / Allergies: per chart  Antibiotics: Anti-infectives    Start     Dose/Rate Route Frequency Ordered Stop   08/04/14 1400  azithromycin (ZITHROMAX) 500 mg in dextrose 5 % 250 mL IVPB     500 mg250 mL/hr over 60 Minutes Intravenous Every 24 hours 08/11/2014 1631 08/01/2014 1359   08/07/2014 2200  hydroxychloroquine (PLAQUENIL) tablet 200 mg     200 mg Oral 2 times daily 08/02/2014 1631     08/21/2014 1800  cefTRIAXone (ROCEPHIN) 1 g in dextrose 5 % 50 mL IVPB     1 g100 mL/hr over 30 Minutes Intravenous Every 24 hours 08/26/2014 1631 08/23/2014 1759   08/06/2014 1415  cefTRIAXone (ROCEPHIN) 1 g in dextrose 5 % 50 mL IVPB     1 g100 mL/hr over 30 Minutes Intravenous  Once 08/04/2014 1408      08/23/2014 1415  azithromycin (ZITHROMAX) 500 mg in dextrose 5 % 250 mL IVPB     500 mg250 mL/hr over 60 Minutes Intravenous  Once 08/26/2014 1408 08/11/2014 1543      Assessment  Patricia Mosley  80 y.o. female       Problem List:  Principal Problem:   CAP (community acquired pneumonia) Active Problems:   Rheumatoid arthritis   Myalgia and myositis   Chest pain   DM type 2 (diabetes mellitus, type 2)   Dehydration   Hyponatremia   Fall in inpatient room   Simple laceration of face   Traumatic fall with no strong evidence of neurological or cardiac etiology.  Small facial laceration treated with Steri-Strip.  Keep it simple.  Does not tolerate even removal of tape well  No evidence of severe neurological injury.  Large paraesophageal hiatal hernia but no definite absence evidence of obstruction/ulceration/GI bleed.  Chronic severe pain and fibromyalgia with fair coping mechanisms.  Plan:  -Steri-Strip should stay on for 5 days then removed.  Laceration should heal fine  -Serial neurological checks to make sure no other etiologies.  -Paraesophageal hiatal hernia of moderate size, but given her numerous health issues and poor coping mechanisms, I doubt that she would do well with such a surgery.  Patient would need repeat endoscopy, esophageal manometry, gastric emptying study at least to complete workup and see if this is an issue.  Her hiatal hernia could explain her pneumonia as it could have been a mini aspiration event.  Would increase Protonix to twice a day.  Would do double dose H2 blocker at bedtime.  Keep height of bed greater than 30.  See if that helps.  GI re-eval if still a concern.  However, she claims she eats fine and does not have constipation.  No severe episodes of retching.  She has had episodes of chest pain of noncardiac etiology.  Perhaps cause is the Hiatal hernia but since it has not changed in 3 years, hard to say.  Would hold off on anything more  aggressive at this admission.  -Consider palliative care consult for her numerous pain complaints to see if they can treat her NUMEROUS symptoms better in the absence of severe pathology.  Defer immunomodulation to medicine and neurology.  -VTE prophylaxis- SCDs, etc  -mobilize as tolerated to help recovery  This point we will follow as needed.  Please call if you have further concerns.    Adin Hector, M.D., F.A.C.S. Gastrointestinal and Minimally Invasive Surgery [facility] [address], [place], [place] [address] [phone] Main / Paging   08/05/2014  Note: Portions of this report may have been transcribed using voice recognition software. Every effort was made to ensure accuracy; however, inadvertent computerized transcription errors may be present.   Any transcriptional errors that result from this process are unintentional.

## 2014-08-05 NOTE — Progress Notes (Signed)
OT Cancellation Note  Patient Details Name: Patricia Mosley MRN: [id number] DOB: 05/29/1934   Cancelled Treatment:    Reason Eval/Treat Not Completed: Medical issues which prohibited therapy (pt will fall last night; attempted eval and pt falling asleep during conversation. Will attempt later date.)  Rasheena Talmadge A 08/05/2014, 3:52 PM

## 2014-08-05 NOTE — Progress Notes (Signed)
08/05/14  1005  Pt complained of pain in her chest area. Pt states she fell on the tele box which has caused her CP. MD notified of the CP.

## 2014-08-05 NOTE — Progress Notes (Signed)
   08/05/14 0335  What Happened  Was fall witnessed? No  Was patient injured? Yes  Patient found other (Comment) (in bed)  Found by No one-pt stated they fell (pt called nursing station)  Stated prior activity bathroom-unassisted  Follow Up  MD notified Elray Mcgregor, NP  Time MD notified [phone]  Family notified Yes-comment  Time family notified 0400  Additional tests Yes-comment  Simple treatment Dressing  Progress note created (see row info) Yes  Adult Fall Risk Assessment  Risk Factor Category (scoring not indicated) Fall has occurred during this admission (document High fall risk)  Patient's Fall Risk High Fall Risk (>13 points)  Adult Fall Risk Interventions  Required Bundle Interventions *See Row Information* High fall risk - low, moderate, and high requirements implemented  Vitals  Temp 98 F (36.7 C)  Temp Source Oral  BP (!) 160/85 mmHg  BP Location Left Arm  BP Method Automatic  Patient Position (if appropriate) Lying  Pulse Rate (!) 109  Pulse Rate Source Dinamap  Oxygen Therapy  SpO2 93 %  O2 Device Nasal Cannula  O2 Flow Rate (L/min) 2 L/min

## 2014-08-06 ENCOUNTER — Inpatient Hospital Stay (HOSPITAL_COMMUNITY): Payer: Medicare Other

## 2014-08-06 ENCOUNTER — Encounter (HOSPITAL_COMMUNITY): Payer: Self-pay | Admitting: Radiology

## 2014-08-06 DIAGNOSIS — R072 Precordial pain: Secondary | ICD-10-CM

## 2014-08-06 DIAGNOSIS — J9601 Acute respiratory failure with hypoxia: Secondary | ICD-10-CM

## 2014-08-06 LAB — BLOOD GAS, ARTERIAL
ACID-BASE EXCESS: 1.1 mmol/L (ref 0.0–2.0)
BICARBONATE: 25.5 meq/L — AB (ref 20.0–24.0)
DRAWN BY: 295031
FIO2: 0.55 %
O2 CONTENT: 10 L/min
O2 SAT: 84.2 %
PCO2 ART: 42 mmHg (ref 35.0–45.0)
Patient temperature: 98.6
TCO2: 23.6 mmol/L (ref 0–100)
pH, Arterial: 7.4 (ref 7.350–7.450)
pO2, Arterial: 50.8 mmHg — ABNORMAL LOW (ref 80.0–100.0)

## 2014-08-06 LAB — GLUCOSE, CAPILLARY
Glucose-Capillary: 136 mg/dL — ABNORMAL HIGH (ref 70–99)
Glucose-Capillary: 188 mg/dL — ABNORMAL HIGH (ref 70–99)
Glucose-Capillary: 134 mg/dL — ABNORMAL HIGH (ref 70–99)

## 2014-08-06 LAB — CBC
HCT: 29.8 % — ABNORMAL LOW (ref 36.0–46.0)
Hemoglobin: 9.5 g/dL — ABNORMAL LOW (ref 12.0–15.0)
MCH: 25.3 pg — ABNORMAL LOW (ref 26.0–34.0)
MCHC: 31.9 g/dL (ref 30.0–36.0)
MCV: 79.3 fL (ref 78.0–100.0)
PLATELETS: 571 10*3/uL — AB (ref 150–400)
RBC: 3.76 MIL/uL — ABNORMAL LOW (ref 3.87–5.11)
RDW: 14 % (ref 11.5–15.5)
WBC: 18.7 10*3/uL — AB (ref 4.0–10.5)

## 2014-08-06 MED ORDER — MORPHINE SULFATE 2 MG/ML IJ SOLN
2.0000 mg | INTRAMUSCULAR | Status: DC | PRN
  Administered 2014-08-06: 2 mg via INTRAVENOUS
  Filled 2014-08-06: qty 1

## 2014-08-06 MED ORDER — LORAZEPAM 2 MG/ML IJ SOLN
2.0000 mg | Freq: Once | INTRAMUSCULAR | Status: AC
  Administered 2014-08-06: 2 mg via INTRAVENOUS
  Filled 2014-08-06: qty 1

## 2014-08-06 MED ORDER — VANCOMYCIN HCL 500 MG IV SOLR
500.0000 mg | Freq: Two times a day (BID) | INTRAVENOUS | Status: DC
  Administered 2014-08-07 – 2014-08-08 (×4): 500 mg via INTRAVENOUS
  Filled 2014-08-06 (×4): qty 500

## 2014-08-06 MED ORDER — HYDROCORTISONE NA SUCCINATE PF 100 MG IJ SOLR
100.0000 mg | Freq: Three times a day (TID) | INTRAMUSCULAR | Status: DC
  Administered 2014-08-06 – 2014-08-07 (×2): 100 mg via INTRAVENOUS
  Filled 2014-08-06 (×2): qty 2

## 2014-08-06 MED ORDER — IOHEXOL 350 MG/ML SOLN
100.0000 mL | Freq: Once | INTRAVENOUS | Status: AC | PRN
  Administered 2014-08-06: 100 mL via INTRAVENOUS

## 2014-08-06 MED ORDER — POLYETHYLENE GLYCOL 3350 17 G PO PACK: 17.0000 g | PACK | Freq: Two times a day (BID) | ORAL | Status: DC | PRN

## 2014-08-06 MED ORDER — DEXTROSE 5 % IV SOLN
1.0000 g | Freq: Three times a day (TID) | INTRAVENOUS | Status: DC
  Administered 2014-08-06 – 2014-08-11 (×14): 1 g via INTRAVENOUS
  Filled 2014-08-06 (×17): qty 1

## 2014-08-06 MED ORDER — SENNOSIDES-DOCUSATE SODIUM 8.6-50 MG PO TABS
2.0000 | ORAL_TABLET | Freq: Two times a day (BID) | ORAL | Status: DC
  Administered 2014-08-07 – 2014-08-11 (×6): 2 via ORAL
  Filled 2014-08-06: qty 2
  Filled 2014-08-06: qty 1
  Filled 2014-08-06 (×3): qty 2
  Filled 2014-08-06: qty 1
  Filled 2014-08-06: qty 2

## 2014-08-06 MED ORDER — MAGIC MOUTHWASH
15.0000 mL | Freq: Three times a day (TID) | ORAL | Status: DC
  Administered 2014-08-07 – 2014-08-11 (×15): 15 mL via ORAL
  Filled 2014-08-06 (×14): qty 15

## 2014-08-06 MED ORDER — LORAZEPAM 2 MG/ML IJ SOLN
1.0000 mg | Freq: Every day | INTRAMUSCULAR | Status: DC
  Administered 2014-08-06: 1 mg via INTRAVENOUS
  Filled 2014-08-06: qty 1

## 2014-08-06 MED ORDER — FENTANYL CITRATE 0.05 MG/ML IJ SOLN
25.0000 ug | Freq: Once | INTRAMUSCULAR | Status: AC
  Administered 2014-08-06: 25 ug via INTRAVENOUS
  Filled 2014-08-06: qty 2

## 2014-08-06 MED ORDER — LORAZEPAM 2 MG/ML IJ SOLN: 1.0000 mg | INTRAMUSCULAR | Status: DC | PRN

## 2014-08-06 MED ORDER — VANCOMYCIN HCL IN DEXTROSE 1-5 GM/200ML-% IV SOLN
1000.0000 mg | Freq: Once | INTRAVENOUS | Status: AC
  Administered 2014-08-06: 1000 mg via INTRAVENOUS
  Filled 2014-08-06: qty 200

## 2014-08-06 MED ORDER — MORPHINE SULFATE 2 MG/ML IJ SOLN
2.0000 mg | INTRAMUSCULAR | Status: DC | PRN
  Administered 2014-08-07: 2 mg via INTRAVENOUS
  Filled 2014-08-06: qty 1

## 2014-08-06 NOTE — Progress Notes (Addendum)
[facility] PROGRESS NOTE  Patricia Mosley WUJ:[id number] DOB: [birth year] DOA: 08/28/2014 PCP: Ezequiel Kayser, MD  Brief Narrative 80 year old female with history of type 2 diabetes mellitus, rheumatoid arthritis on chronic steroid, fibromyalgia on morphine at home presented with generalized weakness for past 1 week associated with dry cough. Chest x-ray done as outpatient showed pneumonia and was started on Z-Pak however symptoms did not improve and patient came to the emergency department. A chest x-ray done in the ED showed bilateral multilobar pneumonia as well as hiatal hernia. Also showed small left apical lung nodule. She  was also found to have hyponatremia. Patient admitted for community-acquired pneumonia.  Assessment/Plan:  Acute hypoxic respiratory failure on 11/8  pt hypoxic to 80s on 5L [place]. Also moaning with episodic chest pain. ABG showed hypoxemia. CXR repeated showing increased right and LUL airspace ds. Will check CT angiogram chest to r/o PE. Persistent / worsening PNA despite abx therapy concerning for aspiration. Ordered Swallow eval. -limited choice to escalate abx given resistance to multiple abx ( PCN, quinolones, tetracyclines)  transfer to stepdown if symptoms worsen.   Community-acquired pneumonia -empiric Rocephin and azithromycin.  urine for strep antigen and Legionella antigen not sent . Blood culture not sent on admission. -supportive care with IV fluids, antitussives and Tylenol.  Hyponatremia Possibly hypovolemic as patient appeared quite dehydrated on admission.  improved with IV fluids  Chronic pain/ fibromyalgia/ chest pain  patient has hx of fibromyalgia, RA and chr costochondritis. Possible worsening from underlying illness vs pleurisy. She also had a fall at bedside on 11/7 and hit her chest on the telemetry box. She sustained laceration over left orbital area. Head CT unremarkable. Xray of ribs negative for fracture. As per husband she has  prescriptions ofr morphine at home but does not use it frequently. palliative care consulted to help with her pain management.  Fall with laceration to right orbit continue sterri strip. No need for sutures for surgery    Leukocytosis Possibly from worsened PNA. She is also on chr low dose prednsione  Chronic rheumatoid arthritis Continue prednisone an plaquenil  GERD with hiatal hernia Continue Protonix and Pepcid. Dose increased as per surgery recommendations   Type 2 diabetes mellitus A1c of 7.2. continue home  dose Lantus and sliding scale insulin   Chronic abdominal Lower extremity pain History of chronic fibromyalgia and is on frequent morphine at home which is been continued.  Generalized weakness and protein calorie malnutrition Physical therapy and nutrition consult requested.    DVT prophylaxis: Subcutaneous heparin  Diet: Diabetic    Code Status: full code Family Communication: husband at bedside Disposition Plan: home once stable   Consultants:  [facility]  Procedures:  none  Antibiotics:  Rocephin and azithromycin 11/5--  HPI/Subjective: Patient seen and examined. Pt hypoxic on [place] and transition to face mask ( sats ranging from 89-93%). C/o chest pain periodically. ABG shows hypoxemia. Given fentanly x 1 and IV ativan 2 mg IV after which she was calm and sleepy. repat CXR shows worsening PNA vs asymmetric edema.   Objective: Filed Vitals:   08/06/14 0640  BP: 118/59  Pulse: 83  Temp: 98.1 F (36.7 C)  Resp: 22    Intake/Output Summary (Last 24 hours) at 08/06/14 1112 Last data filed at 08/05/14 1801  Gross per 24 hour  Intake    370 ml  Output      0 ml  Net    370 ml   Filed Weights   08/04/14 0529  Weight: 63.7 kg (140 lb 6.9 oz)    Exam:   General:  Elderly thin built female lying in bed crying in pain  HEENT: Laceration over right infraorbital area appears clean  Chest: Tender to pressure over right side of the  chest, diminished  breath sounds bilaterally,  no added sounds  CVS: Normal S1 and S2, no murmurs  Abdomen: Soft, nontender, nondistended, bowel sounds present  extremities: Warm, no edema  CNS: sleepy but arousable  Data Reviewed: Basic Metabolic Panel:  Recent Labs Lab 08/05/2014 1326 08/04/14 0300 08/05/14 0909  NA 124* 131* 134*  K 4.2 4.5 4.0  CL 91* 96 97  CO2 20 24 26   GLUCOSE 177* 151* 146*  BUN 9 10 11   CREATININE 0.67 0.67 0.72  CALCIUM 8.3* 8.5 8.7   Liver Function Tests:  Recent Labs Lab 08/04/14 0300  AST 19  ALT 17  ALKPHOS 65  BILITOT 0.3  PROT 6.0  ALBUMIN 2.1*   No results for input(s): LIPASE, AMYLASE in the last 168 hours. No results for input(s): AMMONIA in the last 168 hours. CBC:  Recent Labs Lab 08/17/2014 1326 08/04/14 0300 08/05/14 0909 08/06/14 0525  WBC 15.3* 14.1* 16.5* 18.7*  NEUTROABS 13.5*  --   --   --   HGB 10.2* 9.9* 9.8* 9.5*  HCT 31.7* 30.6* 31.1* 29.8*  MCV 79.4 79.1 79.1 79.3  PLT 525* 547* 558* 571*   Cardiac Enzymes:  Recent Labs Lab 07/30/2014 2100 08/04/14 0300 08/05/14 0909  TROPONINI <0.30 <0.30 <0.30   BNP (last 3 results) No results for input(s): PROBNP in the last 8760 hours. CBG:  Recent Labs Lab 08/04/14 2210 08/05/14 0809 08/05/14 1301 08/05/14 1633 08/05/14 2110  GLUCAP 98 160* 176* 269* 104*    No results found for this or any previous visit (from the past 240 hour(s)).   Studies: Dg Ribs Bilateral  08/05/2014   CLINICAL DATA:  Fall.  Rib pain.  EXAM: BILATERAL RIBS - 3+ VIEW  COMPARISON:  08/02/2014  FINDINGS: No fracture or other bone lesions are seen involving the ribs.  Bilateral pneumonia, stable from two days ago. No cardiomegaly ; stable aortic and hilar contours. Moderate hiatal hernia.  IMPRESSION: 1. No acute osseous findings. 2. Bilateral pneumonia. 3. Hiatal hernia.   Electronically Signed   By: [birth year]1970 M.D.   On: 08/05/2014 11:59   Ct Head Wo Contrast  08/05/2014    CLINICAL DATA:  80 year old female status post fall (unwitnessed). Laceration to the right periorbital soft tissues. Initial encounter.  EXAM: CT HEAD WITHOUT CONTRAST  TECHNIQUE: Contiguous axial images were obtained from the base of the skull through the vertex without intravenous contrast.  COMPARISON:  Most recent prior CT scan of the head 07/09/2006  FINDINGS: Negative for acute intracranial hemorrhage, acute infarction, mass, mass effect, hydrocephalus or midline shift. Gray-white differentiation is preserved throughout. Similar pattern of the periventricular, subcortical and deep white matter hypoattenuation most consistent with the sequelae of longstanding chronic microvascular ischemic white matter disease. No focal scalp hematoma or evidence of calvarial fracture. The bilateral globes and orbits are symmetric and unremarkable. Normal aeration of the mastoid air cells and visualized paranasal sinuses. Atherosclerotic calcifications present within both cavernous carotid arteries.  IMPRESSION: 1. No acute intracranial abnormality. 2. Stable pattern of chronic microvascular ischemic white matter disease.   Electronically Signed   By: 70 M.D.   On: 08/05/2014 06:53   Dg Chest Port 1 View  08/06/2014   CLINICAL  DATA:  Shortness of breath, hypoxia  EXAM: PORTABLE CHEST - 1 VIEW  COMPARISON:  08/25/2014 and 08/05/2014  FINDINGS: Increased ill-defined right mid lung zone and left upper lobe patchy airspace opacity. Large hiatal hernia. Heart size is mildly enlarged.  IMPRESSION: Increased right mid lung zone and left upper lobe airspace consolidation. This could represent worsening pneumonia, asymmetric edema, or other alveolar filling processes.   Electronically Signed   By: Christiana Pellant M.D.   On: 08/06/2014 10:01    Scheduled Meds: . azithromycin  500 mg Intravenous Q24H  . calcium-vitamin D  1 tablet Oral Daily  . cefTRIAXone (ROCEPHIN)  IV  1 g Intravenous Once  . cefTRIAXone  (ROCEPHIN)  IV  1 g Intravenous Q24H  . famotidine  40 mg Oral QHS  . FLUoxetine  20 mg Oral Daily  . folic acid  1 mg Oral BID  . heparin  5,000 Units Subcutaneous 3 times per day  . hydroxychloroquine  200 mg Oral BID  . insulin aspart  0-15 Units Subcutaneous TID WC  . insulin glargine  8 Units Subcutaneous Daily  . lip balm  1 application Topical BID  . LORazepam  0.5 mg Oral QHS  . pantoprazole  40 mg Oral BID  . polyethylene glycol  17 g Oral BID  . predniSONE  20 mg Oral Q breakfast  . saccharomyces boulardii  250 mg Oral BID  . sodium chloride  3 mL Intravenous Q12H   Continuous Infusions:     Time spent: 35 minutes    Sibley Rolison  [facility] Pager [phone]. If 7PM-7AM, please contact night-coverage at [email or web address], password [facility] 08/06/2014, 11:12 AM  LOS: 3 days

## 2014-08-06 NOTE — Consult Note (Signed)
Palliative Medicine Team at [facility]  Date: 08/06/2014   Patient Name: Patricia Mosley  DOB: 1934/06/18  MRN: [id number]  Age / Sex: 80 y.o., female   PCP: Patricia Kayser, MD Referring Physician: Eddie North, MD  Active Problems: Principal Problem:   CAP (community acquired pneumonia) Active Problems:   Rheumatoid arthritis   Myalgia and myositis   Chest pain   DM type 2 (diabetes mellitus, type 2)   Dehydration   Hyponatremia   Fall in inpatient room   Simple laceration of face   HPI/Reason for Consultation: Patricia Mosley is a 80 y.o. female with RA, fibromyalgia, depression, Psoriatic arthritis on chronic prednisone and recent functional status declineadmitted with acute respirtory failure and found to have multilobar PNA. Her husband gives a history of 2 weeks of a flu-like illness with severe fatigue, myalgias, chest and nasal congestion and poor appetite and was found to have PNA and started on outpatient treatment by Patricia Mosley with Azithromycin but failed to improve so her husband brought her to teh ER for evaluation. PMT was consulted because Patricia Mosley has had severe intractable pain since admission, has been crying out, tearful and fell yesterday morning trying to get OOB without assistance. She has since decompensated with complaints of severe chest pain, hypoxia, worsening chest CXR and she is almost unresponsive, and has profound allodynia.   Her husband tells me that for about the past year he has noticed a decline-she rarely goes out of the house- she used to clean and prepare meals but is really no longer able to do this and he feels that it is from depression and also from her chronic pain but reports that all the patient can tell him is that she "just feels bad". She sees a psychiatrist, a rheumatologist and they have an excellent relationship with their PCP Patricia Mosley.  Participants in Discussion: Yes, husband. He is present at bedside- she has a son Patricia Mosley  who is an Radio producer in [place], [place] and another son who has a terminally ill wife.    Advance Directive: Yes, husband does not want to bring this in -the reason is unclear- he is afraid it will impact the care given to her and that it is a sign of him "giving up" on her   Code Status Orders        Start     Ordered   07/31/2014 1632  Full code   Continuous     08/22/2014 1631     I have reviewed the medical record, interviewed the patient and family, and examined the patient. The following aspects are pertinent.  Past Medical History  Diagnosis Date  . Diabetes mellitus     "prednisone induced" per patient  . Osteoporosis   . Rheumatoid arthritis(714.0)   . Fibromyalgia    History   Social History  . Marital Status: Married    Spouse Name: N/A    Number of Children: N/A  . Years of Education: N/A   Social History Main Topics  . Smoking status: Never Smoker   . Smokeless tobacco: Never Used  . Alcohol Use: No  . Drug Use: No  . Sexual Activity: None   Other Topics Concern  . None   Social History Narrative   Family History  Problem Relation Age of Onset  . Heart disease Mother    Scheduled Meds: . calcium-vitamin D  1 tablet Oral Daily  . ceFEPime (MAXIPIME) IV  1 g Intravenous 3 times per  day  . famotidine  40 mg Oral QHS  . FLUoxetine  20 mg Oral Daily  . folic acid  1 mg Oral BID  . heparin  5,000 Units Subcutaneous 3 times per day  . hydrocortisone sod succinate (SOLU-CORTEF) inj  100 mg Intravenous 3 times per day  . hydroxychloroquine  200 mg Oral BID  . insulin aspart  0-15 Units Subcutaneous TID WC  . insulin glargine  8 Units Subcutaneous Daily  . lip balm  1 application Topical BID  . LORazepam  1 mg Intravenous QHS  . magic mouthwash  15 mL Oral TID  . pantoprazole  40 mg Oral BID  . saccharomyces boulardii  250 mg Oral BID  . senna-docusate  2 tablet Oral BID  . sodium chloride  3 mL Intravenous Q12H  . [START ON 08/07/2014] vancomycin   500 mg Intravenous Q12H   Continuous Infusions:  PRN Meds:.albuterol, alum & mag hydroxide-simeth, bisacodyl, ketorolac, LORazepam, morphine injection, ondansetron **OR** ondansetron (ZOFRAN) IV, polyethylene glycol, promethazine Allergies  Allergen Reactions  . Acetaminophen     Ineffective.   . Adalimumab     "Sinking spells."  . Allegra [Fexofenadine]     "Serum sickness."   . Allopurinol     "Serum sickness."   . Boniva [Ibandronic Acid]     Oral tablet causes chest pain. IV Boniva OK.    . Ciprofloxacin     "Pains in legs that may have been some mild tendonitis. She could maybe take it again."   . Codeine     Auditory and visual hallucinations.   . Dextromethorphan Nausea And Vomiting  . Enbrel [Etanercept]     "Sinking spells."  . Epinephrine     Unknown.    . Estrogens     "Felt sick."   . Forteo [Teriparatide (Recombinant)]     "Felt weird."   . Gabapentin     Unknown.    . Infliximab     Unknown.    Barbera Setters [Levofloxacin]     "Bilateral inflamed achilles tendons."   . Levemir [Insulin Detemir] Itching  . Meperidine Hcl     Unknown.   . Methocarbamol     Unknown.   . Methotrexate     Unknown.    . Other     "Eye dilating medicine." "Makes her 'goofy.'"   . Penicillins     Unknown.   Marland Kitchen Pentazocine Lactate     Unknown.    . Quinine Derivatives Diarrhea and Nausea And Vomiting  . Qvar [Beclomethasone]     "Made throat hurt worse and maybe made her GERD worse so stopped after 4 days."  . Risedronate Sodium     Tolerates IV boniva  . Robitussin (Alcohol Free) [Guaifenesin]     "Made sick."   . Sulfonamide Derivatives     Unknown.   . Tetracycline     Unknown.    . Tramadol Other (See Comments)    Hallucinations   . Doxycycline Diarrhea  . Metronidazole Rash  . Vitamin D2 [Ergocalciferol] Rash    GI upset.    CBC:    Component Value Date/Time   WBC 18.7* 08/06/2014 0525   HGB 9.5* 08/06/2014 0525   HCT 29.8* 08/06/2014 0525   PLT 571*  08/06/2014 0525   MCV 79.3 08/06/2014 0525   NEUTROABS 13.5* 08/14/2014 1326   LYMPHSABS 0.8 07/30/2014 1326   MONOABS 0.9 08/06/2014 1326   EOSABS 0.1 08/13/2014 1326   BASOSABS 0.0 08/22/2014 1326  Comprehensive Metabolic Panel:    Component Value Date/Time   NA 134* 08/05/2014 0909   K 4.0 08/05/2014 0909   CL 97 08/05/2014 0909   CO2 26 08/05/2014 0909   BUN 11 08/05/2014 0909   CREATININE 0.72 08/05/2014 0909   GLUCOSE 146* 08/05/2014 0909   CALCIUM 8.7 08/05/2014 0909   AST 19 08/04/2014 0300   ALT 17 08/04/2014 0300   ALKPHOS 65 08/04/2014 0300   BILITOT 0.3 08/04/2014 0300   PROT 6.0 08/04/2014 0300   ALBUMIN 2.1* 08/04/2014 0300    Vital Signs: BP 114/52 mmHg  Pulse 89  Temp(Src) 99.5 F (37.5 C) (Axillary)  Resp 19  Ht 5\' 4"  (1.626 m)  Wt 63.7 kg (140 lb 6.9 oz)  BMI 24.09 kg/m2  SpO2 99% Filed Weights   08/04/14 0529  Weight: 63.7 kg (140 lb 6.9 oz)   11/07 0701 - 11/08 0700 In: 370 [P.O.:120; IV Piggyback:250] Out: -   Physical Exam:  Frail, mostly unresponsive, only moans out in pain, many ecchymosis including over her left eye, chest bruising, extremity bruising. +tremors and agitation. Appears to be in moderate amount of distress, pain with gentle pressure anywhere on her body no obviously swollen joints.NRB on her face and tachypnea+, diffuse rhonchi.  Summary of Established Goals of Care and Medical Treatment Preferences PMT was consulted for predominantly pain management in a complex fibromyalgia/autoimmune disease patient who was having significant distress, however when I reviewed her record it appeared that this evening she began to have mental status changes and hypoxemic respiratory failure. When I got to her ICU room she was in obvious distress. I asked if Mr. Benevides was willing to talk with me- he was very worried when I told him I was from Palliative because he thought that was only for end of life care- he was quite suspicious about why I  was involved, but willing to engage with me- I provided education on our services and talked with him about what the past few years and months had been like for he and his wife. We talked about her decline and medical problems. It was clear to me that while Mr. Cucuzza was worried about his wife that he did not understand just how critically ill she was at the current time. I asked about a living will and he didn't understand why that needed to be on file at the hospital. When I gently approached the subject of her code status- he said "I cant believe we are having this conversation"- in general I would have not brought it up this early in our meeting but I was quite concerned that Ms. Peterman was headed for intubation soon- especially if I started adding opiates on for pain and suffering. He told me stories about how the patient's mother lived to be 46 years old and their son who is a doctor refused to make her a DNR and put her through painful unnecessary procedures because he didn't want to just "do nothing". And "give up" and Mr. Mckibbin didn't think his son would handle a similar conversation very well about his mother. We talked about how more medicine and more interventions doesn't always mean better care but may actually mean more risk and harm. I reassured Mr. Masri that we were very aggressively treating his wife and that this was a conversation about "what if" things got worse. He was simply not ready to discuss this and I think without [place] having time to establish rapport and trust  he was not going make any decisions today.  We discussed her pain control- Mr. Cheramie was hesitant to discuss her home use of morphine and continued to express his fear that someone may thing that she is "addicted" I provided reassurance that there was no concern about addiction but we just needed to know how she had been taking her pain medicine at home.There seemed to be lots of shame around this topic. She has many  non-specific allergies and sensitivities that are difficult to truly corroborate. Mr. Wever tells me that his wife has very low threshold for pain and has her whole life- he tells me about a time when she had an IUD placed that the OBGYN could not move forward with the procedure due to not being able to medicate her enough to sedate her and control her pain response.  NO specific goals of care could be established other than her husband is hoping for a full recovery back to theway things were before they came in thehospital- we talked about how difficult a serious illness is like this given her age and weakness to regain full strength and recovery and that she may need rehab and significant assistance if she survives her current situation.  I am very concerned that she is a full code- if inflating the blood pressure cuff causes her extreme agony- chest compressions in the event of an arrest would be devastating.  Assessment: Given Mrs. Bossler's current level of pain I suspect she is having some kind of severe autoimmune/rheumatic flare- she has extreme allodynia and is decompensating quickly. Her PNA is getting worse despite treatment.  Recommendations:  Stress dose steroids in the setting of acute illness  Broaden antibiotics because she is immune-compromised  Consider acute autoimmune dx work up  CT of chest pending to r/o PE, nurses noted blood is very coagulable when trying to place IV  IV Morphine 2-4mg  q2 prn for pain  IV ativan 1mg  q4prn agitation and a scheduled dose QHS  Consider adding on QHS seroquel for pain/anxiety/depression  Check a magnesium and ionized calcium level  Recommend getting in touch with Dr. who knows them well as their PCP for many years-Mr. Carano would probably very much value his insight and participation in his wife's care.   Time In: 430PM  Time Out: 6PM Time Total: 90 minutes Greater than 50%  of this time was spent counseling and  coordinating care related to the above assessment and plan.  Signed by: Jackquline Bosch, DO  08/06/2014, 11:35 PM  Please contact Palliative Medicine Team phone at [phone] for questions and concerns.

## 2014-08-06 NOTE — Progress Notes (Signed)
ANTIBIOTIC CONSULT NOTE - INITIAL  Pharmacy Consult for vancomycin/cefepime Indication: pneumonia  Allergies  Allergen Reactions  . Acetaminophen     Ineffective.   . Adalimumab     "Sinking spells."  . Allegra [Fexofenadine]     "Serum sickness."   . Allopurinol     "Serum sickness."   . Boniva [Ibandronic Acid]     Oral tablet causes chest pain. IV Boniva OK.    . Ciprofloxacin     "Pains in legs that may have been some mild tendonitis. She could maybe take it again."   . Codeine     Auditory and visual hallucinations.   . Dextromethorphan Nausea And Vomiting  . Enbrel [Etanercept]     "Sinking spells."  . Epinephrine     Unknown.    . Estrogens     "Felt sick."   . Forteo [Teriparatide (Recombinant)]     "Felt weird."   . Gabapentin     Unknown.    . Infliximab     Unknown.    Barbera Setters [Levofloxacin]     "Bilateral inflamed achilles tendons."   . Levemir [Insulin Detemir] Itching  . Meperidine Hcl     Unknown.   . Methocarbamol     Unknown.   . Methotrexate     Unknown.    . Other     "Eye dilating medicine." "Makes her 'goofy.'"   . Penicillins     Unknown.   Marland Kitchen Pentazocine Lactate     Unknown.    . Quinine Derivatives Diarrhea and Nausea And Vomiting  . Qvar [Beclomethasone]     "Made throat hurt worse and maybe made her GERD worse so stopped after 4 days."  . Risedronate Sodium     Tolerates IV boniva  . Robitussin (Alcohol Free) [Guaifenesin]     "Made sick."   . Sulfonamide Derivatives     Unknown.   . Tetracycline     Unknown.    . Tramadol Other (See Comments)    Hallucinations   . Doxycycline Diarrhea  . Metronidazole Rash  . Vitamin D2 [Ergocalciferol] Rash    GI upset.     Patient Measurements: Height: 5\' 4"  (162.6 cm) Weight: 140 lb 6.9 oz (63.7 kg) IBW/kg (Calculated) : 54.7 Adjusted Body Weight:   Vital Signs: Temp: 98.1 F (36.7 C) (11/08 0640) Temp Source: Oral (11/08 0640) BP: 120/71 mmHg (11/08 1354) Pulse Rate: 101  (11/08 1500) Intake/Output from previous day: 11/07 0701 - 11/08 0700 In: 370 [P.O.:120; IV Piggyback:250] Out: -  Intake/Output from this shift: Total I/O In: 472 [P.O.:222; IV Piggyback:250] Out: -   Labs:  Recent Labs  08/04/14 0300 08/05/14 0909 08/06/14 0525  WBC 14.1* 16.5* 18.7*  HGB 9.9* 9.8* 9.5*  PLT 547* 558* 571*  CREATININE 0.67 0.72  --    Estimated Creatinine Clearance: 50 mL/min (by C-G formula based on Cr of 0.72). No results for input(s): VANCOTROUGH, VANCOPEAK, VANCORANDOM, GENTTROUGH, GENTPEAK, GENTRANDOM, TOBRATROUGH, TOBRAPEAK, TOBRARND, AMIKACINPEAK, AMIKACINTROU, AMIKACIN in the last 72 hours.   Microbiology: No results found for this or any previous visit (from the past 720 hour(s)).  Medical History: Past Medical History  Diagnosis Date  . Diabetes mellitus     "prednisone induced" per patient  . Osteoporosis   . Rheumatoid arthritis(714.0)   . Fibromyalgia    Assessment: 89 YOF admitted 11/5 for weakness and cough, placed on ceftriaxone and azithromycin  for CAP.  She was transferred to ICU 11/8 for hypoxia. CXR  with worsening pneumonia. She has multiple drug allergies/intolerances, including PCN but has tolerated ceftriaxone.  Orders to change antibiotics to vancomycin and cefepime.    11/5 >> azithromycin  >> 11/8 11/5 >> ceftriaxone  >>  11/8 11/8 >>vancomycin  >> 11/8 >> cefepime >>    Tmax: afebrile WBCs: WBC elevated/worsening Renal: SCr WNL, normalized CrCl = 54ml/min (using Scr = 0.8)  No cultures  Goal of Therapy:  Vancomycin trough level 15-20 mcg/ml  Plan:   Vancomycin 1gm IV x 1 then 500mg  IV q12h  Check steady state trough  Monitor renal function  Cefepime 1gm IV q8h  , PharmD, BCPS.   Pager: Juliette Alcide  08/06/2014,5:47 PM

## 2014-08-07 ENCOUNTER — Inpatient Hospital Stay (HOSPITAL_COMMUNITY): Payer: Medicare Other

## 2014-08-07 DIAGNOSIS — E119 Type 2 diabetes mellitus without complications: Secondary | ICD-10-CM

## 2014-08-07 DIAGNOSIS — J80 Acute respiratory distress syndrome: Secondary | ICD-10-CM

## 2014-08-07 DIAGNOSIS — J9601 Acute respiratory failure with hypoxia: Secondary | ICD-10-CM | POA: Insufficient documentation

## 2014-08-07 LAB — BLOOD GAS, ARTERIAL
Acid-Base Excess: 1.2 mmol/L (ref 0.0–2.0)
Acid-base deficit: 0.5 mmol/L (ref 0.0–2.0)
BICARBONATE: 25.1 meq/L — AB (ref 20.0–24.0)
Bicarbonate: 23.9 mEq/L (ref 20.0–24.0)
DRAWN BY: 276051
Drawn by: 27605
FIO2: 1 %
FIO2: 1 %
MECHVT: 500 mL
O2 SAT: 99.2 %
O2 Saturation: 88.9 %
PCO2 ART: 40.4 mmHg (ref 35.0–45.0)
PEEP/CPAP: 8 cmH2O
PH ART: 7.421 (ref 7.350–7.450)
Patient temperature: 98.6
Patient temperature: 98.6
RATE: 22 resp/min
TCO2: 23.2 mmol/L (ref 0–100)
TCO2: 22.3 mmol/L (ref 0–100)
pCO2 arterial: 39.4 mmHg (ref 35.0–45.0)
pH, Arterial: 7.39 (ref 7.350–7.450)
pO2, Arterial: 56 mmHg — ABNORMAL LOW (ref 80.0–100.0)
pO2, Arterial: 178 mmHg — ABNORMAL HIGH (ref 80.0–100.0)

## 2014-08-07 LAB — BASIC METABOLIC PANEL
Anion gap: 14 (ref 5–15)
BUN: 14 mg/dL (ref 6–23)
CALCIUM: 9.5 mg/dL (ref 8.4–10.5)
CO2: 25 mEq/L (ref 19–32)
CREATININE: 0.64 mg/dL (ref 0.50–1.10)
Chloride: 90 mEq/L — ABNORMAL LOW (ref 96–112)
GFR calc non Af Amer: 83 mL/min — ABNORMAL LOW (ref >90)
GLUCOSE: 161 mg/dL — AB (ref 70–99)
POTASSIUM: 5.3 meq/L (ref 3.7–5.3)
Sodium: 129 mEq/L — ABNORMAL LOW (ref 137–147)

## 2014-08-07 LAB — CBC
HEMATOCRIT: 35 % — AB (ref 36.0–46.0)
HEMOGLOBIN: 11.3 g/dL — AB (ref 12.0–15.0)
MCH: 25.6 pg — AB (ref 26.0–34.0)
MCHC: 32.3 g/dL (ref 30.0–36.0)
MCV: 79.2 fL (ref 78.0–100.0)
Platelets: 438 10*3/uL — ABNORMAL HIGH (ref 150–400)
RBC: 4.42 MIL/uL (ref 3.87–5.11)
RDW: 14.2 % (ref 11.5–15.5)
WBC: 25.2 10*3/uL — ABNORMAL HIGH (ref 4.0–10.5)

## 2014-08-07 LAB — GLUCOSE, CAPILLARY
GLUCOSE-CAPILLARY: 206 mg/dL — AB (ref 70–99)
Glucose-Capillary: 155 mg/dL — ABNORMAL HIGH (ref 70–99)
Glucose-Capillary: 155 mg/dL — ABNORMAL HIGH (ref 70–99)
Glucose-Capillary: 167 mg/dL — ABNORMAL HIGH (ref 70–99)

## 2014-08-07 LAB — MAGNESIUM: MAGNESIUM: 2 mg/dL (ref 1.5–2.5)

## 2014-08-07 LAB — STREP PNEUMONIAE URINARY ANTIGEN: STREP PNEUMO URINARY ANTIGEN: NEGATIVE

## 2014-08-07 LAB — C-REACTIVE PROTEIN: CRP: 27.7 mg/dL — AB (ref <0.60)

## 2014-08-07 LAB — SEDIMENTATION RATE: Sed Rate: 96 mm/hr — ABNORMAL HIGH (ref 0–22)

## 2014-08-07 MED ORDER — ROCURONIUM BROMIDE 50 MG/5ML IV SOLN
INTRAVENOUS | Status: AC
  Filled 2014-08-07: qty 2

## 2014-08-07 MED ORDER — HYDROCORTISONE NA SUCCINATE PF 100 MG IJ SOLR
50.0000 mg | Freq: Three times a day (TID) | INTRAMUSCULAR | Status: DC
  Administered 2014-08-07 – 2014-08-11 (×14): 50 mg via INTRAVENOUS
  Filled 2014-08-07 (×15): qty 2

## 2014-08-07 MED ORDER — CETYLPYRIDINIUM CHLORIDE 0.05 % MT LIQD
7.0000 mL | Freq: Four times a day (QID) | OROMUCOSAL | Status: DC
  Administered 2014-08-08 – 2014-08-11 (×16): 7 mL via OROMUCOSAL

## 2014-08-07 MED ORDER — SUCCINYLCHOLINE CHLORIDE 20 MG/ML IJ SOLN
INTRAMUSCULAR | Status: AC
  Filled 2014-08-07: qty 1

## 2014-08-07 MED ORDER — FENTANYL CITRATE 0.05 MG/ML IJ SOLN: 50.0000 ug | Freq: Once | INTRAMUSCULAR | Status: AC

## 2014-08-07 MED ORDER — FENTANYL BOLUS VIA INFUSION
25.0000 ug | INTRAVENOUS | Status: DC | PRN
  Administered 2014-08-07: 50 ug via INTRAVENOUS
  Administered 2014-08-09: 100 ug via INTRAVENOUS
  Filled 2014-08-07: qty 50

## 2014-08-07 MED ORDER — PHENYLEPHRINE HCL 10 MG/ML IJ SOLN
30.0000 ug/min | INTRAVENOUS | Status: DC
  Administered 2014-08-07 – 2014-08-08 (×3): 30 ug/min via INTRAVENOUS
  Administered 2014-08-08: 15 ug/min via INTRAVENOUS
  Administered 2014-08-08 – 2014-08-09 (×3): 25 ug/min via INTRAVENOUS
  Administered 2014-08-09: 20 ug/min via INTRAVENOUS
  Administered 2014-08-10: 40 ug/min via INTRAVENOUS
  Administered 2014-08-10: 150 ug/min via INTRAVENOUS
  Administered 2014-08-10 (×2): 30 ug/min via INTRAVENOUS
  Administered 2014-08-10: 60 ug/min via INTRAVENOUS
  Filled 2014-08-07 (×13): qty 1

## 2014-08-07 MED ORDER — ETOMIDATE 2 MG/ML IV SOLN
INTRAVENOUS | Status: AC
  Administered 2014-08-07: 20 mg
  Filled 2014-08-07: qty 20

## 2014-08-07 MED ORDER — FENTANYL CITRATE 0.05 MG/ML IJ SOLN
12.5000 ug | Freq: Once | INTRAMUSCULAR | Status: AC
  Administered 2014-08-07: 12.5 ug via INTRAVENOUS
  Filled 2014-08-07: qty 2

## 2014-08-07 MED ORDER — MIDAZOLAM HCL 2 MG/2ML IJ SOLN
INTRAMUSCULAR | Status: AC
  Administered 2014-08-07: 2 mg
  Filled 2014-08-07: qty 4

## 2014-08-07 MED ORDER — ALBUTEROL SULFATE (2.5 MG/3ML) 0.083% IN NEBU: 2.5000 mg | INHALATION_SOLUTION | RESPIRATORY_TRACT | Status: DC | PRN

## 2014-08-07 MED ORDER — FENTANYL CITRATE 0.05 MG/ML IJ SOLN
INTRAMUSCULAR | Status: AC
  Administered 2014-08-07: 100 ug
  Filled 2014-08-07: qty 2

## 2014-08-07 MED ORDER — CHLORHEXIDINE GLUCONATE 0.12 % MT SOLN
15.0000 mL | Freq: Two times a day (BID) | OROMUCOSAL | Status: DC
  Administered 2014-08-07 – 2014-08-11 (×9): 15 mL via OROMUCOSAL
  Filled 2014-08-07 (×9): qty 15

## 2014-08-07 MED ORDER — INSULIN ASPART 100 UNIT/ML ~~LOC~~ SOLN
2.0000 [IU] | SUBCUTANEOUS | Status: DC
  Administered 2014-08-07: 4 [IU] via SUBCUTANEOUS
  Administered 2014-08-08 (×2): 6 [IU] via SUBCUTANEOUS

## 2014-08-07 MED ORDER — PREDNISONE 20 MG PO TABS
20.0000 mg | ORAL_TABLET | Freq: Every day | ORAL | Status: DC
  Administered 2014-08-07: 20 mg via ORAL

## 2014-08-07 MED ORDER — LIDOCAINE HCL (CARDIAC) 20 MG/ML IV SOLN
INTRAVENOUS | Status: AC
  Filled 2014-08-07: qty 5

## 2014-08-07 MED ORDER — FENTANYL CITRATE 0.05 MG/ML IJ SOLN
INTRAMUSCULAR | Status: AC
  Administered 2014-08-07: 100 ug
  Filled 2014-08-07: qty 4

## 2014-08-07 MED ORDER — OXEPA PO LIQD
1000.0000 mL | ORAL | Status: DC
  Administered 2014-08-07: 1000 mL
  Filled 2014-08-07 (×2): qty 1000

## 2014-08-07 MED ORDER — DOCUSATE SODIUM 50 MG/5ML PO LIQD
100.0000 mg | Freq: Every day | ORAL | Status: DC
  Administered 2014-08-07 – 2014-08-08 (×2): 100 mg
  Filled 2014-08-07 (×2): qty 10

## 2014-08-07 MED ORDER — SODIUM CHLORIDE 0.9 % IV SOLN
0.0000 ug/h | INTRAVENOUS | Status: DC
  Administered 2014-08-07: 50 ug/h via INTRAVENOUS
  Administered 2014-08-07: 12.5 ug/h via INTRAVENOUS
  Administered 2014-08-08 (×2): 150 ug/h via INTRAVENOUS
  Administered 2014-08-09: 225 ug/h via INTRAVENOUS
  Administered 2014-08-10 – 2014-08-11 (×5): 300 ug/h via INTRAVENOUS
  Filled 2014-08-07 (×10): qty 50

## 2014-08-07 NOTE — Progress Notes (Signed)
OT Cancellation Note  Patient Details Name: BRINDLEY MADARANG MRN: [id number] DOB: Feb 22, 1934   Cancelled Treatment:    Reason Eval/Treat Not Completed: Medical issues which prohibited therapy. Will try back next date.   Lennox Laity  [phone] 08/07/2014, 10:37 AM

## 2014-08-07 NOTE — Evaluation (Signed)
SLP Cancellation Note  Patient Details Name: Patricia Mosley MRN: [id number] DOB: 13-Jun-1934   Cancelled treatment:       Reason Eval/Treat Not Completed:  (plan for intubation per hospital staff due to respiratory issues, will sign off, please reorder as indicated)  Donavan Burnet, MS [facility] SLP [phone]

## 2014-08-07 NOTE — Progress Notes (Signed)
INITIAL NUTRITION ASSESSMENT  DOCUMENTATION CODES Per approved criteria  -Not Applicable   INTERVENTION: -Oxepa per feeding tube to increase to goal of 40 ml/hr  -Add prostat 30 ml once daily Above to provide 1540 kcal, 75 gm protein, and 758 ml free daily. -RD to follow.  NUTRITION DIAGNOSIS: Inadequate oral intake related to inability to eat  as evidenced by npo status..   Goal: Patient to meet >90% estimated needs with enteral nutrtion.  Monitor:  Monitor TF adequacy and tolerance  Reason for Assessment: TF managment  80 y.o. female  Admitting Dx: CAP (community acquired pneumonia)  ASSESSMENT: Patent with progressive respiratory failure, intubated this am.  Admitted with CAP.  Hyponatremia.  Patient was eating about 50% of her meals earlier in the stay.  Stable weiht around 140 lbs.    Las BM 11/5 and received rx from RN for this today. Oxepa at 20 ml/hr and tolerating well.  Patient is currently intubated on ventilator support MV: 11.3 L/min Temp (24hrs), Avg:98.8 F (37.1 C), Min:98.3 F (36.8 C), Max:99.5 F (37.5 C)  Height: Ht Readings from Last 1 Encounters:  08/06/14 5\' 5"  (1.651 m)    Weight: Wt Readings from Last 1 Encounters:  08/04/14 140 lb 6.9 oz (63.7 kg)    Ideal Body Weight: 125 lbs  % Ideal Body Weight: 112  Wt Readings from Last 10 Encounters:  08/04/14 140 lb 6.9 oz (63.7 kg)  07/07/14 137 lb (62.143 kg)  11/25/13 145 lb (65.772 kg)  11/07/13 145 lb 12.8 oz (66.134 kg)  10/14/13 142 lb (64.411 kg)  07/15/13 140 lb (63.504 kg)  03/10/12 140 lb (63.504 kg)  09/24/11 138 lb (62.596 kg)  05/07/10 149 lb (67.586 kg)    Usual Body Weight: 140  % Usual Body Weight: 100  BMI:  Body mass index is 23.37 kg/(m^2).  Estimated Nutritional Needs: Kcal: 1474 Protein: 70-80 gm  Fluid: >/=1.6L  Skin: WDL  Diet Order:  NPO  EDUCATION NEEDS: -No education needs identified at this time   Intake/Output Summary (Last 24 hours) at  08/07/14 1502 Last data filed at 08/07/14 1400  Gross per 24 hour  Intake  494.1 ml  Output   1534 ml  Net -1039.9 ml    Last BM: unknown  Labs:   Recent Labs Lab 08/04/14 0300 08/05/14 0909 08/07/14 0355  NA 131* 134* 129*  K 4.5 4.0 5.3  CL 96 97 90*  CO2 24 26 25   BUN 10 11 14   CREATININE 0.67 0.72 0.64  CALCIUM 8.5 8.7 9.5  MG  --   --  2.0  GLUCOSE 151* 146* 161*    CBG (last 3)   Recent Labs  08/06/14 1131 08/06/14 1706 08/06/14 2220  GLUCAP 136* 188* 134*    Scheduled Meds: . calcium-vitamin D  1 tablet Oral Daily  . ceFEPime (MAXIPIME) IV  1 g Intravenous 3 times per day  . docusate  100 mg Per Tube Daily  . famotidine  40 mg Oral QHS  . feeding supplement (OXEPA)  1,000 mL Per Tube Q24H  . folic acid  1 mg Oral BID  . heparin  5,000 Units Subcutaneous 3 times per day  . hydrocortisone sod succinate (SOLU-CORTEF) inj  50 mg Intravenous Q8H  . hydroxychloroquine  200 mg Oral BID  . insulin aspart  0-15 Units Subcutaneous TID WC  . insulin glargine  8 Units Subcutaneous Daily  . lidocaine (cardiac) 100 mg/24ml      . lip  balm  1 application Topical BID  . magic mouthwash  15 mL Oral TID  . rocuronium      . saccharomyces boulardii  250 mg Oral BID  . senna-docusate  2 tablet Oral BID  . sodium chloride  3 mL Intravenous Q12H  . succinylcholine      . vancomycin  500 mg Intravenous Q12H    Continuous Infusions: . fentaNYL infusion INTRAVENOUS 200 mcg/hr (08/07/14 1243)  . phenylephrine (NEO-SYNEPHRINE) Adult infusion 40 mcg/min (08/07/14 1433)    Past Medical History  Diagnosis Date  . Diabetes mellitus     "prednisone induced" per patient  . Osteoporosis   . Rheumatoid arthritis(714.0)   . Fibromyalgia     Past Surgical History  Procedure Laterality Date  . Tonsillectomy    . "womb suspension" and appendectomy      age 5  . Dilation and curettage of uterus    . Cystoscopy      evaluating UTI    Oran Rein, RD, LDN Clinical  Inpatient Dietitian Pager:  [phone] Weekend and after hours pager:  [phone]

## 2014-08-07 NOTE — Consult Note (Signed)
[facility]     Reason for Consult: CAP    Referring Physician: Dr. Gonzella Lex  Principal Problem:   CAP (community acquired pneumonia) Active Problems:   Rheumatoid arthritis   Myalgia and myositis   Chest pain   DM type 2 (diabetes mellitus, type 2)   Dehydration   Hyponatremia   Fall in inpatient room   Simple laceration of face   . calcium-vitamin D  1 tablet Oral Daily  . ceFEPime (MAXIPIME) IV  1 g Intravenous 3 times per day  . docusate  100 mg Per Tube Daily  . famotidine  40 mg Oral QHS  . fentaNYL      . fentaNYL  50 mcg Intravenous Once  . FLUoxetine  20 mg Oral Daily  . folic acid  1 mg Oral BID  . heparin  5,000 Units Subcutaneous 3 times per day  . hydroxychloroquine  200 mg Oral BID  . insulin aspart  0-15 Units Subcutaneous TID WC  . insulin glargine  8 Units Subcutaneous Daily  . lidocaine (cardiac) 100 mg/42ml      . lip balm  1 application Topical BID  . magic mouthwash  15 mL Oral TID  . predniSONE  20 mg Oral Q breakfast  . rocuronium      . saccharomyces boulardii  250 mg Oral BID  . senna-docusate  2 tablet Oral BID  . sodium chloride  3 mL Intravenous Q12H  . succinylcholine      . vancomycin  500 mg Intravenous Q12H    Recommendations: Continue current care per CCM  Discussed with Dr. Vassie Loll and I am available if needed but otherwise will defer to CCM.    Assessment: She has severe CAP, viral vs bacterial and is on broad spectrum antibiotics.  Has been on a minimal amount of steroids with low risk for immunosuppression.      Antibiotics: Vancomycin and cefepime  HPI: Patricia Mosley is a 80 y.o. female with RA, DM, fibromyalgias who presented with generalized weakness, fatigue and had he prednisone increase from 2 mg to 7.5 but continue to feel poor.  Developed cough and CXR with multifocal pneunonia.  Began to become hypoxic, CT with multifocal pneumonia.  WBC increasing, hypoxic on ABG and currently being  intubated.     Review of Systems: Review of systems not obtained due to patient factors.  Past Medical History  Diagnosis Date  . Diabetes mellitus     "prednisone induced" per patient  . Osteoporosis   . Rheumatoid arthritis(714.0)   . Fibromyalgia     History  Substance Use Topics  . Smoking status: Never Smoker   . Smokeless tobacco: Never Used  . Alcohol Use: No    Family History  Problem Relation Age of Onset  . Heart disease Mother    Allergies  Allergen Reactions  . Acetaminophen     Ineffective.   . Adalimumab     "Sinking spells."  . Allegra [Fexofenadine]     "Serum sickness."   . Allopurinol     "Serum sickness."   . Boniva [Ibandronic Acid]     Oral tablet causes chest pain. IV Boniva OK.    . Ciprofloxacin     "Pains in legs that may have been some mild tendonitis. She could maybe take it again."   . Codeine     Auditory and visual hallucinations.   . Dextromethorphan Nausea And Vomiting  . Enbrel [Etanercept]     "  Sinking spells."  . Epinephrine     Unknown.    . Estrogens     "Felt sick."   . Forteo [Teriparatide (Recombinant)]     "Felt weird."   . Gabapentin     Unknown.    . Infliximab     Unknown.    Barbera Setters [Levofloxacin]     "Bilateral inflamed achilles tendons."   . Levemir [Insulin Detemir] Itching  . Meperidine Hcl     Unknown.   . Methocarbamol     Unknown.   . Methotrexate     Unknown.    . Other     "Eye dilating medicine." "Makes her 'goofy.'"   . Penicillins     Unknown.   Marland Kitchen Pentazocine Lactate     Unknown.    . Quinine Derivatives Diarrhea and Nausea And Vomiting  . Qvar [Beclomethasone]     "Made throat hurt worse and maybe made her GERD worse so stopped after 4 days."  . Risedronate Sodium     Tolerates IV boniva  . Robitussin (Alcohol Free) [Guaifenesin]     "Made sick."   . Sulfonamide Derivatives     Unknown.   . Tetracycline     Unknown.    . Tramadol Other (See Comments)    Hallucinations   .  Doxycycline Diarrhea  . Metronidazole Rash  . Vitamin D2 [Ergocalciferol] Rash    GI upset.     OBJECTIVE: Blood pressure 130/52, pulse 101, temperature 98.7 F (37.1 C), temperature source Axillary, resp. rate 24, height 5\' 5"  (1.651 m), weight 140 lb 6.9 oz (63.7 kg), SpO2 93 %. General: awake, moderate distress Skin: no rashes Deferred exam due to evaluation for intubation  Microbiology: No results found for this or any previous visit (from the past 240 hour(s)).  , MD [email or web address] Staci Righter pager  [phone] cell 08/07/2014, 10:44 AM

## 2014-08-07 NOTE — Consult Note (Signed)
PULMONARY / CRITICAL CARE MEDICINE   Name: Patricia Mosley MRN: [id number] DOB: 06-Nov-1933    ADMISSION DATE:  08/09/2014 CONSULTATION DATE:  08/07/14  REFERRING MD :  Dr. Gonzella Lex   CHIEF COMPLAINT:  Hypoxic Respiratory Failure   INITIAL PRESENTATION: 80 y/o F admitted 11/5 with cough & generalized weakness.  Work up concerning for R CAP and patient admitted for evaluation.  Patient had progressive hypoxemia and PCCM consulted for evaluation.    STUDIES:  11/07  CT Head >> no acute abnormality, chronic microvascular ischemic white matter disease  11/08  CTA Chest >> no PE, mod bilateral multifocal airspace disease, small B pleural effusions, mod-lg hiatal hernia  SIGNIFICANT EVENTS: 11/05  Admit per [facility] with CAP, Rx'd as outpatient with azithro & increase in baseline prednisone  11/07  N/V 11/08  Development of worsening R sided airspace disease, CTA chest neg for Pe 11/09  Decompensated, hypoxic requiring intubation   HISTORY OF PRESENT ILLNESS:  80 y/o F with a PMH of DM II, Fibromyalgia, Osteoporosis, and RA on chronic steroids admitted 11/5 with cough & generalized weakness.  The patient had approximately one week of generalized weakness and had been seen by her PCP on 10/30. Her exam was within normal limits and her prednisone was increased to 7.5 mg for 3 days.  She did not have improvement in symptoms and returned to her PCP on 11/2 as she had developed a cough.  CXR assessed at that time was concerning for PNA and she was treated with a Z-Pak but had no improvement.    At baseline, she continues to drive, is able to prepare meals, travels with her husband and is independent of activities.  She does not use assistive devices for ambulation and is able to complete all ADL's.    She was admitted per [facility] on 11/5 for CAP and treated accordingly.  Patient developed nausea / vomiting during admission and also suffered a fall the early am of 11/7.  11/8 CXR reflected interval development of  R sided airspace disease. CTA chest was assessed 11/8 and negative for PE but demonstrated a rapid development of dense bilateral airspace disease.  She became progressively hypoxic and PCCM was consulted 11/9 am.    PAST MEDICAL HISTORY :   has a past medical history of Diabetes mellitus; Osteoporosis; Rheumatoid arthritis(714.0); and Fibromyalgia.  has past surgical history that includes Tonsillectomy; "womb suspension" and appendectomy; Dilation and curettage of uterus; and Cystoscopy.   HOME MEDICATIONS: Prior to Admission medications   Medication Sig Start Date End Date Taking? Authorizing Provider  albuterol (PROVENTIL HFA;VENTOLIN HFA) 108 (90 BASE) MCG/ACT inhaler Inhale 1 puff into the lungs every 6 (six) hours as needed for wheezing or shortness of breath.   Yes Historical Provider, MD  calcium-vitamin D (OSCAL WITH D) 500-200 MG-UNIT per tablet Take 1 tablet by mouth daily.     Yes Historical Provider, MD  cholecalciferol (VITAMIN D) 1000 UNITS tablet Take 1,000 Units by mouth daily.   Yes Historical Provider, MD  FLUoxetine (PROZAC) 20 MG capsule Take 20 mg by mouth daily.     Yes Historical Provider, MD  folic acid (FOLVITE) 1 MG tablet Take 1 mg by mouth 2 (two) times daily.     Yes Historical Provider, MD  hydroxychloroquine (PLAQUENIL) 200 MG tablet Take 200 mg by mouth 2 (two) times daily.     Yes Historical Provider, MD  ibandronate (BONIVA) 3 MG/3ML SOLN injection Inject 3 mLs (3 mg total) into  the vein once. 03/10/12  Yes Ezequiel Kayser, MD  Insulin Glargine (TOUJEO SOLOSTAR) 300 UNIT/ML SOPN Inject 8 Units into the skin daily. Before breakfast   Yes Historical Provider, MD  LORazepam (ATIVAN) 0.5 MG tablet Take 0.5 mg by mouth Nightly.     Yes Historical Provider, MD  morphine 20 MG/5ML solution Take 5-10 mg by mouth every 2 (two) hours as needed for pain.   Yes Historical Provider, MD  NITROSTAT 0.4 MG SL tablet Place 0.4 mg under the tongue every 5 (five) minutes as needed for  chest pain.  10/20/13  Yes Historical Provider, MD  PANTOPRAZOLE SODIUM PO Take 1 tablet by mouth every morning.   Yes Historical Provider, MD  predniSONE (DELTASONE) 1 MG tablet Take 2 mg by mouth daily with breakfast. Take with prednisone 5 mg   Yes Historical Provider, MD  predniSONE (DELTASONE) 5 MG tablet Take 5 mg by mouth daily. Take with prednisone 2 mg   Yes Historical Provider, MD  Probiotic Product (ALIGN PO) Take 1 capsule by mouth daily.   Yes Historical Provider, MD  promethazine (PHENERGAN) 12.5 MG tablet Take 12.5 mg by mouth every 6 (six) hours as needed for nausea or vomiting.    Yes Historical Provider, MD  ranitidine (ZANTAC) 150 MG capsule Take 150 mg by mouth 2 (two) times daily.     Yes Historical Provider, MD  sitaGLIPtin (JANUVIA) 50 MG tablet Take 100 mg by mouth daily.    Yes Historical Provider, MD  pantoprazole (PROTONIX) 20 MG tablet Take 40 mg by mouth daily.     Historical Provider, MD  sucralfate (CARAFATE) 1 G tablet Take 1 tablet (1 g total) by mouth 4 (four) times daily. Patient not taking: Reported on 08/15/2014 07/07/14   Olivia Mackie, MD   Allergies  Allergen Reactions  . Acetaminophen     Ineffective.   . Adalimumab     "Sinking spells."  . Allegra [Fexofenadine]     "Serum sickness."   . Allopurinol     "Serum sickness."   . Boniva [Ibandronic Acid]     Oral tablet causes chest pain. IV Boniva OK.    . Ciprofloxacin     "Pains in legs that may have been some mild tendonitis. She could maybe take it again."   . Codeine     Auditory and visual hallucinations.   . Dextromethorphan Nausea And Vomiting  . Enbrel [Etanercept]     "Sinking spells."  . Epinephrine     Unknown.    . Estrogens     "Felt sick."   . Forteo [Teriparatide (Recombinant)]     "Felt weird."   . Gabapentin     Unknown.    . Infliximab     Unknown.    Barbera Setters [Levofloxacin]     "Bilateral inflamed achilles tendons."   . Levemir [Insulin Detemir] Itching  . Meperidine  Hcl     Unknown.   . Methocarbamol     Unknown.   . Methotrexate     Unknown.    . Other     "Eye dilating medicine." "Makes her 'goofy.'"   . Penicillins     Unknown.   Marland Kitchen Pentazocine Lactate     Unknown.    . Quinine Derivatives Diarrhea and Nausea And Vomiting  . Qvar [Beclomethasone]     "Made throat hurt worse and maybe made her GERD worse so stopped after 4 days."  . Risedronate Sodium     Tolerates  IV boniva  . Robitussin (Alcohol Free) [Guaifenesin]     "Made sick."   . Sulfonamide Derivatives     Unknown.   . Tetracycline     Unknown.    . Tramadol Other (See Comments)    Hallucinations   . Doxycycline Diarrhea  . Metronidazole Rash  . Vitamin D2 [Ergocalciferol] Rash    GI upset.     FAMILY HISTORY:  indicated that her mother is deceased. She indicated that her father is deceased.    SOCIAL HISTORY:  reports that she has never smoked. She has never used smokeless tobacco. She reports that she does not drink alcohol or use illicit drugs.  REVIEW OF SYSTEMS:  Unable to complete as patient is altered.  Information obtained from chart review and family at bedside.   SUBJECTIVE:   VITAL SIGNS: Temp:  [98.6 F (37 C)-99.5 F (37.5 C)] 98.7 F (37.1 C) (11/09 0000) Pulse Rate:  [81-101] 101 (11/09 0800) Resp:  [16-34] 24 (11/09 0800) BP: (106-130)/(45-71) 130/52 mmHg (11/09 0800) SpO2:  [90 %-100 %] 93 % (11/09 0815) FiO2 (%):  [100 %] 100 % (11/09 1056)   HEMODYNAMICS:     VENTILATOR SETTINGS: Vent Mode:  [-] PRVC FiO2 (%):  [100 %] 100 % Set Rate:  [22 bmp] 22 bmp Vt Set:  [500 mL] 500 mL PEEP:  [8 cmH20] 8 cmH20 Plateau Pressure:  [25 cmH20] 25 cmH20   INTAKE / OUTPUT:  Intake/Output Summary (Last 24 hours) at 08/07/14 1109 Last data filed at 08/07/14 0757  Gross per 24 hour  Intake    672 ml  Output   1374 ml  Net   -702 ml    PHYSICAL EXAMINATION: General:  Frail elderly female in NAD Neuro:  Intermittent agitation, opens eyes to  voice, appropriate at times, MAE's  HEENT:  Edentulous, tongue coated w/ white/brown plaque, 3 finger mouth opening, 3-4 finger TM distance  Cardiovascular:  s1s2 rrr, no m/r/g 3 Lungs:  resp's even/non-labored, lungs bilaterally coarse, few faint crackles  Abdomen:  Round/soft, bsx4 active  Musculoskeletal:  Finger / toe deformities consistent with RA Skin:  Warm/dry, R eye facial laceration with steri-strip's c/d/i  LABS:  CBC  Recent Labs Lab 08/05/14 0909 08/06/14 0525 08/07/14 0355  WBC 16.5* 18.7* 25.2*  HGB 9.8* 9.5* 11.3*  HCT 31.1* 29.8* 35.0*  PLT 558* 571* 438*   Coag's No results for input(s): APTT, INR in the last 168 hours.   BMET  Recent Labs Lab 08/04/14 0300 08/05/14 0909 08/07/14 0355  NA 131* 134* 129*  K 4.5 4.0 5.3  CL 96 97 90*  CO2 24 26 25   BUN 10 11 14   CREATININE 0.67 0.72 0.64  GLUCOSE 151* 146* 161*   Electrolytes  Recent Labs Lab 08/04/14 0300 08/05/14 0909 08/07/14 0355  CALCIUM 8.5 8.7 9.5  MG  --   --  2.0   Sepsis Markers  Recent Labs Lab 08/19/2014 1339  LATICACIDVEN 1.31   ABG  Recent Labs Lab 08/06/14 1006 08/07/14 0750  PHART 7.400 7.421  PCO2ART 42.0 39.4  PO2ART 50.8* 56.0*   Liver Enzymes  Recent Labs Lab 08/04/14 0300  AST 19  ALT 17  ALKPHOS 65  BILITOT 0.3  ALBUMIN 2.1*   Cardiac Enzymes  Recent Labs Lab 07/30/2014 2100 08/04/14 0300 08/05/14 0909  TROPONINI <0.30 <0.30 <0.30   Glucose  Recent Labs Lab 08/05/14 1301 08/05/14 1633 08/05/14 2110 08/06/14 1131 08/06/14 1706 08/06/14 2220  GLUCAP 176* 269*  104* 136* 188* 134*    Imaging Ct Angio Chest Pe W/cm &/or Wo Cm  08/06/2014   CLINICAL DATA:  General weakness x 1 week with dry cough. Patient treating pneumonia. WBC=18.7* Eval for pe;;weakness, dry cough  EXAM: CT ANGIOGRAPHY CHEST WITH CONTRAST  TECHNIQUE: Multidetector CT imaging of the chest was performed using the standard protocol during bolus administration of  intravenous contrast. Multiplanar CT image reconstructions and MIPs were obtained to evaluate the vascular anatomy.  CONTRAST:  OMNIPAQUE IOHEXOL 350 MG/ML SOLN  COMPARISON:  Chest x-ray today.  CT abdomen/ pelvis 07/07/2014.  FINDINGS: Examination demonstrates patchy bilateral multifocal airspace process with air bronchograms most prominent over the mid to upper lungs. There are small bilateral pleural effusions. These findings are new compared to the previous CT and likely due to multifocal infection. Heart is normal size. There is no evidence of pulmonary embolism. There is mild calcified plaque involving thoracic aorta. No change in a moderate to large hiatal hernia with fluid throughout the esophagus. No definite hilar or mediastinal adenopathy.  Images through the upper abdomen demonstrate no change in a 1.6 cm hypodensity over the left lobe of the liver likely a cyst. Remainder of the exam is unchanged.  Review of the MIP images confirms the above findings.  IMPRESSION: No evidence of pulmonary also.  Moderate bilateral multifocal airspace process with small bilateral pleural effusions likely due to multifocal pneumonia.  Moderate to large hiatal hernia. Fluid throughout the esophagus which may be due to dysmotility versus reflux.  No change in 1.6 cm left liver hypodensity likely a cyst.   Electronically Signed   By: Elberta Fortis M.D.   On: 08/06/2014 20:19   Dg Chest Port 1 View  08/06/2014   CLINICAL DATA:  Shortness of breath, hypoxia  EXAM: PORTABLE CHEST - 1 VIEW  COMPARISON:  August 27, 2014 and 08/05/2014  FINDINGS: Increased ill-defined right mid lung zone and left upper lobe patchy airspace opacity. Large hiatal hernia. Heart size is mildly enlarged.  IMPRESSION: Increased right mid lung zone and left upper lobe airspace consolidation. This could represent worsening pneumonia, asymmetric edema, or other alveolar filling processes.   Electronically Signed   By: Christiana Pellant M.D.   On:  08/06/2014 10:01     ASSESSMENT / PLAN:  PULMONARY OETT 11/9 >>  A: Acute Hypoxic Respiratory Failure - PaO2/FiO2 ratio consistent with ARDS ARDS Diffuse Bilateral Airspace Disease - DDx includes aspiration (hiatal hernia + vomiting), viral / bacterial etiology, opportunistic infection / MAI (plaquenil + prednisone 7.5mg ), viral pneumonitis, rheumatoid interstitial disease.   Respiratory Alkalosis  Small Bilateral Pleural Effusions  P:   Intubation for hypoxic failure Post intubation ABG, CXR Trend airspace disease Obtain sputum sample, not likely a candidate for bronchoscopy at this time due to O2 needs Q3 PRN Albuterol  See ID Consider diagnostic sampling of pleural fluid if enlarging   CARDIOVASCULAR CVL A:  Tachycardia - compensatory P:  Continue ICU monitoring  Neo gtt to bedside for intubation  NS @ 75   RENAL A:   Mild Hyperkalemia  P:   Trend BMP Replace electrolytes as indicated   GASTROINTESTINAL A:   No acute process P:   Pepcid for SUP Insert OGT Start TF 11/9 afternoon   HEMATOLOGIC A:   Leukocytosis  Mild Anemia  P:  Trend CBC Heparin for DVT prophylaxis   INFECTIOUS A:   Bilateral Diffuse Infiltrates  P:   BCx2 11/9 >> Sputum 11/9 >>  RVP 11/9 >>  Strep Pneumo 11/9 >> neg Abx: Azithro, start date 11/6 x 1 dose Abx: Rocephin, start date 11/5, x1 dose  Abx: Vanco, start date 11/8, day 2/x Abx: Cefepime, start date 11/8, day 2/x  ENDOCRINE A:   DM II P:   SSI Lantus  NEUROLOGIC A:   Fibromyalgia  Rheumatoid Arthritis - on plaquenil + prednisone 5 mg at baseline Pain - ICU discomfort + ? Fibro flare Mechanical Fall P:   RASS goal: 0 Fentanyl PAD protocol    FAMILY  - Updates: Husband and son updated at bedside extensively.   - Inter-disciplinary family meet or Palliative Care meeting due by:   Canary Brim, NP-C [facility] Pgr: [phone] or [phone]  ATTENDING NOTE: I have personally  reviewed patient's available data, including medical history, events of note, physical examination and test results as part of my evaluation. I have discussed with resident/NP and other careteam providers such as pharmacist, RN and RRT & co-ordinated with consultants. In addition, I personally evaluated patient and elicited key history of chronic pain, RA on steroids, recent fall & admission 11/5 with BL infiltrates ON CXR , exam findings of hypoxic on NRB, moderate WOB with accessory muscle use & labs showing worsening leucocytosis & BL consolidations & effusions on CT with para esophageal hernia.  Would favor aspiration pna, DD includes CAP, doubt PCP  - OK with Vanc/cefepime , send resp cx incl viral panel & PCP for completion (doubt).  Rest per NP/medical resident whose note is outlined above and that I agree with and edited in full.  Long discussion with son & husband  The patient is critically ill with multiple organ systems failure and requires high complexity decision making for assessment and support, frequent evaluation and titration of therapies, application of advanced monitoring technologies and extensive interpretation of multiple databases. Critical Care Time devoted to patient care services described in this note is 60 minutes , independent of procedures.    Cyril Mourning MD. Tonny Bollman. [facility] Pager [phone] If no response call [phone]     08/07/2014, 11:09 AM

## 2014-08-07 NOTE — Plan of Care (Signed)
Problem: Consults Goal: Nutrition Consult-if indicated Outcome: Completed/Met Date Met:  08/07/14  Problem: Phase I Progression Outcomes Goal: VTE prophylaxis Outcome: Completed/Met Date Met:  08/07/14 Goal: GIProphysixis Outcome: Completed/Met Date Met:  08/07/14 Goal: Oral Care per Protocol Outcome: Progressing Goal: HOB elevated 30 degrees Outcome: Completed/Met Date Met:  08/07/14 Goal: VAP prevention protocol initiated Outcome: Completed/Met Date Met:  08/07/14 Goal: Sedation Protocol initiated if indicated Outcome: Progressing Goal: Code status addressed with pt/family Outcome: Not Applicable Date Met:  32/54/98

## 2014-08-07 NOTE — Procedures (Signed)
Intubation Procedure Note Patricia Mosley [id number] March 29, 1934  Procedure: Intubation Indications: Respiratory insufficiency  Procedure Details Consent: Risks of procedure as well as the alternatives and risks of each were explained to the (patient/caregiver).  Consent for procedure obtained. Time Out: Verified patient identification, verified procedure, site/side was marked, verified correct patient position, special equipment/implants available, medications/allergies/relevent history reviewed, required imaging and test results available.  Performed  Maximum sterile technique was used including antiseptics, cap, gloves, hand hygiene and mask.  MAC and 3    Evaluation Hemodynamic Status: BP stable throughout; O2 sats: transiently fell during during procedure Patient's Current Condition: stable Complications: No apparent complications Patient did tolerate procedure well. Chest X-ray ordered to verify placement.  CXR: pending.   Taffy Delconte V. 08/07/2014

## 2014-08-07 NOTE — Progress Notes (Signed)
[facility] PROGRESS NOTE  Patricia Mosley VPX:[id number] DOB: July 07, 1934 DOA: 08/26/2014 PCP: Ezequiel Kayser, MD  Brief Narrative 80 year old female with history of type 2 diabetes mellitus, rheumatoid arthritis on chronic steroid, fibromyalgia on morphine at home presented with generalized weakness for past 1 week associated with dry cough. Chest x-ray done as outpatient showed pneumonia and was started on Z-Pak however symptoms did not improve and patient came to the emergency department. A chest x-ray done in the ED showed bilateral multilobar pneumonia as well as hiatal hernia. Also showed small left apical lung nodule. She  was also found to have hyponatremia. Patient admitted for community-acquired pneumonia. Hospital course complicated with persistently worsening lateral pneumonia and hypoxic respiratory failure.  Assessment/Plan:  Acute hypoxic respiratory failure  Patient became hypoxic on 11/8 and placed on nonrebreather. Chest x-ray repeated showing worsening bilateral pneumonia. Patient complaining of chest pain as well. CT angiogram of the chest done to rule out PE was negative but showed diffuse bilateral pneumonia with small bilateral effusions. Antibiotic escalated to IV vancomycin and cefepime. Patient transferred to stepdown unit. She has been on nonrebreather overnight with dropping O2 sats. PCCM and ID consulted. Patient intubated this morning. -Further plan per ICU team  Community-acquired pneumonia -antibiotics escalated to IV vancomycin and cefepime on 11/8. Follow urine strep and Legionella antigen. Further plan per PC CM.  Hyponatremia Possibly hypovolemic and associated pneumonia. Given IV fluids with some improvement.  Chronic pain/ fibromyalgia/ chest pain  patient has hx of fibromyalgia, RA and chronic costochondritis. Possible worsening from underlying illness vs pleurisy. She also had a fall at bedside on 11/7 and hit her chest on the telemetry box. She  sustained laceration over left orbital area. Head CT unremarkable. Xray of ribs negative for fracture. As per husband she has prescriptions ofr morphine at home but does not use it frequently. palliative care consulted to help with her pain management an appreciate recommendations.  Fall with laceration to right orbit continue sterri strip. No need for sutures for surgery    Leukocytosis Possibly from worsened PNA inpatient receiving stress dose steroid on 11/8.  Chronic rheumatoid arthritis On low-dose prednisone and plaquenil at home which is continued in the hospital  GERD with hiatal hernia Dose of proton and Pepcid increase due to ongoing abdominal pain   Type 2 diabetes mellitus A1c of 7.2. continue home  dose Lantus and sliding scale insulin   Chronic abdominal Lower extremity pain History of chronic fibromyalgia and is on frequent morphine at home which is been continued.   Generalized weakness and protein calorie malnutrition Physical therapy and nutrition consult     DVT prophylaxis: Subcutaneous heparin      Code Status: full code Family Communication: spoke with husband and son at bedside Disposition Plan: currently in ICU   Consultants:  [facility]  PC CM  ID  Procedures:  CT angiogram of the chest 11/8  Intubation 11/9  Antibiotics:  Rocephin and azithromycin 11/5--11/8  IV vancomycin and cefepime 11/8--  HPI/Subjective: Patient transferred to stepdown given acute hypoxia and placed on nonrebreather. Antibiotics escalated to IV vancomycin and cefepime. Patient morning with pain and was given when necessary IV fentanyl and Ativan for agitation after which she was somnolent. Patient requiring nonrebreather overnight with dropping O2 sat to 80s periodically. Seen by PCCM and patient intubated this morning.  Objective: Filed Vitals:   08/07/14 0800  BP: 130/52  Pulse: 101  Temp:   Resp: 24    Intake/Output Summary (Last  24  hours) at 08/07/14 1120 Last data filed at 08/07/14 0757  Gross per 24 hour  Intake    672 ml  Output   1374 ml  Net   -702 ml   Filed Weights   08/04/14 0529  Weight: 63.7 kg (140 lb 6.9 oz)    Exam:   General:  Elderly thin built female , lethargic and crying out in pain periodically  HEENT: Laceration over right infraorbital area appears clean, on an RB  Chest: Tender to minimal touch over the chest and abdomen, diminished  breath sounds bilaterally,  no added sounds  CVS: Normal S1 and S2, no murmurs  Abdomen: Soft, nontender, winces in pain on minimal touch, bowel sounds present  extremities: Warm, no edema  CNS: sleepy but arousable, poorly oriented  Data Reviewed: Basic Metabolic Panel:  Recent Labs Lab August 17, 2014 1326 08/04/14 0300 08/05/14 0909 08/07/14 0355  NA 124* 131* 134* 129*  K 4.2 4.5 4.0 5.3  CL 91* 96 97 90*  CO2 20 24 26 25   GLUCOSE 177* 151* 146* 161*  BUN 9 10 11 14   CREATININE 0.67 0.67 0.72 0.64  CALCIUM 8.3* 8.5 8.7 9.5  MG  --   --   --  2.0   Liver Function Tests:  Recent Labs Lab 08/04/14 0300  AST 19  ALT 17  ALKPHOS 65  BILITOT 0.3  PROT 6.0  ALBUMIN 2.1*   No results for input(s): LIPASE, AMYLASE in the last 168 hours. No results for input(s): AMMONIA in the last 168 hours. CBC:  Recent Labs Lab 08/17/2014 1326 08/04/14 0300 08/05/14 0909 08/06/14 0525 08/07/14 0355  WBC 15.3* 14.1* 16.5* 18.7* 25.2*  NEUTROABS 13.5*  --   --   --   --   HGB 10.2* 9.9* 9.8* 9.5* 11.3*  HCT 31.7* 30.6* 31.1* 29.8* 35.0*  MCV 79.4 79.1 79.1 79.3 79.2  PLT 525* 547* 558* 571* 438*   Cardiac Enzymes:  Recent Labs Lab 08-17-14 2100 08/04/14 0300 08/05/14 0909  TROPONINI <0.30 <0.30 <0.30   BNP (last 3 results) No results for input(s): PROBNP in the last 8760 hours. CBG:  Recent Labs Lab 08/05/14 1633 08/05/14 2110 08/06/14 1131 08/06/14 1706 08/06/14 2220  GLUCAP 269* 104* 136* 188* 134*    No results found for  this or any previous visit (from the past 240 hour(s)).   Studies: Dg Ribs Bilateral  08/05/2014   CLINICAL DATA:  Fall.  Rib pain.  EXAM: BILATERAL RIBS - 3+ VIEW  COMPARISON:  08/17/14  FINDINGS: No fracture or other bone lesions are seen involving the ribs.  Bilateral pneumonia, stable from two days ago. No cardiomegaly ; stable aortic and hilar contours. Moderate hiatal hernia.  IMPRESSION: 1. No acute osseous findings. 2. Bilateral pneumonia. 3. Hiatal hernia.   Electronically Signed   By: 10-25-1970 M.D.   On: 08/05/2014 11:59   Ct Angio Chest Pe W/cm &/or Wo Cm  08/06/2014   CLINICAL DATA:  General weakness x 1 week with dry cough. Patient treating pneumonia. WBC=18.7* Eval for pe;;weakness, dry cough  EXAM: CT ANGIOGRAPHY CHEST WITH CONTRAST  TECHNIQUE: Multidetector CT imaging of the chest was performed using the standard protocol during bolus administration of intravenous contrast. Multiplanar CT image reconstructions and MIPs were obtained to evaluate the vascular anatomy.  CONTRAST:  13/03/2014 OMNIPAQUE IOHEXOL 350 MG/ML SOLN  COMPARISON:  Chest x-ray today.  CT abdomen/ pelvis 07/07/2014.  FINDINGS: Examination demonstrates patchy bilateral multifocal airspace process with  air bronchograms most prominent over the mid to upper lungs. There are small bilateral pleural effusions. These findings are new compared to the previous CT and likely due to multifocal infection. Heart is normal size. There is no evidence of pulmonary embolism. There is mild calcified plaque involving thoracic aorta. No change in a moderate to large hiatal hernia with fluid throughout the esophagus. No definite hilar or mediastinal adenopathy.  Images through the upper abdomen demonstrate no change in a 1.6 cm hypodensity over the left lobe of the liver likely a cyst. Remainder of the exam is unchanged.  Review of the MIP images confirms the above findings.  IMPRESSION: No evidence of pulmonary also.  Moderate bilateral  multifocal airspace process with small bilateral pleural effusions likely due to multifocal pneumonia.  Moderate to large hiatal hernia. Fluid throughout the esophagus which may be due to dysmotility versus reflux.  No change in 1.6 cm left liver hypodensity likely a cyst.   Electronically Signed   By: Elberta Fortis M.D.   On: 08/06/2014 20:19   Dg Chest Port 1 View  08/06/2014   CLINICAL DATA:  Shortness of breath, hypoxia  EXAM: PORTABLE CHEST - 1 VIEW  COMPARISON:  09/01/14 and 08/05/2014  FINDINGS: Increased ill-defined right mid lung zone and left upper lobe patchy airspace opacity. Large hiatal hernia. Heart size is mildly enlarged.  IMPRESSION: Increased right mid lung zone and left upper lobe airspace consolidation. This could represent worsening pneumonia, asymmetric edema, or other alveolar filling processes.   Electronically Signed   By: Christiana Pellant M.D.   On: 08/06/2014 10:01    Scheduled Meds: . calcium-vitamin D  1 tablet Oral Daily  . ceFEPime (MAXIPIME) IV  1 g Intravenous 3 times per day  . docusate  100 mg Per Tube Daily  . famotidine  40 mg Oral QHS  . feeding supplement (OXEPA)  1,000 mL Per Tube Q24H  . fentaNYL      . fentaNYL  50 mcg Intravenous Once  . folic acid  1 mg Oral BID  . heparin  5,000 Units Subcutaneous 3 times per day  . hydrocortisone sod succinate (SOLU-CORTEF) inj  50 mg Intravenous Q8H  . hydroxychloroquine  200 mg Oral BID  . insulin aspart  0-15 Units Subcutaneous TID WC  . insulin glargine  8 Units Subcutaneous Daily  . lidocaine (cardiac) 100 mg/75ml      . lip balm  1 application Topical BID  . magic mouthwash  15 mL Oral TID  . rocuronium      . saccharomyces boulardii  250 mg Oral BID  . senna-docusate  2 tablet Oral BID  . sodium chloride  3 mL Intravenous Q12H  . succinylcholine      . vancomycin  500 mg Intravenous Q12H   Continuous Infusions: . fentaNYL infusion INTRAVENOUS    . phenylephrine (NEO-SYNEPHRINE) Adult infusion 30  mcg/min (08/07/14 1109)      Time spent: 35 minutes    Adriaan Maltese  [facility] Pager [phone]. If 7PM-7AM, please contact night-coverage at [email or web address], password [facility] 08/07/2014, 11:20 AM  LOS: 4 days

## 2014-08-07 NOTE — Progress Notes (Signed)
PT Cancellation Note  Patient Details Name: Patricia Mosley MRN: [id number] DOB: 1933/12/25   Cancelled Treatment:     Per RN pt unable to tolerate Physical Therapy today due to multiple medical issues.  Will follow up the next day or so.   Felecia Shelling  PTA [facility]  Acute  Rehab Pager      [phone]

## 2014-08-07 NOTE — Procedures (Signed)
Intubation Procedure Note GENICE KIMBERLIN [id number] [birth year]  Procedure: Intubation Indications: Respiratory insufficiency  Procedure Details Consent: Risks of procedure as well as the alternatives and risks of each were explained to the (patient/caregiver).  Consent for procedure obtained. Time Out: Verified patient identification, verified procedure, site/side was marked, verified correct patient position, special equipment/implants available, medications/allergies/relevent history reviewed, required imaging and test results available.  Performed  Maximum sterile technique was used including antiseptics, cap, gloves, gown, hand hygiene and mask.  MAC    Evaluation Hemodynamic Status: BP stable throughout; O2 sats: stable throughout Patient's Current Condition: stable Complications: No apparent complications Patient did tolerate procedure well. Chest X-ray ordered to verify placement.  CXR: pending.   Kendall Flack Royal 08/07/2014

## 2014-08-08 ENCOUNTER — Inpatient Hospital Stay (HOSPITAL_COMMUNITY): Payer: Medicare Other

## 2014-08-08 LAB — CBC
HCT: 28.9 % — ABNORMAL LOW (ref 36.0–46.0)
HEMOGLOBIN: 9.1 g/dL — AB (ref 12.0–15.0)
MCH: 24.8 pg — ABNORMAL LOW (ref 26.0–34.0)
MCHC: 31.5 g/dL (ref 30.0–36.0)
MCV: 78.7 fL (ref 78.0–100.0)
Platelets: 552 10*3/uL — ABNORMAL HIGH (ref 150–400)
RBC: 3.67 MIL/uL — AB (ref 3.87–5.11)
RDW: 14.3 % (ref 11.5–15.5)
WBC: 33.5 10*3/uL — ABNORMAL HIGH (ref 4.0–10.5)

## 2014-08-08 LAB — GLUCOSE, CAPILLARY
GLUCOSE-CAPILLARY: 219 mg/dL — AB (ref 70–99)
GLUCOSE-CAPILLARY: 238 mg/dL — AB (ref 70–99)
GLUCOSE-CAPILLARY: 253 mg/dL — AB (ref 70–99)
Glucose-Capillary: 148 mg/dL — ABNORMAL HIGH (ref 70–99)
Glucose-Capillary: 114 mg/dL — ABNORMAL HIGH (ref 70–99)
Glucose-Capillary: 119 mg/dL — ABNORMAL HIGH (ref 70–99)

## 2014-08-08 LAB — BASIC METABOLIC PANEL
ANION GAP: 13 (ref 5–15)
BUN: 23 mg/dL (ref 6–23)
CHLORIDE: 94 meq/L — AB (ref 96–112)
CO2: 21 meq/L (ref 19–32)
CREATININE: 0.65 mg/dL (ref 0.50–1.10)
Calcium: 8.6 mg/dL (ref 8.4–10.5)
GFR calc Af Amer: >90 mL/min (ref >90)
GFR calc non Af Amer: 83 mL/min — ABNORMAL LOW (ref >90)
Glucose, Bld: 255 mg/dL — ABNORMAL HIGH (ref 70–99)
Potassium: 5 mEq/L (ref 3.7–5.3)
Sodium: 128 mEq/L — ABNORMAL LOW (ref 137–147)

## 2014-08-08 LAB — PNEUMOCYSTIS JIROVECI SMEAR BY DFA: Pneumocystis jiroveci Ag: NEGATIVE

## 2014-08-08 LAB — BLOOD GAS, ARTERIAL
Acid-base deficit: 2.6 mmol/L — ABNORMAL HIGH (ref 0.0–2.0)
Bicarbonate: 22.7 mEq/L (ref 20.0–24.0)
DRAWN BY: 276051
FIO2: 0.6 %
LHR: 16 {breaths}/min
MECHVT: 390 mL
O2 SAT: 92.5 %
PEEP/CPAP: 10 cmH2O
PH ART: 7.331 — AB (ref 7.350–7.450)
PO2 ART: 69.1 mmHg — AB (ref 80.0–100.0)
Patient temperature: 98.6
TCO2: 21.6 mmol/L (ref 0–100)
pCO2 arterial: 44.1 mmHg (ref 35.0–45.0)

## 2014-08-08 LAB — LEGIONELLA ANTIGEN, URINE

## 2014-08-08 LAB — VANCOMYCIN, TROUGH: VANCOMYCIN TR: 8 ug/mL — AB (ref 10.0–20.0)

## 2014-08-08 MED ORDER — VANCOMYCIN HCL IN DEXTROSE 1-5 GM/200ML-% IV SOLN
1000.0000 mg | Freq: Two times a day (BID) | INTRAVENOUS | Status: DC
  Administered 2014-08-09 – 2014-08-10 (×4): 1000 mg via INTRAVENOUS
  Filled 2014-08-08 (×4): qty 200

## 2014-08-08 MED ORDER — SODIUM CHLORIDE 0.9 % IV BOLUS (SEPSIS)
500.0000 mL | Freq: Once | INTRAVENOUS | Status: AC
  Administered 2014-08-08: 500 mL via INTRAVENOUS

## 2014-08-08 MED ORDER — PRO-STAT SUGAR FREE PO LIQD
30.0000 mL | Freq: Every morning | ORAL | Status: DC
  Administered 2014-08-10 – 2014-08-11 (×2): 30 mL
  Filled 2014-08-08 (×5): qty 30

## 2014-08-08 MED ORDER — INSULIN ASPART 100 UNIT/ML ~~LOC~~ SOLN
0.0000 [IU] | SUBCUTANEOUS | Status: DC
  Administered 2014-08-08: 3 [IU] via SUBCUTANEOUS
  Administered 2014-08-08: 11 [IU] via SUBCUTANEOUS
  Administered 2014-08-09: 3 [IU] via SUBCUTANEOUS
  Administered 2014-08-09 (×2): 4 [IU] via SUBCUTANEOUS
  Administered 2014-08-10: 20 [IU] via SUBCUTANEOUS
  Administered 2014-08-10: 4 [IU] via SUBCUTANEOUS
  Administered 2014-08-10: 11 [IU] via SUBCUTANEOUS
  Administered 2014-08-10 (×2): 3 [IU] via SUBCUTANEOUS
  Administered 2014-08-10: 4 [IU] via SUBCUTANEOUS
  Administered 2014-08-11 (×3): 20 [IU] via SUBCUTANEOUS
  Administered 2014-08-11: 15 [IU] via SUBCUTANEOUS
  Administered 2014-08-11: 20 [IU] via SUBCUTANEOUS

## 2014-08-08 MED ORDER — FAMOTIDINE IN NACL 20-0.9 MG/50ML-% IV SOLN
20.0000 mg | Freq: Two times a day (BID) | INTRAVENOUS | Status: DC
  Administered 2014-08-08 – 2014-08-11 (×7): 20 mg via INTRAVENOUS
  Filled 2014-08-08 (×7): qty 50

## 2014-08-08 MED ORDER — VANCOMYCIN HCL 500 MG IV SOLR
500.0000 mg | Freq: Once | INTRAVENOUS | Status: AC
  Administered 2014-08-08: 500 mg via INTRAVENOUS
  Filled 2014-08-08: qty 500

## 2014-08-08 MED ORDER — SODIUM CHLORIDE 0.9 % IV SOLN
INTRAVENOUS | Status: DC
  Filled 2014-08-08: qty 2.5

## 2014-08-08 MED ORDER — SODIUM CHLORIDE 0.9 % IV SOLN
INTRAVENOUS | Status: DC
  Administered 2014-08-08 – 2014-08-10 (×6): via INTRAVENOUS

## 2014-08-08 MED ORDER — INSULIN GLARGINE 100 UNIT/ML ~~LOC~~ SOLN
20.0000 [IU] | Freq: Every day | SUBCUTANEOUS | Status: DC
  Administered 2014-08-08 – 2014-08-10 (×3): 20 [IU] via SUBCUTANEOUS
  Filled 2014-08-08 (×4): qty 0.2

## 2014-08-08 NOTE — Procedures (Signed)
Central Venous Catheter Insertion Procedure Note RAKISHA PINCOCK [id number] July 03, 1934  Procedure: Insertion of Central Venous Catheter  Indications: Assessment of intravascular volume, Drug and/or fluid administration and Frequent blood sampling  Procedure Details Consent: Risks of procedure as well as the alternatives and risks of each were explained to the (patient/caregiver).  Consent for procedure obtained.   Time Out: Verified patient identification, verified procedure, site/side was marked, verified correct patient position, special equipment/implants available, medications/allergies/relevent history reviewed, required imaging and test results available.  Performed  Maximum sterile technique was used including antiseptics, cap, gloves, gown, hand hygiene, mask and sheet. Skin prep: Chlorhexidine; local anesthetic administered A  triple lumen catheter was placed in the left internal jugular vein to 18 cm (pt 5'4) using the Seldinger technique, sutured x3.  Evaluation Blood flow good Complications: No apparent complications Patient did tolerate procedure well. Chest X-ray ordered to verify placement.  CXR: pending.   Procedure performed under direct supervision of Dr. Vassie Loll and with ultrasound guidance for real time vessel cannulation.      Canary Brim, NP-C [facility] Pgr: [phone] or [phone]  Oretha Milch MD  08/08/2014, 12:01 PM

## 2014-08-08 NOTE — Progress Notes (Signed)
PULMONARY / CRITICAL CARE MEDICINE   Name: Patricia Mosley MRN: [id number] DOB: Aug 20, 1934    ADMISSION DATE:  08/20/2014 CONSULTATION DATE:  08/07/14  REFERRING MD :  Dr. Gonzella Lex   CHIEF COMPLAINT:  Hypoxic Respiratory Failure   INITIAL PRESENTATION: 80 y/o F admitted 11/5 with cough & generalized weakness.  Work up concerning for R CAP and patient admitted for evaluation.  Patient had progressive hypoxemia and PCCM consulted for evaluation.    STUDIES:  11/07  CT Head >> no acute abnormality, chronic microvascular ischemic white matter disease  11/08  CTA Chest >> no PE, mod bilateral multifocal airspace disease, small B pleural effusions, mod-lg hiatal hernia  SIGNIFICANT EVENTS: 11/05  Admit per [facility] with CAP, Rx'd as outpatient with azithro & increase in baseline prednisone  11/07  N/V 11/08  Development of worsening R sided airspace disease, CTA chest neg for Pe 11/09  Decompensated, hypoxic requiring intubation 11/10  Improved oxygenation on ABG, CXR with improved airspace disease    SUBJECTIVE: RN reports pt remains on 25 mcg neo, fent gtt at , tolerating TF Afebrile Poor UO  VITAL SIGNS: Temp:  [97.6 F (36.4 C)-98.8 F (37.1 C)] 97.6 F (36.4 C) (11/10 0800) Pulse Rate:  [59-99] 61 (11/10 0816) Resp:  [16-27] 22 (11/10 0816) BP: (75-148)/(37-95) 101/55 mmHg (11/10 0816) SpO2:  [74 %-100 %] 93 % (11/10 0816) FiO2 (%):  [60 %-100 %] 60 % (11/10 0816) Weight:  [147 lb 14.9 oz (67.1 kg)] 147 lb 14.9 oz (67.1 kg) (11/10 0400)   HEMODYNAMICS:     VENTILATOR SETTINGS: Vent Mode:  [-] PRVC FiO2 (%):  [60 %-100 %] 60 % Set Rate:  [22 bmp] 22 bmp Vt Set:  [500 mL] 500 mL PEEP:  [8 cmH20] 8 cmH20 Plateau Pressure:  [23 cmH20-26 cmH20] 24 cmH20   INTAKE / OUTPUT:  Intake/Output Summary (Last 24 hours) at 08/08/14 4496 Last data filed at 08/08/14 0630  Gross per 24 hour  Intake 2311.06 ml  Output    480 ml  Net 1831.06 ml    PHYSICAL  EXAMINATION: General:  Frail elderly female in NAD Neuro:  Intermittent agitation on WUA, RASS 0 to +2, opens eyes to voice, MAE  HEENT:  OETT, Edentulous, tongue coated w/ white/brown plaque Cardiovascular:  s1s2 rrr, no m/r/g  Lungs:  resp's even/non-labored, lungs bilaterally coarse Abdomen:  Round/soft, bsx4 active  Musculoskeletal:  Finger / toe deformities consistent with RA Skin:  Warm/dry, R eye facial laceration with steri-strip's c/d/i  LABS:  CBC  Recent Labs Lab 08/05/14 0909 08/06/14 0525 08/07/14 0355  WBC 16.5* 18.7* 25.2*  HGB 9.8* 9.5* 11.3*  HCT 31.1* 29.8* 35.0*  PLT 558* 571* 438*   Coag's No results for input(s): APTT, INR in the last 168 hours.   BMET  Recent Labs Lab 08/04/14 0300 08/05/14 0909 08/07/14 0355  NA 131* 134* 129*  K 4.5 4.0 5.3  CL 96 97 90*  CO2 24 26 25   BUN 10 11 14   CREATININE 0.67 0.72 0.64  GLUCOSE 151* 146* 161*   Electrolytes  Recent Labs Lab 08/04/14 0300 08/05/14 0909 08/07/14 0355  CALCIUM 8.5 8.7 9.5  MG  --   --  2.0   Sepsis Markers  Recent Labs Lab 08/27/2014 1339  LATICACIDVEN 1.31   ABG  Recent Labs Lab 08/06/14 1006 08/07/14 0750 08/07/14 1200  PHART 7.400 7.421 7.390  PCO2ART 42.0 39.4 40.4  PO2ART 50.8* 56.0* 178.0*   Liver Enzymes  Recent Labs Lab 08/04/14 0300  AST 19  ALT 17  ALKPHOS 65  BILITOT 0.3  ALBUMIN 2.1*   Cardiac Enzymes  Recent Labs Lab 08/01/2014 2100 08/04/14 0300 08/05/14 0909  TROPONINI <0.30 <0.30 <0.30   Glucose  Recent Labs Lab 08/07/14 1156 08/07/14 1558 08/07/14 1935 08/07/14 2357 08/08/14 0401 08/08/14 0754  GLUCAP 155* 206* 167* 238* 219* 253*    Imaging Dg Chest Port 1 View  08/07/2014   CLINICAL DATA:  Post intubation  EXAM: PORTABLE CHEST - 1 VIEW  COMPARISON:  CT chest 08/06/2014  FINDINGS: Endotracheal tube with the tip 2 cm above the carina. Orogastric tube with the distal portion excluded from the field of view. The proximal port  is above the gastroesophageal junction; recommend advancing the tube at least 10 cm.  Bilateral interstitial and alveolar airspace opacities throughout bilateral lung fields. Trace bilateral pleural effusions. The heart and mediastinal contours are unremarkable.  The osseous structures are unremarkable.  IMPRESSION: 1. Endotracheal tube with the tip 2 cm above the carina. 2. Bilateral diffuse interstitial and alveolar airspace opacities with trace bilateral pleural effusions. Differential diagnosis includes pulmonary edema versus multi lobar pneumonia versus ARDS. 3. Orogastric tube with the distal portion excluded from the field of view. The proximal port is above the gastroesophageal junction; recommend advancing the tube at least 10 cm.   Electronically Signed   By: Elige Ko   On: 08/07/2014 11:39     ASSESSMENT / PLAN:  PULMONARY OETT 11/9 >>  A: Acute Hypoxic Respiratory Failure - PaO2/FiO2 ratio consistent with ARDS Diffuse Bilateral Airspace Disease - DDx includes aspiration (hiatal hernia + vomiting), viral / bacterial etiology, opportunistic infection / MAI (plaquenil + prednisone 7.5mg ), viral pneumonitis, rheumatoid interstitial disease.   Respiratory Alkalosis  Small Bilateral Pleural Effusions  P:   Trend airspace disease Follow respiratory studies, see ID Q3 PRN Albuterol  Consider diagnostic sampling of pleural fluid if enlarging  ARDS protocol -lower Tv  CARDIOVASCULAR CVL  A:  Tachycardia - compensatory P:  Continue ICU monitoring  Wean Neo gtt for MAP > 65 NS @ 75  NS bolus 500 ml x1  Place CVL  RENAL A:   Mild Hyperkalemia  P:   Trend BMP Follow up BMP assessment 11/10 Replace electrolytes as indicated   GASTROINTESTINAL A:   Large Paraesophageal Hernia Protein Calorie Malnutrition P:   Pepcid for SUP On protonix + pepcid at home, hold protonix with abx & C-diff risk Advance OGT Continue TF, monitor residuals with hernia   HEMATOLOGIC A:    Leukocytosis  Mild Anemia  P:  Trend CBC Heparin for DVT prophylaxis   INFECTIOUS A:   Bilateral Diffuse Infiltrates  P:   BCx2 11/9 >> Sputum 11/9 >>  RVP 11/9 >>  PCP 11/9 >>  Strep Pneumo 11/9 >> neg Abx: Azithro, start date 11/6 x 1 dose Abx: Rocephin, start date 11/5, x1 dose  Abx: Vanco, start date 11/8, day 2/x Abx: Cefepime, start date 11/8, day 2/x  ENDOCRINE A:   DM II P:   SSI Lantus 8 units Monitor glucose trend with steroids  NEUROLOGIC A:   Fibromyalgia  Rheumatoid Arthritis - on plaquenil + prednisone 5 mg at baseline Pain - ICU discomfort + ? Fibro flare Mechanical Fall P:   RASS goal: 0 Fentanyl PAD protocol  Continue stress steroids    FAMILY  - Updates: Await family arrival 11/10 am  - Inter-disciplinary family meet or Palliative Care meeting due by:  Canary Brim, NP-C [facility] Pgr: [phone] or [phone]   ATTENDING NOTE: I have personally reviewed patient's available data, including medical history, events of note, physical examination and test results as part of my evaluation. I have discussed with resident/NP and other careteam providers such as pharmacist, RN and RRT & co-ordinated with consultants. In addition, I personally evaluated patient and elicited key history of aspiration pneumonia ,severe hypoxia requiring PEEP c/w ARDS , falling UOexam findings of agitation on WUA,, acceptable plateau pressure on vent & labs showing hyponatremia & worsening leucocytosis. This is c/w ARDS & early AKI now on pressors s/o septic shock - Infiltrates appear improved on CXR but this may be effect of PEEP. Will place on ARDS protocol -lower Tv to 7cc/kg , place CVL for pressors & CVP monitoring to guide fluids, increase lantus to 20 U & place on resistant SSI Rest per NP/medical resident whose note is outlined above and that I agree with and edited in full.   The patient is critically ill with multiple organ systems failure  and requires high complexity decision making for assessment and support, frequent evaluation and titration of therapies, application of advanced monitoring technologies and extensive interpretation of multiple databases. Critical Care Time devoted to patient care services described in this note is 40 minutes.   Cyril Mourning MD. Tonny Bollman. [facility] Pager [phone] If no response call [phone]   08/08/2014, 8:26 AM

## 2014-08-08 NOTE — Progress Notes (Signed)
ANTIBIOTIC CONSULT NOTE  Pharmacy Consult for vancomycin/cefepime Indication: pneumonia  Allergies  Allergen Reactions  . Acetaminophen     Ineffective.   . Adalimumab     "Sinking spells."  . Allegra [Fexofenadine]     "Serum sickness."   . Allopurinol     "Serum sickness."   . Boniva [Ibandronic Acid]     Oral tablet causes chest pain. IV Boniva OK.    . Ciprofloxacin     "Pains in legs that may have been some mild tendonitis. She could maybe take it again."   . Codeine     Auditory and visual hallucinations.   . Dextromethorphan Nausea And Vomiting  . Enbrel [Etanercept]     "Sinking spells."  . Epinephrine     Unknown.    . Estrogens     "Felt sick."   . Forteo [Teriparatide (Recombinant)]     "Felt weird."   . Gabapentin     Unknown.    . Infliximab     Unknown.    Barbera Setters [Levofloxacin]     "Bilateral inflamed achilles tendons."   . Levemir [Insulin Detemir] Itching  . Meperidine Hcl     Unknown.   . Methocarbamol     Unknown.   . Methotrexate     Unknown.    . Other     "Eye dilating medicine." "Makes her 'goofy.'"   . Penicillins     Unknown.   Marland Kitchen Pentazocine Lactate     Unknown.    . Quinine Derivatives Diarrhea and Nausea And Vomiting  . Qvar [Beclomethasone]     "Made throat hurt worse and maybe made her GERD worse so stopped after 4 days."  . Risedronate Sodium     Tolerates IV boniva  . Robitussin (Alcohol Free) [Guaifenesin]     "Made sick."   . Sulfonamide Derivatives     Unknown.   . Tetracycline     Unknown.    . Tramadol Other (See Comments)    Hallucinations   . Doxycycline Diarrhea  . Metronidazole Rash  . Vitamin D2 [Ergocalciferol] Rash    GI upset.     Patient Measurements: Height: 5\' 5"  (165.1 cm) Weight: 147 lb 14.9 oz (67.1 kg) IBW/kg (Calculated) : 57 Adjusted Body Weight:   Vital Signs: Temp: 97.6 F (36.4 C) (11/10 0800) Temp Source: Axillary (11/10 0800) BP: 105/55 mmHg (11/10 1200) Pulse Rate: 65 (11/10  1200) Intake/Output from previous day: 11/09 0701 - 11/10 0700 In: 2421.1 [I.V.:1393.4; NG/GT:637.7; IV Piggyback:300] Out: 480 [Urine:480] Intake/Output from this shift: Total I/O In: 3 [I.V.:3] Out: -   Labs:  Recent Labs  08/06/14 0525 08/07/14 0355 08/08/14 0820  WBC 18.7* 25.2* 33.5*  HGB 9.5* 11.3* 9.1*  PLT 571* 438* 552*  CREATININE  --  0.64 0.65   Estimated Creatinine Clearance: 52.2 mL/min (by C-G formula based on Cr of 0.65). No results for input(s): VANCOTROUGH, VANCOPEAK, VANCORANDOM, GENTTROUGH, GENTPEAK, GENTRANDOM, TOBRATROUGH, TOBRAPEAK, TOBRARND, AMIKACINPEAK, AMIKACINTROU, AMIKACIN in the last 72 hours.   Microbiology: Recent Results (from the past 720 hour(s))  Culture, blood (routine x 2)     Status: None (Preliminary result)   Collection Time: 08/07/14  8:00 AM  Result Value Ref Range Status   Specimen Description BLOOD LEFT HAND  Final   Special Requests BOTTLES DRAWN AEROBIC ONLY 2 CC  Final   Culture  Setup Time   Final    08/07/2014 14:12 Performed at 13/05/2014  Final           BLOOD CULTURE RECEIVED NO GROWTH TO DATE CULTURE WILL BE HELD FOR 5 DAYS BEFORE ISSUING A FINAL NEGATIVE REPORT Performed at [facility]    Report Status PENDING  Incomplete  Culture, blood (routine x 2)     Status: None (Preliminary result)   Collection Time: 08/07/14 11:00 AM  Result Value Ref Range Status   Specimen Description BLOOD LEFT HAND  Final   Special Requests BOTTLES DRAWN AEROBIC AND ANAEROBIC 6 CC EACH  Final   Culture  Setup Time   Final    08/07/2014 14:12 Performed at [facility]    Culture   Final           BLOOD CULTURE RECEIVED NO GROWTH TO DATE CULTURE WILL BE HELD FOR 5 DAYS BEFORE ISSUING A FINAL NEGATIVE REPORT Performed at [facility]    Report Status PENDING  Incomplete  Pneumocystis smear by DFA     Status: None   Collection Time: 08/08/14  1:22 AM  Result Value Ref Range Status    Specimen Source-[facility] NASOPHARYNGEAL  Final   Pneumocystis jiroveci Ag NEGATIVE  Final    Comment: Performed at [facility] SPUTOLYSIN ADDED     Medical History: Past Medical History  Diagnosis Date  . Diabetes mellitus     "prednisone induced" per patient  . Osteoporosis   . Rheumatoid arthritis(714.0)   . Fibromyalgia    Assessment: 11 YOF admitted 11/5 for weakness and cough, placed on ceftriaxone and azithromycin  for CAP.  She was transferred to ICU 11/8 for hypoxia. CXR with worsening pneumonia. She has multiple drug allergies/intolerances, including PCN but has tolerated ceftriaxone.  Pharmacy consulted to dose Vancomycin and Cefepime for pneumonia  11/5 >> azithromycin  >> 11/8 11/5 >> ceftriaxone  >>  11/8 11/8 >>vancomycin  >> 11/8 >> cefepime >>    Tmax: afebrile WBCs: WBC elevated/worsening 33.5 today (on chronic steroids) Renal: SCr WNL, normalized CrCl = 82ml/min (using Scr = 0.8)  11/9: influenza: sent 11/9: respiratory/trach culture: sent 11/9: blood x 2: ngtd 11/10 pneumocystis jiroveci Ag: negative 11/9: legionella/ strep pneumo: neg  Goal of Therapy:  Vancomycin trough level 15-20 mcg/ml  Plan:   Continue Vancomycin 500mg  IV q12h  Check prior to 2000 dose tonight  Monitor renal function  Continue Cefepime 1gm IV q8h  F/u culture results   , PharmD Pager: [phone] 08/08/2014 1:17 PM

## 2014-08-08 NOTE — Progress Notes (Signed)
OT Cancellation Note  Patient Details Name: Patricia Mosley MRN: [id number] DOB: 1934/04/26   Cancelled Treatment:    Reason Eval/Treat Not Completed: Medical issues which prohibited therapy. Pt now intubated. Will sign off for therapy. Please reorder if pt becomes appropriate for PT/OT  Lennox Laity  [phone] 08/08/2014, 8:25 AM

## 2014-08-08 NOTE — Progress Notes (Signed)
Spoke with E-link nurse about current NG tube status. Tube can be seen "curled" in the patients mouth. After speaking with MD on call orders were given to remove the current NG tube. Bosie Helper, RN

## 2014-08-08 NOTE — Progress Notes (Signed)
PT Cancellation Note  Patient Details Name: Patricia Mosley MRN: [id number] DOB: 20-Jun-1934   Cancelled Treatment:    Reason Eval/Treat Not Completed: Medical issues which prohibited therapy (on vent)   Rada Hay 08/08/2014, 8:26 AM Blanchard Kelch PT [phone]

## 2014-08-08 NOTE — Progress Notes (Signed)
Palliative Medicine Team Progress Note  The palliative medicine team is currently shadowing Ms. Delon's chart for needs and the events over the past two days have been reviewed. Initial consult on 11/8. We are available to support this patient and family through her critical illness and evolving goals of care and what I anticipate will be a difficult symptom management case if she improves and is able to come off the ventilator successfully. We will check in with the patient and her family as needed- if they remain open to our assistance and involvement. For any urgent needs or concerns please call the PMT phone at [phone].  Anderson Malta, DO Palliative Medicine [phone]

## 2014-08-08 NOTE — Progress Notes (Signed)
NGT placement reviewed, ok to resume TF.  Monitor for aspiration / high residuals.   TLC ok for use.     Canary Brim, NP-C  Pulmonary & Critical Care Pgr: [phone] or [phone]

## 2014-08-08 NOTE — Progress Notes (Addendum)
NG tube can be seen curled in patients mouth. Notified Pryor Montes, NP. Will continue to hold tube medications and feeds at this time and continue to monitor. Bosie Helper, RN

## 2014-08-08 NOTE — Progress Notes (Signed)
Noted TF colored secretions in subglottic suction.  TF placed on hold.  NGT advanced in early am.  Reassessed and noted the NGT to be coiled in the mouth.  NGT pulled back 5 cm - to the point could no longer see in the mouth.  Residuals 20 ml.    Plan: Hold TF  Assess KUB now for placement.  May need fluoro guided placement of feeding tube   Patricia Brim, NP-C [facility] Pgr: [phone] or [phone]

## 2014-08-08 NOTE — Progress Notes (Signed)
Spoke with radiologist about options for feeding tube placement. Advised him to call E-link to determine best option for patient. Bosie Helper, RN

## 2014-08-08 NOTE — Progress Notes (Signed)
Pharmacy - Vancomycin  11/10 Vanc trough at 1900 = 8 mg/l on Vanc 500mg  Q12H. Also, the sample was drawn a little early ~10hrs after previous dose.   Plan: Increase Vanc to 1g IV q12h.  , PharmD, pager [phone]. 08/08/2014,8:03 PM.

## 2014-08-09 ENCOUNTER — Inpatient Hospital Stay (HOSPITAL_COMMUNITY): Payer: Medicare Other

## 2014-08-09 DIAGNOSIS — R6521 Severe sepsis with septic shock: Secondary | ICD-10-CM

## 2014-08-09 DIAGNOSIS — A419 Sepsis, unspecified organism: Secondary | ICD-10-CM

## 2014-08-09 DIAGNOSIS — J690 Pneumonitis due to inhalation of food and vomit: Principal | ICD-10-CM

## 2014-08-09 LAB — BLOOD GAS, ARTERIAL
Acid-base deficit: 3 mmol/L — ABNORMAL HIGH (ref 0.0–2.0)
Acid-base deficit: 5 mmol/L — ABNORMAL HIGH (ref 0.0–2.0)
Bicarbonate: 22.2 mEq/L (ref 20.0–24.0)
Bicarbonate: 20.9 mEq/L (ref 20.0–24.0)
DRAWN BY: 232811
Drawn by: 232811
FIO2: 0.6 %
FIO2: 0.7 %
LHR: 18 {breaths}/min
MECHVT: 460 mL
O2 SAT: 91.2 %
O2 Saturation: 77.8 %
PATIENT TEMPERATURE: 98.2
PCO2 ART: 44.9 mmHg (ref 35.0–45.0)
PEEP: 10 cmH2O
PEEP: 10 cmH2O
PH ART: 7.33 — AB (ref 7.350–7.450)
PO2 ART: 44.7 mmHg — AB (ref 80.0–100.0)
Patient temperature: 98.1
RATE: 18 resp/min
TCO2: 21.3 mmol/L (ref 0–100)
TCO2: 20.1 mmol/L (ref 0–100)
VT: 340 mL
pCO2 arterial: 43.3 mmHg (ref 35.0–45.0)
pH, Arterial: 7.287 — ABNORMAL LOW (ref 7.350–7.450)
pO2, Arterial: 62.6 mmHg — ABNORMAL LOW (ref 80.0–100.0)

## 2014-08-09 LAB — BASIC METABOLIC PANEL
Anion gap: 10 (ref 5–15)
BUN: 16 mg/dL (ref 6–23)
CALCIUM: 8.4 mg/dL (ref 8.4–10.5)
CO2: 23 meq/L (ref 19–32)
CREATININE: 0.61 mg/dL (ref 0.50–1.10)
Chloride: 103 mEq/L (ref 96–112)
GFR calc Af Amer: >90 mL/min (ref >90)
GFR calc non Af Amer: 85 mL/min — ABNORMAL LOW (ref >90)
GLUCOSE: 119 mg/dL — AB (ref 70–99)
Potassium: 4.7 mEq/L (ref 3.7–5.3)
Sodium: 136 mEq/L — ABNORMAL LOW (ref 137–147)

## 2014-08-09 LAB — RESPIRATORY VIRUS PANEL
Adenovirus: NOT DETECTED
Influenza A H1: NOT DETECTED
Influenza A H3: NOT DETECTED
Influenza A: NOT DETECTED
Influenza B: NOT DETECTED
METAPNEUMOVIRUS: NOT DETECTED
PARAINFLUENZA 1 A: NOT DETECTED
PARAINFLUENZA 3 A: NOT DETECTED
Parainfluenza 2: NOT DETECTED
Respiratory Syncytial Virus A: NOT DETECTED
Respiratory Syncytial Virus B: NOT DETECTED
Rhinovirus: NOT DETECTED

## 2014-08-09 LAB — GLUCOSE, CAPILLARY
GLUCOSE-CAPILLARY: 119 mg/dL — AB (ref 70–99)
GLUCOSE-CAPILLARY: 120 mg/dL — AB (ref 70–99)
Glucose-Capillary: 112 mg/dL — ABNORMAL HIGH (ref 70–99)
Glucose-Capillary: 112 mg/dL — ABNORMAL HIGH (ref 70–99)
Glucose-Capillary: 133 mg/dL — ABNORMAL HIGH (ref 70–99)
Glucose-Capillary: 155 mg/dL — ABNORMAL HIGH (ref 70–99)
Glucose-Capillary: 176 mg/dL — ABNORMAL HIGH (ref 70–99)

## 2014-08-09 LAB — CBC
HEMATOCRIT: 28.1 % — AB (ref 36.0–46.0)
Hemoglobin: 8.9 g/dL — ABNORMAL LOW (ref 12.0–15.0)
MCH: 25.1 pg — AB (ref 26.0–34.0)
MCHC: 31.7 g/dL (ref 30.0–36.0)
MCV: 79.4 fL (ref 78.0–100.0)
Platelets: 495 10*3/uL — ABNORMAL HIGH (ref 150–400)
RBC: 3.54 MIL/uL — ABNORMAL LOW (ref 3.87–5.11)
RDW: 14.6 % (ref 11.5–15.5)
WBC: 31.2 10*3/uL — AB (ref 4.0–10.5)

## 2014-08-09 MED ORDER — MIDAZOLAM HCL 2 MG/2ML IJ SOLN
INTRAMUSCULAR | Status: AC
  Administered 2014-08-09: 2 mg
  Filled 2014-08-09: qty 2

## 2014-08-09 MED ORDER — MIDAZOLAM BOLUS VIA INFUSION
1.0000 mg | INTRAVENOUS | Status: DC | PRN
  Filled 2014-08-09: qty 2

## 2014-08-09 MED ORDER — SODIUM CHLORIDE 0.9 % IV SOLN
0.0000 mg/h | INTRAVENOUS | Status: DC
  Administered 2014-08-09: 2 mg/h via INTRAVENOUS
  Administered 2014-08-10 – 2014-08-11 (×4): 4 mg/h via INTRAVENOUS
  Filled 2014-08-09 (×5): qty 10

## 2014-08-09 NOTE — Progress Notes (Signed)
PT Cancellation Note  Patient Details Name: Patricia Mosley MRN: [id number] DOB: [birth year]   Cancelled Treatment:    Reason Eval/Treat Not Completed: Medical issues which prohibited therapy (on vent, needs further sedation meds)   Gar Glance,KATHrine E 08/09/2014, 11:38 AM Zenovia Jarred, PT, DPT 08/09/2014 Pager: [phone]

## 2014-08-09 NOTE — Progress Notes (Signed)
Dr Delton Coombes notified of recent abg drawn, showing po2 of 44 on 70%, +10peep.  MD aware that pt now on 80% fio2, +14peep.  sats now 88-90%.

## 2014-08-09 NOTE — Progress Notes (Signed)
NUTRITION FOLLOW UP  Intervention:   -Once NGT placed endoscopically:  -Initiate Oxepa @ 40 ml/hr via NGT  -30 ml Prostat once daily.    -Tube feeding regimen provides 1540 kcal (100% of needs), 75 grams of protein, and 758 ml of H2O.  -RD to follow  Nutrition Dx:   Inadequate oral intake related to inability to eat as evidenced by npo status.; ongoing  Goal:   Patient to meet >90% estimated needs with enteral nutrtion  Monitor:   TF order/tolerance, total protein/energy intake, labs, weights  Assessment:   11/09: Patent with progressive respiratory failure, intubated this am. Admitted with CAP. Hyponatremia. Patient was eating about 50% of her meals earlier in the stay. Stable weiht around 140 lbs.   Las BM 11/5 and received rx from RN for this today. Oxepa at 20 ml/hr and tolerating well.  Patient is currently intubated on ventilator support MV: 11.3 L/min Temp (24hrs), Avg:98.8 F (37.1 C), Min:98.3 F (36.8 C), Max:99.5 F (37.5 C)  11/11: -Pt tolerating Oxepa at 40 ml/hr on 11/10.Minimal residuals of 20-40 ml reported. Had not yet received Pro-Stat. RD ordered 30 ml once daily via tube -Per review of RN notes, TF was later held d/t tube being "curled in mouth". NGT removed. -GI recommended NGT be replaced endoscopically d/t concerns for paraesophageal hernia. Plan for placement on 11/12   Height: Ht Readings from Last 1 Encounters:  08/08/14 5\' 5"  (1.651 m)    Weight Status:   Wt Readings from Last 1 Encounters:  08/09/14 154 lb 5.2 oz (70 kg)    Re-estimated needs:  Kcal: 1474 Protein: 70-80 gm  Fluid: >/=1.6L  Skin: WDL, + 1 RUE edema, +1 LUE edema  Diet Order: Diet NPO time specified   Intake/Output Summary (Last 24 hours) at 08/09/14 1432 Last data filed at 08/09/14 1110  Gross per 24 hour  Intake 2998.85 ml  Output    905 ml  Net 2093.85 ml    Last BM: 11/05   Labs:   Recent Labs Lab 08/07/14 0355 08/08/14 0820 08/09/14 0440   NA 129* 128* 136*  K 5.3 5.0 4.7  CL 90* 94* 103  CO2 25 21 23   BUN 14 23 16   CREATININE 0.64 0.65 0.61  CALCIUM 9.5 8.6 8.4  MG 2.0  --   --   GLUCOSE 161* 255* 119*    CBG (last 3)   Recent Labs  08/09/14 0350 08/09/14 0751 08/09/14 1218  GLUCAP 112* 133* 155*    Scheduled Meds: . antiseptic oral rinse  7 mL Mouth Rinse QID  . calcium-vitamin D  1 tablet Oral Daily  . ceFEPime (MAXIPIME) IV  1 g Intravenous 3 times per day  . chlorhexidine  15 mL Mouth Rinse BID  . famotidine (PEPCID) IV  20 mg Intravenous Q12H  . feeding supplement (PRO-STAT SUGAR FREE 64)  30 mL Per Tube q morning - 10a  . folic acid  1 mg Oral BID  . hydrocortisone sod succinate (SOLU-CORTEF) inj  50 mg Intravenous Q8H  . hydroxychloroquine  200 mg Oral BID  . insulin aspart  0-20 Units Subcutaneous 6 times per day  . insulin glargine  20 Units Subcutaneous Daily  . lip balm  1 application Topical BID  . magic mouthwash  15 mL Oral TID  . saccharomyces boulardii  250 mg Oral BID  . senna-docusate  2 tablet Oral BID  . sodium chloride  3 mL Intravenous Q12H  . vancomycin  1,000  mg Intravenous Q12H    Continuous Infusions: . sodium chloride 75 mL/hr at 08/09/14 1220  . fentaNYL infusion INTRAVENOUS 225 mcg/hr (08/09/14 0810)  . midazolam (VERSED) infusion 1 mg/hr (08/09/14 1250)  . phenylephrine (NEO-SYNEPHRINE) Adult infusion 25 mcg/min (08/09/14 0957)    Lloyd Huger MS RD LDN Clinical Dietitian Pager:[phone]

## 2014-08-09 NOTE — Progress Notes (Signed)
Dr. Cathi Roan at bedside because pt is agitated, desating,and not in sync with the vent

## 2014-08-09 NOTE — Progress Notes (Signed)
PULMONARY / CRITICAL CARE MEDICINE   Name: Patricia Mosley MRN: [id number] DOB: 1934/03/27    ADMISSION DATE:  08-15-2014 CONSULTATION DATE:  08/07/14  REFERRING MD :  Dr. Gonzella Lex   CHIEF COMPLAINT:  Hypoxic Respiratory Failure   INITIAL PRESENTATION: 80 y/o F admitted 11/5 with cough & generalized weakness.  Work up concerning for R CAP and patient admitted for evaluation.  Patient had progressive hypoxemia and PCCM consulted , required mech ventilation    STUDIES:  11/07  CT Head >> no acute abnormality, chronic microvascular ischemic white matter disease  11/08  CTA Chest >> no PE, mod bilateral multifocal airspace disease, small B pleural effusions, mod-lg hiatal hernia  SIGNIFICANT EVENTS: 11/05  Admit per [facility] with CAP, Rx'd as outpatient with azithro & increase in baseline prednisone  11/07  N/V 11/08  Development of worsening R sided airspace disease, CTA chest neg for Pe 11/09  Decompensated, hypoxic requiring intubation 11/10  Improved oxygenation on ABG, CXR with improved airspace disease    SUBJECTIVE: Severe agitation on WUA- causing vent asynchrony & desaturation Afebrile UO picked up  VITAL SIGNS: Temp:  [96.4 F (35.8 C)-98.4 F (36.9 C)] 98 F (36.7 C) (11/11 0800) Pulse Rate:  [57-130] 130 (11/11 0817) Resp:  [9-25] 18 (11/11 0817) BP: (95-119)/(38-58) 110/52 mmHg (11/11 0600) SpO2:  [79 %-99 %] 89 % (11/11 0817) FiO2 (%):  [60 %] 60 % (11/11 0817) Weight:  [154 lb 5.2 oz (70 kg)] 154 lb 5.2 oz (70 kg) (11/11 0447)   HEMODYNAMICS: CVP:  [9 mmHg-16 mmHg] 10 mmHg   VENTILATOR SETTINGS: Vent Mode:  [-] PRVC FiO2 (%):  [60 %] 60 % Set Rate:  [16 bmp-18 bmp] 18 bmp Vt Set:  [340 mL-460 mL] 460 mL PEEP:  [10 cmH20] 10 cmH20 Plateau Pressure:  [14 cmH20-25 cmH20] 21 cmH20   INTAKE / OUTPUT:  Intake/Output Summary (Last 24 hours) at 08/09/14 0942 Last data filed at 08/09/14 7342  Gross per 24 hour  Intake 2817.46 ml  Output   1150 ml  Net 1667.46 ml     PHYSICAL EXAMINATION: General:  Frail elderly female in NAD Neuro:  Intermittent agitation on WUA, RASS 0 to +2, opens eyes to voice, MAE  HEENT:  OETT, Edentulous, tongue coated w/ white/brown plaque Cardiovascular:  s1s2 rrr, no m/r/g  Lungs:  resp's even/non-labored, lungs bilaterally coarse Abdomen:  Round/soft, bsx4 active  Musculoskeletal:  Finger / toe deformities consistent with RA Skin:  Warm/dry, R eye facial laceration with steri-strip's c/d/i  LABS:  CBC  Recent Labs Lab 08/07/14 0355 08/08/14 0820 08/09/14 0440  WBC 25.2* 33.5* 31.2*  HGB 11.3* 9.1* 8.9*  HCT 35.0* 28.9* 28.1*  PLT 438* 552* 495*   Coag's No results for input(s): APTT, INR in the last 168 hours.   BMET  Recent Labs Lab 08/07/14 0355 08/08/14 0820 08/09/14 0440  NA 129* 128* 136*  K 5.3 5.0 4.7  CL 90* 94* 103  CO2 25 21 23   BUN 14 23 16   CREATININE 0.64 0.65 0.61  GLUCOSE 161* 255* 119*   Electrolytes  Recent Labs Lab 08/07/14 0355 08/08/14 0820 08/09/14 0440  CALCIUM 9.5 8.6 8.4  MG 2.0  --   --    Sepsis Markers  Recent Labs Lab 08-15-2014 1339  LATICACIDVEN 1.31   ABG  Recent Labs Lab 08/07/14 1200 08/08/14 1800 08/09/14 0135  PHART 7.390 7.331* 7.330*  PCO2ART 40.4 44.1 43.3  PO2ART 178.0* 69.1* 62.6*   Liver  Enzymes  Recent Labs Lab 08/04/14 0300  AST 19  ALT 17  ALKPHOS 65  BILITOT 0.3  ALBUMIN 2.1*   Cardiac Enzymes  Recent Labs Lab 08/06/2014 2100 08/04/14 0300 08/05/14 0909  TROPONINI <0.30 <0.30 <0.30   Glucose  Recent Labs Lab 08/08/14 1436 08/08/14 1707 08/08/14 1951 08/08/14 2354 08/09/14 0350 08/09/14 0751  GLUCAP 148* 114* 119* 119* 112* 133*    Imaging Dg Chest Port 1 View  08/08/2014   CLINICAL DATA:  Central catheter placement  EXAM: PORTABLE CHEST - 1 VIEW  COMPARISON:  Study obtained earlier in the day  FINDINGS: Central catheter tip is in the superior vena cava. Endotracheal tube tip is 1.9 cm above the  carina. Nasogastric tube tip and side port are below the diaphragm. No pneumothorax. Areas of edema remain stable. Consolidation in the left upper lobe, left base, and right mid lung regions are stable. No new opacity. No change in cardiac silhouette. No adenopathy.  IMPRESSION: Central catheter tip in superior vena cava. Tube positions as described. No pneumothorax. Areas of airspace consolidation remain as does interstitial edema. There is no change in cardiac silhouette.   Electronically Signed   By: Bretta Bang M.D.   On: 08/08/2014 13:30   Dg Chest Port 1 View  08/08/2014   CLINICAL DATA:  Acute respiratory failure  EXAM: PORTABLE CHEST - 1 VIEW  COMPARISON:  August 07, 2014  FINDINGS: Endotracheal tube tip is 2.8 cm above the carina. Nasogastric tube tip is in the stomach with the side port just below the gastroesophageal junction. No pneumothorax.  Extensive interstitial and patchy alveolar edema remain with perhaps slight partial clearing compared to 1 day prior. There is persistent consolidation in the right mid lung and left upper lobe regions. There is some new patchy infiltrate in the right base laterally. There is also some stable patchy infiltrate in the left base. Heart size is normal. Pulmonary vascular is normal. No adenopathy. There is arthropathy in both shoulders.  IMPRESSION: Tube positions as described without pneumothorax. Marginally less edema overall. Areas of consolidation bilaterally with a new small area of consolidation in the right base laterally.   Electronically Signed   By: Bretta Bang M.D.   On: 08/08/2014 08:24   Dg Abd Portable 1v  08/08/2014   CLINICAL DATA:  Nasogastric tube placement  EXAM: PORTABLE ABDOMEN - 1 VIEW  COMPARISON:  None.  FINDINGS: Nasogastric tube tip and side port are in the proximal stomach. The overall bowel gas pattern is unremarkable. No obstruction or free air is seen on this supine examination. There is interstitial fibrosis in the  lung bases. There is lumbar levoscoliosis.  IMPRESSION: Overall bowel gas pattern unremarkable. Moderate stool throughout colon. Nasogastric tube tip and side port in proximal stomach.   Electronically Signed   By: Bretta Bang M.D.   On: 08/08/2014 13:29     ASSESSMENT / PLAN:  PULMONARY OETT 11/9 >>  A: Acute Hypoxic Respiratory Failure -  ARDS Diffuse Bilateral Airspace Disease - DDx includes aspiration (hiatal hernia + vomiting), viral / bacterial etiology, opportunistic infection / MAI (plaquenil + prednisone 7.5mg ), viral pneumonitis, rheumatoid interstitial disease.   Small Bilateral Pleural Effusions  Vent asynchrony P:   Q3 PRN Albuterol  ARDS protocol -keep Tv at 8cc/kg, titrate PEEP Needs higher sedation for synchrony on ARDS protocol  CARDIOVASCULAR CVL  A: Septic shock  P:  Wean Neo gtt for MAP > 65 NS @ 75  CVP at goal, perfusing  well clinically   RENAL A:   Mild Hyperkalemia  P:   Trend BMP Replace electrolytes as indicated   GASTROINTESTINAL A:   Large Paraesophageal Hernia -unable to place OG/NGT bedside or IR Protein Calorie Malnutrition P:   Pepcid for SUP On protonix + pepcid at home, hold protonix with abx & C-diff risk May need GI to place  HEMATOLOGIC A:   Leukocytosis  Mild Anemia  P:  Trend CBC Heparin for DVT prophylaxis   INFECTIOUS A:   Bilateral Diffuse Infiltrates  P:   BCx2 11/9 >>ng Sputum 11/9 >>  RVP 11/9 >>  PCP 11/9 >>neg  Strep Pneumo 11/9 >> neg Abx: Azithro, start date 11/6 x 1 dose Abx: Rocephin, start date 11/5, x1 dose  Abx: Vanco, start date 11/8 Abx: Cefepime, start date 11/8  Dc vanc since no candida  ENDOCRINE A:   DM II Rheumatoid Arthritis - on plaquenil + prednisone 5 mg at baseline P:   SSI Lantus 20 units Stress dose hydrocort  NEUROLOGIC A:   Fibromyalgia  Pain - ICU discomfort + ? Fibro flare Mechanical Fall P:   RASS goal: 0 Fentanyl PAD protocol  Add versed low dose  gtt    FAMILY  - Updates: husband 11/11  - Inter-disciplinary family meet or Palliative Care meeting due by:   Summary - Unchanged hypoxia, tolerates PS5/5 & needs higher sedation for vent synchrony Feeding an issue since NGT cannot be placed   The patient is critically ill with multiple organ systems failure and requires high complexity decision making for assessment and support, frequent evaluation and titration of therapies, application of advanced monitoring technologies and extensive interpretation of multiple databases. Critical Care Time devoted to patient care services described in this note is 40 minutes.   Cyril Mourning MD. Tonny Bollman. [facility] Pager [phone] If no response call [phone]   08/09/2014, 9:42 AM

## 2014-08-09 NOTE — Progress Notes (Signed)
Consultation  Referring Provider: Critical Care Medicine      Primary Care Physician:  Ezequiel Kayser, MD Primary Gastroenterologist:   Melvia Heaps, MD     Reason for Consultation:  Need for small bowel feeding tube placement             HPI:   KARYSA HEFT is a 80 y.o. female known remotely to Dr. Arlyce Dice. She has a history of diverticulitis and a hiatal hernia.Patient has a history of a paraesophageal hernia seen on CTscan a few weeks prior [facility] ED).  Patient admitted on 11/5 with weakness and cough. CTA of chest, negative for PE but revealed mod bilateral multfocal airspace disease, small bil pleural effusions. Patient required intubation for hypoxia. NGT placement unsuccessful. IR was consulted but hesitant to try because of large hiatal hernia seen on CTscan.  Husband at bedside, son, who is an Training and development officer in [place], [place] is on phone during my visit.   Past Medical History  Diagnosis Date  . Diabetes mellitus     "prednisone induced" per patient  . Osteoporosis   . Rheumatoid arthritis(714.0)   . Fibromyalgia     Past Surgical History  Procedure Laterality Date  . Tonsillectomy    . "womb suspension" and appendectomy      age 89  . Dilation and curettage of uterus    . Cystoscopy      evaluating UTI    Family History  Problem Relation Age of Onset  . Heart disease Mother      History  Substance Use Topics  . Smoking status: Never Smoker   . Smokeless tobacco: Never Used  . Alcohol Use: No    Prior to Admission medications   Medication Sig Start Date End Date Taking? Authorizing Provider  albuterol (PROVENTIL HFA;VENTOLIN HFA) 108 (90 BASE) MCG/ACT inhaler Inhale 1 puff into the lungs every 6 (six) hours as needed for wheezing or shortness of breath.   Yes Historical Provider, MD  calcium-vitamin D (OSCAL WITH D) 500-200 MG-UNIT per tablet Take 1 tablet by mouth daily.     Yes Historical Provider, MD  cholecalciferol (VITAMIN D) 1000 UNITS tablet Take  1,000 Units by mouth daily.   Yes Historical Provider, MD  FLUoxetine (PROZAC) 20 MG capsule Take 20 mg by mouth daily.     Yes Historical Provider, MD  folic acid (FOLVITE) 1 MG tablet Take 1 mg by mouth 2 (two) times daily.     Yes Historical Provider, MD  hydroxychloroquine (PLAQUENIL) 200 MG tablet Take 200 mg by mouth 2 (two) times daily.     Yes Historical Provider, MD  ibandronate (BONIVA) 3 MG/3ML SOLN injection Inject 3 mLs (3 mg total) into the vein once. 03/10/12  Yes Ezequiel Kayser, MD  Insulin Glargine (TOUJEO SOLOSTAR) 300 UNIT/ML SOPN Inject 8 Units into the skin daily. Before breakfast   Yes Historical Provider, MD  LORazepam (ATIVAN) 0.5 MG tablet Take 0.5 mg by mouth Nightly.     Yes Historical Provider, MD  morphine 20 MG/5ML solution Take 5-10 mg by mouth every 2 (two) hours as needed for pain.   Yes Historical Provider, MD  NITROSTAT 0.4 MG SL tablet Place 0.4 mg under the tongue every 5 (five) minutes as needed for chest pain.  10/20/13  Yes Historical Provider, MD  PANTOPRAZOLE SODIUM PO Take 1 tablet by mouth every morning.   Yes Historical Provider, MD  predniSONE (DELTASONE) 1 MG tablet Take 2 mg by mouth daily  with breakfast. Take with prednisone 5 mg   Yes Historical Provider, MD  predniSONE (DELTASONE) 5 MG tablet Take 5 mg by mouth daily. Take with prednisone 2 mg   Yes Historical Provider, MD  Probiotic Product (ALIGN PO) Take 1 capsule by mouth daily.   Yes Historical Provider, MD  promethazine (PHENERGAN) 12.5 MG tablet Take 12.5 mg by mouth every 6 (six) hours as needed for nausea or vomiting.    Yes Historical Provider, MD  ranitidine (ZANTAC) 150 MG capsule Take 150 mg by mouth 2 (two) times daily.     Yes Historical Provider, MD  sitaGLIPtin (JANUVIA) 50 MG tablet Take 100 mg by mouth daily.    Yes Historical Provider, MD  pantoprazole (PROTONIX) 20 MG tablet Take 40 mg by mouth daily.     Historical Provider, MD  sucralfate (CARAFATE) 1 G tablet Take 1 tablet (1 g  total) by mouth 4 (four) times daily. Patient not taking: Reported on 08/24/2014 07/07/14   Olivia Mackie, MD    Current Facility-Administered Medications  Medication Dose Route Frequency Provider Last Rate Last Dose  . 0.9 %  sodium chloride infusion   Intravenous Continuous Zigmund Gottron, MD 75 mL/hr at 08/08/14 2216    . albuterol (PROVENTIL) (2.5 MG/3ML) 0.083% nebulizer solution 2.5 mg  2.5 mg Nebulization Q3H PRN Jeanella Craze, NP      . antiseptic oral rinse (CPC / CETYLPYRIDINIUM CHLORIDE 0.05%) solution 7 mL  7 mL Mouth Rinse QID Cyril Mourning V, MD   7 mL at 08/09/14 0400  . bisacodyl (DULCOLAX) suppository 10 mg  10 mg Rectal Q12H PRN Karie Soda, MD      . calcium-vitamin D (OSCAL WITH D) 500-200 MG-UNIT per tablet 1 tablet  1 tablet Oral Daily Osvaldo Shipper, MD   1 tablet at 08/08/14 1004  . ceFEPIme (MAXIPIME) 1 g in dextrose 5 % 50 mL IVPB  1 g Intravenous 3 times per day Dannielle Huh, RPH   1 g at 08/09/14 9563  . chlorhexidine (PERIDEX) 0.12 % solution 15 mL  15 mL Mouth Rinse BID Cyril Mourning V, MD   15 mL at 08/09/14 0917  . famotidine (PEPCID) IVPB 20 mg  20 mg Intravenous Q12H Coralyn Helling, MD   20 mg at 08/09/14 1110  . feeding supplement (PRO-STAT SUGAR FREE 64) liquid 30 mL  30 mL Per Tube q morning - 10a Sharyne Richters, RD   Stopped at 08/08/14 1100  . fentaNYL (SUBLIMAZE) 2,500 mcg in sodium chloride 0.9 % 250 mL (10 mcg/mL) infusion  0-400 mcg/hr Intravenous Continuous Jeanella Craze, NP 22.5 mL/hr at 08/09/14 0810 225 mcg/hr at 08/09/14 0810  . fentaNYL (SUBLIMAZE) bolus via infusion 25-50 mcg  25-50 mcg Intravenous Q1H PRN Jeanella Craze, NP   100 mcg at 08/09/14 0805  . folic acid (FOLVITE) tablet 1 mg  1 mg Oral BID Osvaldo Shipper, MD   1 mg at 08/08/14 1004  . heparin injection 5,000 Units  5,000 Units Subcutaneous 3 times per day Osvaldo Shipper, MD   5,000 Units at 08/09/14 0625  . hydrocortisone sodium succinate (SOLU-CORTEF) 100 MG injection 50  mg  50 mg Intravenous Q8H Cyril Mourning V, MD   50 mg at 08/09/14 1112  . hydroxychloroquine (PLAQUENIL) tablet 200 mg  200 mg Oral BID Osvaldo Shipper, MD   200 mg at 08/08/14 1004  . insulin aspart (novoLOG) injection 0-20 Units  0-20 Units Subcutaneous 6 times per day  Oretha Milch, MD   3 Units at 08/09/14 [id number]  . insulin glargine (LANTUS) injection 20 Units  20 Units Subcutaneous Daily Oretha Milch, MD   20 Units at 08/09/14 1109  . lip balm (CARMEX) ointment 1 application  1 application Topical BID Karie Soda, MD   1 application at 08/09/14 1000  . magic mouthwash  15 mL Oral TID Edsel Petrin, DO   15 mL at 08/09/14 1105  . midazolam (VERSED) 50 mg in sodium chloride 0.9 % 50 mL (1 mg/mL) infusion  0-5 mg/hr Intravenous Continuous Cyril Mourning V, MD 2 mL/hr at 08/09/14 0908 2 mg/hr at 08/09/14 0908  . midazolam (VERSED) bolus via infusion 1-2 mg  1-2 mg Intravenous Q2H PRN Oretha Milch, MD      . ondansetron (ZOFRAN) tablet 4 mg  4 mg Oral Q6H PRN Osvaldo Shipper, MD       Or  . ondansetron (ZOFRAN) injection 4 mg  4 mg Intravenous Q6H PRN Osvaldo Shipper, MD   4 mg at 08/06/14 0600  . phenylephrine (NEO-SYNEPHRINE) 10 mg in dextrose 5 % 250 mL (0.04 mg/mL) infusion  30-200 mcg/min Intravenous Titrated Jeanella Craze, NP 37.5 mL/hr at 08/09/14 0957 25 mcg/min at 08/09/14 0957  . saccharomyces boulardii (FLORASTOR) capsule 250 mg  250 mg Oral BID Karie Soda, MD   250 mg at 08/08/14 1004  . senna-docusate (Senokot-S) tablet 2 tablet  2 tablet Oral BID Edsel Petrin, DO   2 tablet at 08/08/14 1004  . sodium chloride 0.9 % injection 3 mL  3 mL Intravenous Q12H Osvaldo Shipper, MD   3 mL at 08/09/14 1105  . vancomycin (VANCOCIN) IVPB 1000 mg/200 mL premix  1,000 mg Intravenous Q12H Cyril Mourning V, MD   1,000 mg at 08/09/14 0915    Allergies as of 08/13/2014 - Review Complete 08/18/2014  Allergen Reaction Noted  . Acetaminophen  08/22/2014  . Adalimumab  05/07/2010  . Allegra  [fexofenadine]  08/28/2014  . Allopurinol  08/28/2014  . Boniva [ibandronic acid]  08/02/2014  . Ciprofloxacin  08/27/2014  . Codeine  05/07/2010  . Dextromethorphan Nausea And Vomiting 07/30/2014  . Enbrel [etanercept]  08/02/2014  . Epinephrine  05/07/2010  . Estrogens  08/14/2014  . Forteo [teriparatide (recombinant)]  08/14/2014  . Gabapentin    . Infliximab  05/07/2010  . Levaquin [levofloxacin]  08/16/2014  . Levemir [insulin detemir] Itching 08/17/2014  . Meperidine hcl  05/07/2010  . Methocarbamol  05/07/2010  . Methotrexate  05/07/2010  . Other  08/07/2014  . Penicillins  05/07/2010  . Pentazocine lactate  05/07/2010  . Quinine derivatives Diarrhea and Nausea And Vomiting 08/18/2014  . Qvar [beclomethasone]  08/18/2014  . Risedronate sodium    . Robitussin (alcohol free) [guaifenesin]  08/18/2014  . Sulfonamide derivatives  05/07/2010  . Tetracycline  05/07/2010  . Tramadol Other (See Comments) 07/07/2014  . Doxycycline Diarrhea 05/07/2010  . Metronidazole Rash 05/07/2010  . Vitamin d2 [ergocalciferol] Rash 08/16/2014    Review of Systems:    Unobtainable, patient sedated on ventilator   Physical Exam:  Vital signs in last 24 hours: Temp:  [96.4 F (35.8 C)-98.4 F (36.9 C)] 98 F (36.7 C) (11/11 0800) Pulse Rate:  [57-130] 67 (11/11 1130) Resp:  [9-25] 17 (11/11 1130) BP: (95-116)/(38-58) 102/50 mmHg (11/11 1130) SpO2:  [79 %-99 %] 93 % (11/11 1130) FiO2 (%):  [60 %] 60 % (11/11 1130) Weight:  [154 lb 5.2 oz (70 kg)]  154 lb 5.2 oz (70 kg) (11/11 0447) Last BM Date: 08/05/2014 General:   female in NAD Head:  Normocephalic and atraumatic. Eyes:   No icterus.   Conjunctiva pink. Ears:  Normal auditory acuity. Neck:  Supple; no masses felt Lungs:  Respirations even and unlabored.Sedated on vent.  Heart:  Regular rate and rhythm Abdomen:  Soft, nondistended, nontender. Normal bowel sounds. No appreciable masses or hepatomegaly.  Rectal:  Not performed.    Msk:  Symmetrical without gross deformities.  Extremities:  Without edema. Neurologic:  Alert and  oriented x4;  grossly normal neurologically. Skin:  Intact without significant lesions or rashes. Cervical Nodes:  No significant cervical adenopathy. Psych:  Alert and cooperative. Normal affect.  LAB RESULTS:  Recent Labs  08/07/14 0355 08/08/14 0820 08/09/14 0440  WBC 25.2* 33.5* 31.2*  HGB 11.3* 9.1* 8.9*  HCT 35.0* 28.9* 28.1*  PLT 438* 552* 495*   BMET  Recent Labs  08/07/14 0355 08/08/14 0820 08/09/14 0440  NA 129* 128* 136*  K 5.3 5.0 4.7  CL 90* 94* 103  CO2 GLUCOSE 161* 255* 119*  BUN CREATININE 0.64 0.65 0.61  CALCIUM 9.5 8.6 8.4   STUDIES: Dg Chest Port 1 View  08/09/2014   CLINICAL DATA:  Respiratory failure/hypoxia  EXAM: PORTABLE CHEST - 1 VIEW  COMPARISON:  August 08, 2014  FINDINGS: Endotracheal tube tip is 2.7 cm above the carina. Central catheter tip is in the superior cava. Nasogastric tube is no longer appreciable. No pneumothorax appreciable.  Generalized interstitial edema remains. Patchy consolidation in the left upper lobe remains. There has been partial clearing of patchy infiltrate from the right mid lung. Patchy infiltrate in the left base remains. No new opacity. Heart is upper normal in size with pulmonary vascularity within normal limits. No adenopathy.  IMPRESSION: Tube and catheter positions as described. No apparent pneumothorax. Persistent areas of consolidation in the left upper lobe and left base. Right mid lung consolidation has cleared. Diffuse interstitial edema remains. No change in cardiac silhouette.   Electronically Signed   By: Bretta Bang M.D.   On: 08/09/2014 07:08    Dg Chest Port 1v Same Day  08/09/2014   CLINICAL DATA:  Nasogastric tube placement, recent diagnosis of pneumonia, diabetes mellitus, on ventilator  EXAM: PORTABLE CHEST - 1 VIEW SAME DAY  COMPARISON:  Portable exam 1032 hr compared to  0509 hr ; correlation CT chest 08/06/2014  FINDINGS: Tip of endotracheal tube projects 4.1 cm above carina.  Nasogastric tube terminates LEFT paraspinal at inferior T9 just above a large hiatal hernia.  The course of the nasogastric tube matches the course of the thoracic esophagus on prior CT.  LEFT jugular central venous catheter tip projects over SVC.  Normal heart size and pulmonary vascularity.  Diffuse pulmonary infiltrates and scattered areas of atelectasis in LEFT upper lobe and RIGHT mid lung again noted.  No gross pleural effusion or pneumothorax.  IMPRESSION: Tip of nasogastric tube projects over the thoracic esophagus just proximal to a large hiatal hernia LEFT of midline, following the course of the thoracic esophagus as identified on recent CT chest.  Persistent pulmonary infiltrates.   Electronically Signed   By: Ulyses Southward M.D.   On: 08/09/2014 10:50    PREVIOUS ENDOSCOPIES:   Colonoscopy 2011- Dr. Arlyce Dice         ENDOSCOPIC IMPRESSION:  1) 5 mm sessile polyp in the cecum  2) Severe diverticulosis in the  sigmoid colon  3) Internal hemorrhoids  4) Otherwise normal examination  RECOMMENDATIONS:  1) If the polyp(s) removed today are proven to be adenomatous  (pre-cancerous) polyps, you will need a repeat colonoscopy in 5  years. Otherwise you should continue to follow colorectal cancer  screening guidelines for "routine risk" patients with colonoscopy  in 10 years.   EGD 2012 for chest pain - Dr. Arlyce Dice Findings: Hiatal hernia, o/w normal.    Impression / Plan:    80 year old female admitted with ARDS, intubated on vent for hypoxia. Patient needs enteral feeding tube placement nutrition. NGT placement was unsuccessful. IR not comfortable placing tube given paraesophageal hernia. We will attempt to place endoscopically in am. The benefits, risks, and potential complications of EGD with were discussed with the patient's husband (POA) and her son and  they agree to proceed.   Thanks   LOS: 6 days   Willette Cluster  08/09/2014, 11:50 AM    ________________________________________________________________________  [facility] MD note: I personally examined the patient, reviewed the data and agree with the assessment and plan described above.  She has a large paraesophageal hernia that will likely complicate placement of feeding tube but I still think there is a very good chance I can place a feeding tube with tip in either distal stomach or post pyloric. Planning for this tomorrow.   Rob Bunting, MD [facility] Pager [phone]

## 2014-08-09 NOTE — Progress Notes (Signed)
Placed NGT - appears to be above diaphragm ( gastric shadow -large hiatal hernia) Pushed in to but appears to coil in neck without advancing distally. Will ask GI to place small bore post pyloric tube endoscopically - will need swallow eval once extubated  Oretha Milch MD

## 2014-08-09 NOTE — Progress Notes (Signed)
Saw Blanch Media today. She remains intubated and sedated on vent (fentanyl, versed).  PEEP 10, FiO2 60%.  Discussed case with Dr Hilma Favors. Also met briefly with her husband Mr Scarola today. He did not wish to talk with me much today. As noted by Dr Hilma Favors, I think he remains suspicious of Palliative Care involvement.  I will continue to check in with him to see if we can be of support.  He may not wish to engage with [place] at all and we would certainly respect that.   Doran Clay D.O. Palliative Medicine Team at [facility]  Pager: [phone] Team Phone: [phone]

## 2014-08-10 ENCOUNTER — Encounter (HOSPITAL_COMMUNITY): Admission: EM | Disposition: E | Payer: Self-pay | Source: Home / Self Care | Attending: Pulmonary Disease

## 2014-08-10 ENCOUNTER — Inpatient Hospital Stay (HOSPITAL_COMMUNITY): Payer: Medicare Other

## 2014-08-10 HISTORY — PX: ESOPHAGOGASTRODUODENOSCOPY: SHX5428

## 2014-08-10 LAB — BASIC METABOLIC PANEL
Anion gap: 10 (ref 5–15)
BUN: 17 mg/dL (ref 6–23)
CALCIUM: 8.2 mg/dL — AB (ref 8.4–10.5)
CO2: 21 mEq/L (ref 19–32)
Chloride: 100 mEq/L (ref 96–112)
Creatinine, Ser: 0.62 mg/dL (ref 0.50–1.10)
GFR calc non Af Amer: 84 mL/min — ABNORMAL LOW (ref >90)
GLUCOSE: 133 mg/dL — AB (ref 70–99)
POTASSIUM: 4.7 meq/L (ref 3.7–5.3)
Sodium: 131 mEq/L — ABNORMAL LOW (ref 137–147)

## 2014-08-10 LAB — BLOOD GAS, ARTERIAL
ACID-BASE DEFICIT: 5.8 mmol/L — AB (ref 0.0–2.0)
ACID-BASE DEFICIT: 5.6 mmol/L — AB (ref 0.0–2.0)
Acid-base deficit: 8.8 mmol/L — ABNORMAL HIGH (ref 0.0–2.0)
Acid-base deficit: 4.5 mmol/L — ABNORMAL HIGH (ref 0.0–2.0)
Bicarbonate: 20.4 mEq/L (ref 20.0–24.0)
Bicarbonate: 20.5 mEq/L (ref 20.0–24.0)
Bicarbonate: 19.3 mEq/L — ABNORMAL LOW (ref 20.0–24.0)
Bicarbonate: 21.5 mEq/L (ref 20.0–24.0)
Drawn by: 232811
Drawn by: 232811
Drawn by: 42624
Drawn by: 426241
FIO2: 0.8 %
FIO2: 0.9 %
FIO2: 1 %
FIO2: 1 %
LHR: 24 {breaths}/min
MECHVT: 450 mL
O2 Saturation: 85.1 %
O2 Saturation: 80.9 %
O2 Saturation: 83.2 %
O2 Saturation: 98.8 %
PATIENT TEMPERATURE: 98.9
PATIENT TEMPERATURE: 98.6
PCO2 ART: 46.7 mmHg — AB (ref 35.0–45.0)
PCO2 ART: 47.9 mmHg — AB (ref 35.0–45.0)
PEEP/CPAP: 16 cmH2O
PEEP: 14 cmH2O
PEEP: 14 cmH2O
PEEP: 16 cmH2O
PH ART: 7.267 — AB (ref 7.350–7.450)
PH ART: 7.17 — AB (ref 7.350–7.450)
PH ART: 7.275 — AB (ref 7.350–7.450)
Patient temperature: 98.7
Patient temperature: 98.6
RATE: 20 resp/min
RATE: 24 resp/min
RATE: 35 resp/min
TCO2: 19.7 mmol/L (ref 0–100)
TCO2: 19.8 mmol/L (ref 0–100)
TCO2: 19 mmol/L (ref 0–100)
TCO2: 21.1 mmol/L (ref 0–100)
VT: 460 mL
VT: 460 mL
VT: 460 mL
pCO2 arterial: 46.7 mmHg — ABNORMAL HIGH (ref 35.0–45.0)
pCO2 arterial: 55 mmHg — ABNORMAL HIGH (ref 35.0–45.0)
pH, Arterial: 7.264 — ABNORMAL LOW (ref 7.350–7.450)
pO2, Arterial: 55.3 mmHg — ABNORMAL LOW (ref 80.0–100.0)
pO2, Arterial: 49.1 mmHg — ABNORMAL LOW (ref 80.0–100.0)
pO2, Arterial: 55.5 mmHg — ABNORMAL LOW (ref 80.0–100.0)
pO2, Arterial: 153 mmHg — ABNORMAL HIGH (ref 80.0–100.0)

## 2014-08-10 LAB — GLUCOSE, CAPILLARY
GLUCOSE-CAPILLARY: 129 mg/dL — AB (ref 70–99)
GLUCOSE-CAPILLARY: 132 mg/dL — AB (ref 70–99)
GLUCOSE-CAPILLARY: 190 mg/dL — AB (ref 70–99)
GLUCOSE-CAPILLARY: 161 mg/dL — AB (ref 70–99)
Glucose-Capillary: 156 mg/dL — ABNORMAL HIGH (ref 70–99)
Glucose-Capillary: 273 mg/dL — ABNORMAL HIGH (ref 70–99)

## 2014-08-10 LAB — CULTURE, RESPIRATORY W GRAM STAIN

## 2014-08-10 LAB — CBC
HCT: 29.4 % — ABNORMAL LOW (ref 36.0–46.0)
Hemoglobin: 9 g/dL — ABNORMAL LOW (ref 12.0–15.0)
MCH: 24.8 pg — ABNORMAL LOW (ref 26.0–34.0)
MCHC: 30.6 g/dL (ref 30.0–36.0)
MCV: 81 fL (ref 78.0–100.0)
PLATELETS: 246 10*3/uL (ref 150–400)
RBC: 3.63 MIL/uL — ABNORMAL LOW (ref 3.87–5.11)
RDW: 14.9 % (ref 11.5–15.5)
WBC: 25.6 10*3/uL — AB (ref 4.0–10.5)

## 2014-08-10 LAB — CULTURE, RESPIRATORY

## 2014-08-10 LAB — VANCOMYCIN, TROUGH: VANCOMYCIN TR: 20.6 ug/mL — AB (ref 10.0–20.0)

## 2014-08-10 SURGERY — EGD (ESOPHAGOGASTRODUODENOSCOPY)
Anesthesia: Moderate Sedation

## 2014-08-10 MED ORDER — FAT EMULSION 20 % IV EMUL
250.0000 mL | INTRAVENOUS | Status: AC
  Administered 2014-08-10: 250 mL via INTRAVENOUS
  Filled 2014-08-10: qty 250

## 2014-08-10 MED ORDER — TRACE MINERALS CR-CU-F-FE-I-MN-MO-SE-ZN IV SOLN
INTRAVENOUS | Status: AC
  Administered 2014-08-10: 18:00:00 via INTRAVENOUS
  Filled 2014-08-10: qty 1000

## 2014-08-10 MED ORDER — HEPARIN SODIUM (PORCINE) 5000 UNIT/ML IJ SOLN
5000.0000 [IU] | Freq: Three times a day (TID) | INTRAMUSCULAR | Status: DC
  Administered 2014-08-10 – 2014-08-11 (×6): 5000 [IU] via SUBCUTANEOUS
  Filled 2014-08-10 (×6): qty 1

## 2014-08-10 MED ORDER — SODIUM BICARBONATE 8.4 % IV SOLN
INTRAVENOUS | Status: DC
  Administered 2014-08-10 (×2): via INTRAVENOUS
  Filled 2014-08-10 (×4): qty 150

## 2014-08-10 MED ORDER — VANCOMYCIN HCL IN DEXTROSE 750-5 MG/150ML-% IV SOLN
750.0000 mg | Freq: Two times a day (BID) | INTRAVENOUS | Status: DC
  Administered 2014-08-11 (×2): 750 mg via INTRAVENOUS
  Filled 2014-08-10 (×3): qty 150

## 2014-08-10 MED ORDER — SODIUM CHLORIDE 0.9 % IV SOLN: INTRAVENOUS | Status: DC

## 2014-08-10 MED ORDER — SODIUM BICARBONATE 8.4 % IV SOLN
100.0000 meq | Freq: Once | INTRAVENOUS | Status: AC
  Administered 2014-08-10: 100 meq via INTRAVENOUS

## 2014-08-10 MED ORDER — SODIUM CHLORIDE 0.9 % IV SOLN
INTRAVENOUS | Status: DC
  Administered 2014-08-10: 19:00:00 via INTRAVENOUS

## 2014-08-10 MED ORDER — VECURONIUM BROMIDE 10 MG IV SOLR
5.0000 mg | Freq: Once | INTRAVENOUS | Status: AC
Start: 2014-08-10 — End: 2014-08-10
  Administered 2014-08-10: 5 mg via INTRAVENOUS
  Filled 2014-08-10: qty 10

## 2014-08-10 MED ORDER — DEXTROSE 5 % IV SOLN
30.0000 ug/min | INTRAVENOUS | Status: DC
  Administered 2014-08-10: 200 ug/min via INTRAVENOUS
  Administered 2014-08-10: 180 ug/min via INTRAVENOUS
  Administered 2014-08-11: 190 ug/min via INTRAVENOUS
  Administered 2014-08-11: 150 ug/min via INTRAVENOUS
  Administered 2014-08-11: 200 ug/min via INTRAVENOUS
  Administered 2014-08-11: 195 ug/min via INTRAVENOUS
  Filled 2014-08-10 (×7): qty 4

## 2014-08-10 NOTE — Progress Notes (Signed)
PT Cancellation/ Sign off Note  Patient Details Name: Patricia Mosley MRN: [id number] DOB: 20-Oct-1933   Cancelled Treatment:    Reason Eval/Treat Not Completed: Medical issues which prohibited therapy. Chart reviewed, pt still have medical issues and unable to participate in PT. Will sign off at this time.... However, please re-order when pt able to benefit and participate from therapy.   Thanks   Marella Bile 08/07/2014, 9:11 AM  Marella Bile, PT Pager: [phone] 07/31/2014

## 2014-08-10 NOTE — Progress Notes (Signed)
Chaplain called on a code by Licensed conveyancer per nurse.  When Chaplain arrived, patient had been revived and nurse was focused on bring blood pressure up.  The husband of the patient (for 60 years) was sitting at the foot of the bed, about 3' back.  Sat down wth him and let him talk.     He spoke of their marriage right after HS, their two boys, some "adventures" together over the years and various anecdotes.  Chaplain just let him talk of what was important to him.  Dr. Vassie Loll arrived.  In speaking to the husband, he mentioned that the event may very well happen again.  Dr. also mentioned that he was glad the husband was not there for the resuscitation.  After some conversation with Dr. and husband, Chaplain inquired as to whether the husband would want his wife to go through chest compressions and the ordeal again. The husband received the inquiry well and declined.  The doctor acknowledged my inquiry and heard the husband's response.    Later, and after the doctor left, the husband shared his experience with a palliative care meeting earlier in the week.  He indicated he felt pressured to go to comfort care.  Chaplain encouraged him to do what he felt best for his wife, acknowledging their years together and how close they have been for so long.    Bottom line, at this juncture, end-of-life care is not going to be a topic of discussion.  However, that likely means that the husband is totally unprepared for the eventuality of death, though he was willing to discuss that event to some degree.  His level of apprehension and emotional unpreparedness needs to be of concern in future Chaplain sessions.  They have two sons, one in [place] with a wife whose issues keep that son close to home; the other in [place], where he has a Administrator, arts.  The latter was on his way to the hospital.   Rema Jasmine, Chaplain Pager: [phone]

## 2014-08-10 NOTE — Progress Notes (Signed)
PULMONARY / CRITICAL CARE MEDICINE   Name: Patricia Mosley MRN: [id number] DOB: 12-15-1933    ADMISSION DATE:  08/15/2014 CONSULTATION DATE:  08/07/14  REFERRING MD :  Dr. Gonzella Lex   CHIEF COMPLAINT:  Hypoxic Respiratory Failure   INITIAL PRESENTATION: 80 y/o F admitted 11/5 with cough & generalized weakness.  Work up concerning for R CAP and patient admitted for evaluation.  Patient had progressive hypoxemia and PCCM consulted , required mech ventilation    STUDIES:  11/07  CT Head >> no acute abnormality, chronic microvascular ischemic white matter disease  11/08  CTA Chest >> no PE, mod bilateral multifocal airspace disease, small B pleural effusions, mod-lg hiatal hernia  SIGNIFICANT EVENTS: 11/05  Admit per [facility] with CAP, Rx'd as outpatient with azithro & increase in baseline prednisone  11/07  N/V 11/08  Development of worsening R sided airspace disease, CTA chest neg for Pe 11/09  Decompensated, hypoxic requiring intubation 11/10  Improved oxygenation on ABG, CXR with improved airspace disease  11/11 Severe agitation on WUA- causing vent asynchrony & desaturation, unable to place NGT   SUBJECTIVE: Required higher PEEP/FIO2 overnight Afebrile UO ok Sedated RASS-2  VITAL SIGNS: Temp:  [96.1 F (35.6 C)-98.9 F (37.2 C)] 98.9 F (37.2 C) (11/12 0315) Pulse Rate:  [59-81] 71 (11/12 0731) Resp:  [10-29] 22 (11/12 0731) BP: (98-126)/(45-69) 109/54 mmHg (11/12 0731) SpO2:  [79 %-97 %] 89 % (11/12 0731) FiO2 (%):  [60 %-100 %] 100 % (11/12 0731) Weight:  [72.9 kg (160 lb 11.5 oz)] 72.9 kg (160 lb 11.5 oz) (11/12 0626)   HEMODYNAMICS: CVP:  [10 mmHg-60 mmHg] 17 mmHg   VENTILATOR SETTINGS: Vent Mode:  [-] PRVC FiO2 (%):  [60 %-100 %] 100 % Set Rate:  [18 bmp-24 bmp] 24 bmp Vt Set:  [460 mL] 460 mL PEEP:  [10 cmH20-16 cmH20] 16 cmH20 Plateau Pressure:  [19 cmH20-29 cmH20] 25 cmH20   INTAKE / OUTPUT:  Intake/Output Summary (Last 24 hours) at 08/13/2014 8299 Last data  filed at 08/06/2014 0600  Gross per 24 hour  Intake 3618.48 ml  Output   1105 ml  Net 2513.48 ml    PHYSICAL EXAMINATION: General:  Frail elderly female intubated,sedated Neuro:  Intermittent agitation on WUA, RASS -2, HEENT:  OETT, Edentulous, tongue coated w/ white/brown plaque,  sub cutaneous crepitus over neck Cardiovascular:  s1s2 rrr, no m/r/g  Lungs:  resp's even/non-labored, lungs bilaterally coarse, asynchrony+ Abdomen:  Round/soft, bsx4 active  Musculoskeletal:  Finger / toe deformities consistent with RA Skin:  Warm/dry, R eye facial laceration with steri-strip's c/d/i  LABS:  CBC  Recent Labs Lab 08/08/14 0820 08/09/14 0440 08/13/2014 0430  WBC 33.5* 31.2* 25.6*  HGB 9.1* 8.9* 9.0*  HCT 28.9* 28.1* 29.4*  PLT 552* 495* 246   Coag's No results for input(s): APTT, INR in the last 168 hours.   BMET  Recent Labs Lab 08/08/14 0820 08/09/14 0440 08/13/2014 0430  NA 128* 136* 131*  K 5.0 4.7 4.7  CL 94* 103 100  CO2 21 23 21   BUN 23 16 17   CREATININE 0.65 0.61 0.62  GLUCOSE 255* 119* 133*   Electrolytes  Recent Labs Lab 08/07/14 0355 08/08/14 0820 08/09/14 0440 08/21/2014 0430  CALCIUM 9.5 8.6 8.4 8.2*  MG 2.0  --   --   --    Sepsis Markers  Recent Labs Lab 08/22/2014 1339  LATICACIDVEN 1.31   ABG  Recent Labs Lab 08/09/14 2315 08/20/2014 0315 07/31/2014 0415  PHART  7.287* 7.264* 7.267*  PCO2ART 44.9 46.7* 46.7*  PO2ART 44.7* 55.3* 49.1*   Liver Enzymes  Recent Labs Lab 08/04/14 0300  AST 19  ALT 17  ALKPHOS 65  BILITOT 0.3  ALBUMIN 2.1*   Cardiac Enzymes  Recent Labs Lab 08/02/2014 2100 08/04/14 0300 08/05/14 0909  TROPONINI <0.30 <0.30 <0.30   Glucose  Recent Labs Lab 08/09/14 0751 08/09/14 1218 08/09/14 1549 08/09/14 1948 08/09/14 2343 2014-08-25 0402  GLUCAP 133* 155* 112* 120* 176* 129*    Imaging Dg Chest Port 1 View  08/09/2014   CLINICAL DATA:  Respiratory failure/hypoxia  EXAM: PORTABLE CHEST - 1 VIEW   COMPARISON:  August 08, 2014  FINDINGS: Endotracheal tube tip is 2.7 cm above the carina. Central catheter tip is in the superior cava. Nasogastric tube is no longer appreciable. No pneumothorax appreciable.  Generalized interstitial edema remains. Patchy consolidation in the left upper lobe remains. There has been partial clearing of patchy infiltrate from the right mid lung. Patchy infiltrate in the left base remains. No new opacity. Heart is upper normal in size with pulmonary vascularity within normal limits. No adenopathy.  IMPRESSION: Tube and catheter positions as described. No apparent pneumothorax. Persistent areas of consolidation in the left upper lobe and left base. Right mid lung consolidation has cleared. Diffuse interstitial edema remains. No change in cardiac silhouette.   Electronically Signed   By: Bretta Bang M.D.   On: 08/09/2014 07:08   Dg Chest Port 1v Same Day  08/09/2014   CLINICAL DATA:  Nasogastric tube placement, recent diagnosis of pneumonia, diabetes mellitus, on ventilator  EXAM: PORTABLE CHEST - 1 VIEW SAME DAY  COMPARISON:  Portable exam 1032 hr compared to 0509 hr ; correlation CT chest 08/06/2014  FINDINGS: Tip of endotracheal tube projects 4.1 cm above carina.  Nasogastric tube terminates LEFT paraspinal at inferior T9 just above a large hiatal hernia.  The course of the nasogastric tube matches the course of the thoracic esophagus on prior CT.  LEFT jugular central venous catheter tip projects over SVC.  Normal heart size and pulmonary vascularity.  Diffuse pulmonary infiltrates and scattered areas of atelectasis in LEFT upper lobe and RIGHT mid lung again noted.  No gross pleural effusion or pneumothorax.  IMPRESSION: Tip of nasogastric tube projects over the thoracic esophagus just proximal to a large hiatal hernia LEFT of midline, following the course of the thoracic esophagus as identified on recent CT chest.  Persistent pulmonary infiltrates.   Electronically  Signed   By: Ulyses Southward M.D.   On: 08/09/2014 10:50   Dg Abd Portable 1v  08/09/2014   CLINICAL DATA:  Nasogastric tube placement  EXAM: PORTABLE ABDOMEN - 1 VIEW  COMPARISON:  August 08, 2014  FINDINGS: This study was obtained to focus of the lower chest and upper abdomen regions. Nasogastric tube tip is in the distal esophagus with the side port in the midesophagus. Visualized bowel gas pattern is normal. Edema and patchy consolidation remain in the lungs.  IMPRESSION: Nasogastric tube tip and side port in stomach. Advise advancing nasogastric tube at least 14 cm. Visualized bowel gas pattern unremarkable.  Critical Value/emergent results were called by telephone at the time of interpretation on 08/09/2014 at 10:46 am to Effie Berkshire, RN, who verbally acknowledged these results.   Electronically Signed   By: Bretta Bang M.D.   On: 08/09/2014 10:47     ASSESSMENT / PLAN:  PULMONARY OETT 11/9 >>  A: Acute Hypoxic Respiratory Failure -  ARDS Diffuse Bilateral Airspace Disease - DDx includes aspiration (hiatal hernia + vomiting), viral / bacterial etiology, opportunistic infection / MAI (plaquenil + prednisone 7.5mg ), viral pneumonitis, rheumatoid interstitial disease.   Small Bilateral Pleural Effusions  Vent asynchrony Sub cutaneous air - concern for pharyngeal trauma, occult pneumothorax P:   Q3 PRN Albuterol  ARDS protocol -keep Tv at 8cc/kg, titrate PEEP lower as FIO2 improves Keep RASS-2 for synchrony on ARDS protocol RPt CXR this am Would prefer CT neck + chest , but difficult transport  CARDIOVASCULAR CVL  A: Septic shock  P:  Wean Neo gtt for MAP > 65 NS @ 75  CVP at goal   RENAL A:   Mild Hyperkalemia  P:   Trend BMP Replace electrolytes as indicated   GASTROINTESTINAL A:   Large Paraesophageal Hernia -unable to place OG/NGT bedside or IR Protein Calorie Malnutrition P:   Pepcid for SUP On protonix + pepcid at home, hold protonix with abx & C-diff  risk Will discuss benefit vs risk of EGD with GI in this setting to place NGT  HEMATOLOGIC A:   Leukocytosis  Mild Anemia  P:  Trend CBC Heparin for DVT prophylaxis   INFECTIOUS A:   Bilateral Diffuse Infiltrates  P:   BCx2 11/9 >>ng Sputum 11/9 >>  RVP 11/9 >>  PCP 11/9 >>neg  Strep Pneumo 11/9 >> neg Abx: Azithro, start date 11/6 x 1 dose Abx: Rocephin, start date 11/5, x1 dose  Abx: Vanco, start date 11/8 Abx: Cefepime, start date 11/8  restart vanc   ENDOCRINE A:   DM II Rheumatoid Arthritis - on plaquenil + prednisone 5 mg at baseline P:   SSI Lantus 20 units Stress dose hydrocort  NEUROLOGIC A:   Fibromyalgia  Pain - ICU discomfort + ? Fibro flare Mechanical Fall P:   RASS goal: 0 Fentanyl PAD protocol  Add versed low dose gtt   FAMILY  - Updates: husband & son (ophthalmologist)11/12  - Inter-disciplinary family meet or Palliative Care meeting -done  Summary - Worsening ARDS, sub cutaneous air raises concern for occult pneumothorax vs pharyngeal/esophageal leak - discussed with GI & TCTS - high risk for endoscopy, would perform CT neck/chest if able to transport   The patient is critically ill with multiple organ systems failure and requires high complexity decision making for assessment and support, frequent evaluation and titration of therapies, application of advanced monitoring technologies and extensive interpretation of multiple databases. Critical Care Time devoted to patient care services described in this note is 50 minutes.   Cyril Mourning MD. Tonny Bollman. [facility] Pager [phone] If no response call [phone]   08/11/2014, 8:33 AM

## 2014-08-10 NOTE — Progress Notes (Signed)
[facility] Progress Note  Subjective:  Patient's O2 sats dropped to 70 this AM.  CXR showing pneumomediastinum.  Dr. Christella Hartigan spoke with Dr. Vassie Loll and will proceed with EGD at bedside in ICU.  Objective:  Vital signs in last 24 hours: Temp:  [96.1 F (35.6 C)-98.9 F (37.2 C)] 97.9 F (36.6 C) (11/12 0731) Pulse Rate:  [59-81] 71 (11/12 0731) Resp:  [10-29] 22 (11/12 0731) BP: (98-126)/(45-69) 109/54 mmHg (11/12 0731) SpO2:  [79 %-97 %] 89 % (11/12 0731) FiO2 (%):  [60 %-100 %] 100 % (11/12 0731) Weight:  [160 lb 11.5 oz (72.9 kg)] 160 lb 11.5 oz (72.9 kg) (11/12 0626) Last BM Date: Aug 21, 2014 General:  Intubated and sedated., crepitance in neck  Lab Results:  Recent Labs  08/08/14 0820 08/09/14 0440 08/02/2014 0430  WBC 33.5* 31.2* 25.6*  HGB 9.1* 8.9* 9.0*  HCT 28.9* 28.1* 29.4*  PLT 552* 495* 246   BMET  Recent Labs  08/08/14 0820 08/09/14 0440 08/01/2014 0430  NA 128* 136* 131*  K 5.0 4.7 4.7  CL 94* 103 100  CO2 21 23 21   GLUCOSE 255* 119* 133*  BUN 23 16 17   CREATININE 0.65 0.61 0.62  CALCIUM 8.6 8.4 8.2*   Dg Chest Port 1 View  08/08/2014   CLINICAL DATA:  Respiratory failure  EXAM: PORTABLE CHEST - 1 VIEW  COMPARISON:  August 09, 2014  FINDINGS: There is been interval removal of nasogastric tube. Endotracheal tube tip is 2.0 cm above the carina. Central catheter tip is in the superior vena cava. No pneumothorax is apparent. However, there is now pneumomediastinum as well as extensive subcutaneous air in the neck regions bilaterally.  There is generalized interstitial edema with patchy areas of consolidation in the left upper lobe and right upper lobe and mid lung regions. There is no new opacity. The heart size is normal. There is a hiatal hernia. Pulmonary vascularity is normal. No adenopathy.  IMPRESSION: Tube and catheter positions as described without pneumothorax. There is no convincing pneumothorax. However, there is extensive  pneumomediastinum as well as extensive subcutaneous air throughout the neck and supraclavicular regions.  There remains interstitial edema with patchy alveolar consolidation, stable. No new opacity. No change in cardiac silhouette.   Electronically Signed   By: 13/08/2014 M.D.   On: 08/04/2014 07:21   Dg Chest Port 1 View  08/09/2014   CLINICAL DATA:  Respiratory failure/hypoxia  EXAM: PORTABLE CHEST - 1 VIEW  COMPARISON:  August 08, 2014  FINDINGS: Endotracheal tube tip is 2.7 cm above the carina. Central catheter tip is in the superior cava. Nasogastric tube is no longer appreciable. No pneumothorax appreciable.  Generalized interstitial edema remains. Patchy consolidation in the left upper lobe remains. There has been partial clearing of patchy infiltrate from the right mid lung. Patchy infiltrate in the left base remains. No new opacity. Heart is upper normal in size with pulmonary vascularity within normal limits. No adenopathy.  IMPRESSION: Tube and catheter positions as described. No apparent pneumothorax. Persistent areas of consolidation in the left upper lobe and left base. Right mid lung consolidation has cleared. Diffuse interstitial edema remains. No change in cardiac silhouette.   Electronically Signed   By: 13/07/2014 M.D.   On: 08/09/2014 07:08   Dg Chest Port 1 View  08/08/2014   CLINICAL DATA:  Central catheter placement  EXAM: PORTABLE CHEST - 1 VIEW  COMPARISON:  Study obtained earlier in the day  FINDINGS: Central  catheter tip is in the superior vena cava. Endotracheal tube tip is 1.9 cm above the carina. Nasogastric tube tip and side port are below the diaphragm. No pneumothorax. Areas of edema remain stable. Consolidation in the left upper lobe, left base, and right mid lung regions are stable. No new opacity. No change in cardiac silhouette. No adenopathy.  IMPRESSION: Central catheter tip in superior vena cava. Tube positions as described. No pneumothorax. Areas of  airspace consolidation remain as does interstitial edema. There is no change in cardiac silhouette.   Electronically Signed   By: Bretta Bang M.D.   On: 08/08/2014 13:30   Dg Chest Port 1v Same Day  08/09/2014   CLINICAL DATA:  Nasogastric tube placement, recent diagnosis of pneumonia, diabetes mellitus, on ventilator  EXAM: PORTABLE CHEST - 1 VIEW SAME DAY  COMPARISON:  Portable exam 1032 hr compared to 0509 hr ; correlation CT chest 08/06/2014  FINDINGS: Tip of endotracheal tube projects 4.1 cm above carina.  Nasogastric tube terminates LEFT paraspinal at inferior T9 just above a large hiatal hernia.  The course of the nasogastric tube matches the course of the thoracic esophagus on prior CT.  LEFT jugular central venous catheter tip projects over SVC.  Normal heart size and pulmonary vascularity.  Diffuse pulmonary infiltrates and scattered areas of atelectasis in LEFT upper lobe and RIGHT mid lung again noted.  No gross pleural effusion or pneumothorax.  IMPRESSION: Tip of nasogastric tube projects over the thoracic esophagus just proximal to a large hiatal hernia LEFT of midline, following the course of the thoracic esophagus as identified on recent CT chest.  Persistent pulmonary infiltrates.   Electronically Signed   By: Ulyses Southward M.D.   On: 08/09/2014 10:50   Dg Abd Portable 1v  08/09/2014   CLINICAL DATA:  Nasogastric tube placement  EXAM: PORTABLE ABDOMEN - 1 VIEW  COMPARISON:  August 08, 2014  FINDINGS: This study was obtained to focus of the lower chest and upper abdomen regions. Nasogastric tube tip is in the distal esophagus with the side port in the midesophagus. Visualized bowel gas pattern is normal. Edema and patchy consolidation remain in the lungs.  IMPRESSION: Nasogastric tube tip and side port in stomach. Advise advancing nasogastric tube at least 14 cm. Visualized bowel gas pattern unremarkable.  Critical Value/emergent results were called by telephone at the time of  interpretation on 08/09/2014 at 10:46 am to Effie Berkshire, RN, who verbally acknowledged these results.   Electronically Signed   By: Bretta Bang M.D.   On: 08/09/2014 10:47   Dg Abd Portable 1v  08/08/2014   CLINICAL DATA:  Nasogastric tube placement  EXAM: PORTABLE ABDOMEN - 1 VIEW  COMPARISON:  None.  FINDINGS: Nasogastric tube tip and side port are in the proximal stomach. The overall bowel gas pattern is unremarkable. No obstruction or free air is seen on this supine examination. There is interstitial fibrosis in the lung bases. There is lumbar levoscoliosis.  IMPRESSION: Overall bowel gas pattern unremarkable. Moderate stool throughout colon. Nasogastric tube tip and side port in proximal stomach.   Electronically Signed   By: Bretta Bang M.D.   On: 08/08/2014 13:29    Assessment / Plan: -80 year old female admitted with ARDS, intubated on vent for hypoxia. Patient needs enteral feeding tube placement nutrition. NGT placement was unsuccessful. IR not comfortable placing tube given paraesophageal hernia.  Plan was to attempt to place endoscopically this AM, however, patient's sats dropped and she has pneumomediastinum  on chest x-ray.  Dr. Christella Hartigan spoke with Dr. Vassie Loll this AM and they decided to proceed to evaluate for perforation, etc, and placement of NGT if possible.     LOS: 7 days   ZEHR, JESSICA D.  08/07/2014, 8:52 AM  Pager number [phone]  ________________________________________________________________________  [facility] MD note:  I personally examined the patient, reviewed the data and agree with the assessment and plan described above.  Clinical change with pneumomediastinum, perhaps iatrogenic injury related to attempted placement of NG tube.  Discussed with Dr. Vassie Loll, still planning to proceed with EGD (evaluated for site of injury, place NG tube with tip in distal stomach for suction, feeding as needed).   Rob Bunting, MD [facility] Pager  [phone]

## 2014-08-10 NOTE — Progress Notes (Signed)
PARENTERAL NUTRITION CONSULT NOTE - INITIAL  Pharmacy Consult for TPN Indication: unable to use enteral nutrition d/t paraesophageal hernia  Allergies  Allergen Reactions  . Acetaminophen     Ineffective.   . Adalimumab     "Sinking spells."  . Allegra [Fexofenadine]     "Serum sickness."   . Allopurinol     "Serum sickness."   . Boniva [Ibandronic Acid]     Oral tablet causes chest pain. IV Boniva OK.    . Ciprofloxacin     "Pains in legs that may have been some mild tendonitis. She could maybe take it again."   . Codeine     Auditory and visual hallucinations.   . Dextromethorphan Nausea And Vomiting  . Enbrel [Etanercept]     "Sinking spells."  . Epinephrine     Unknown.    . Estrogens     "Felt sick."   . Forteo [Teriparatide (Recombinant)]     "Felt weird."   . Gabapentin     Unknown.    . Infliximab     Unknown.    Barbera Setters [Levofloxacin]     "Bilateral inflamed achilles tendons."   . Levemir [Insulin Detemir] Itching  . Meperidine Hcl     Unknown.   . Methocarbamol     Unknown.   . Methotrexate     Unknown.    . Other     "Eye dilating medicine." "Makes her 'goofy.'"   . Penicillins     Unknown.   Marland Kitchen Pentazocine Lactate     Unknown.    . Quinine Derivatives Diarrhea and Nausea And Vomiting  . Qvar [Beclomethasone]     "Made throat hurt worse and maybe made her GERD worse so stopped after 4 days."  . Risedronate Sodium     Tolerates IV boniva  . Robitussin (Alcohol Free) [Guaifenesin]     "Made sick."   . Sulfonamide Derivatives     Unknown.   . Tetracycline     Unknown.    . Tramadol Other (See Comments)    Hallucinations   . Doxycycline Diarrhea  . Metronidazole Rash  . Vitamin D2 [Ergocalciferol] Rash    GI upset.     Patient Measurements: Height: 5\' 5"  (165.1 cm) Weight: 160 lb 11.5 oz (72.9 kg) IBW/kg (Calculated) : 57   Vital Signs: Temp: 97.9 F (36.6 C) (11/12 0731) Temp Source: Axillary (11/12 0731) BP: 109/54 mmHg (11/12  0731) Pulse Rate: 71 (11/12 0731) Intake/Output from previous day: 11/11 0701 - 11/12 0700 In: 3754.3 [I.V.:3114.3; IV Piggyback:600] Out: 1205 [Urine:1205] Intake/Output from this shift:    Labs:  Recent Labs  08/08/14 0820 08/09/14 0440 08/24/2014 0430  WBC 33.5* 31.2* 25.6*  HGB 9.1* 8.9* 9.0*  HCT 28.9* 28.1* 29.4*  PLT 552* 495* 246     Recent Labs  08/08/14 0820 08/09/14 0440 08/16/2014 0430  NA 128* 136* 131*  K 5.0 4.7 4.7  CL 94* 103 100  CO2 21 23 21   GLUCOSE 255* 119* 133*  BUN 23 16 17   CREATININE 0.65 0.61 0.62  CALCIUM 8.6 8.4 8.2*   Estimated Creatinine Clearance: 58 mL/min (by C-G formula based on Cr of 0.62).    Recent Labs  08/09/14 1948 08/09/14 2343 08/11/2014 0402  GLUCAP 120* 176* 129*    Medical History: Past Medical History  Diagnosis Date  . Diabetes mellitus     "prednisone induced" per patient  . Osteoporosis   . Rheumatoid arthritis(714.0)   . Fibromyalgia  Insulin Requirements:  17 units Novolog SSI, on lantus 20mg  daily  Current Nutrition: NPO  IVF: NS @ 75 ml/hr  Central access: CVC placed 08/08/14  TPN start date: 11/12  ASSESSMENT                                                                                                          HPI: 80 y/o F admitted 11/5 with cough & generalized weakness. Work up concerning for CAP and patient admitted for evaluation. Patient had progressive hypoxemia and required mech ventilation. Unable to place OG/NGT d/t large paraesophageal hernia. Pharmacy consulted to dose TPN for protein calorie malnutrition.  Significant events:   Today:   Glucose - BG 110-170s on SSI (hx of DM)  Electrolytes - Na low, other lytes WNL. Corrected Ca WNL   Renal - SCr WNL  LFTs - WNL (11/6)  TGs - pending  Prealbumin - pending  NUTRITIONAL GOALS                                                                                             RD recs: Kcal 1474, Protein 70-80g Fluid: >/=  1.6L Clinimix E 5/15 at a goal rate of 72ml/hr + 20% fat emulsion at 29ml/hr to provide: 72 g/day protein, 1502 Kcal/day.  PLAN                                                                                                                         At 1800 today:  Start Clinimix E 5/15 at 40 ml/hr.  Add regular insulin 10 units to 1L TPN bag (10 units/L) - to deliver 9.6 units regular insulin/24h  20% fat emulsion at 68ml/hr.  Plan to advance as tolerated to the goal rate.  TNA to contain standard multivitamins and trace elements.  Reduce IVF to 4ml/hr.  Continue Resistant scale Novolog SSI .   TNA lab panels on Mondays & Thursdays.  F/u daily.  02-29-1980, PharmD Pager: [phone] 08/11/2014 10:48 AM

## 2014-08-10 NOTE — Procedures (Signed)
Arterial Catheter Insertion Procedure Note Patricia Mosley [id number] [birth year]  Procedure: Insertion of Arterial Catheter  Indications: Blood pressure monitoring and Frequent blood sampling  Procedure Details Consent: Unable to obtain consent because of altered level of consciousness. Time Out: Verified patient identification, verified procedure, site/side was marked, verified correct patient position, special equipment/implants available, medications/allergies/relevent history reviewed, required imaging and test results available.  Performed  Maximum sterile technique was used including antiseptics, cap, gloves, gown, hand hygiene, mask and sheet. Skin prep: Chlorhexidine; local anesthetic administered 20 gauge catheter was inserted into right radial artery using the Seldinger technique.  Evaluation Blood flow good; BP tracing good. Complications: No apparent complications.   Patricia Mosley 07/30/2014

## 2014-08-10 NOTE — Op Note (Signed)
[facility] [address] [place], [address]   ENDOSCOPY PROCEDURE REPORT  PATIENT: Patricia Mosley, Patricia Mosley  MR#: [id number] BIRTHDATE: 08-18-1934 , 80  yrs. old GENDER: female ENDOSCOPIST: Rachael Fee, MD PROCEDURE DATE:  08/19/2014 PROCEDURE:  EGD w/ tube or catheter placement ASA CLASS:     Class IV INDICATIONS:  ARDS, due to pneumonia? Very large HH, unable to place NG tube; also new pneumomediastinum overnight.Marland Kitchen MEDICATIONS: was already intubated, sedated in ICU TOPICAL ANESTHETIC: none  DESCRIPTION OF PROCEDURE: After the risks benefits and alternatives of the procedure were thoroughly explained, informed consent was obtained.  The Pentax Gastroscope [id number] endoscope was introduced through the mouth and advanced to the second portion of the duodenum , Without limitations.  The instrument was slowly withdrawn as the mucosa was fully examined.  There was no evidence of esophageal laceration or perforation. Oropharynx was normal, however visualization was limited due to presence of ET tube.  There was a large hiatal hernia with most of her stomach clearly above the diaphragm, this caused signficant anatomic distortion.  I used the gastroscope to pull a 16Fr NG tube into her distal stomach (via her mouth).  The tip of the OG tube was close to the pylorus and the NG tube did not appear to signficantly move while removing the gastroscope.  The ICU RN clearly labeled the OG tube at patients lip and CXR ordered to get a baseline image.  Retroflexed views revealed a hiatal hernia. The scope was then withdrawn from the patient and the procedure completed.  COMPLICATIONS: There were no immediate complications.  ENDOSCOPIC IMPRESSION: There was no evidence of esophageal laceration or perforation. Oropharynx was normal, however visualization was limited due to presence of ET tube.  There was a large hiatal hernia with most of her stomach clearly above the diaphragm,  this caused signficant anatomic distortion.  I used the gastroscope to pull a 16Fr NG tube into her distal stomach.  The tip of the OG tube was close to the pylorus and the NG tube did not appear to signficantly move while removing the gastroscope.  The ICU RN clearly labeled the OG tube at patients lip and CXR ordered to get a baseline image  RECOMMENDATIONS: The 16Fr OG tube can be used for suction or feedings as needed. Care should be taken to reduce chances of dislodging it.   eSigned:  Rachael Fee, MD 08/08/2014 10:37 AM

## 2014-08-10 NOTE — Progress Notes (Signed)
Called to pt bedside by RN at approximately 0515 for O2 sats in 70s.  Arrived to bedside, RN found bagging, O2 sats slowly improve to 90%.  Pt placed back on vent, fio2 changed to 100%, peep remains at +16 per Dr Delton Coombes on elink camera.  RN aware of changes made.

## 2014-08-10 NOTE — Plan of Care (Signed)
Problem: Phase I Progression Outcomes Goal: Oral Care per Protocol Outcome: Completed/Met Date Met:  08/20/2014 Goal: Sedation Protocol initiated if indicated Outcome: Completed/Met Date Met:  08/19/2014 Goal: ARDS Protocol initiated if indicated Outcome: Completed/Met Date Met:  08/18/2014 Per MD keep at 8 cc/ KG

## 2014-08-10 NOTE — Progress Notes (Signed)
eLink Physician-Brief Progress Note Patient Name: Patricia Mosley DOB: [birth year] MRN: [id number]   Date of Service  08/14/2014  HPI/Events of Note  I had extensive conversation with pt's son about her current status and events of this afternoon.  eICU Interventions  Pt's son confirmed full code status >> she was doing well prior to this current illness, and family is still hopeful that she can recover.     Intervention Category Major Interventions: Other:  Burak Zerbe 08/26/2014, 8:37 PM

## 2014-08-10 NOTE — Progress Notes (Signed)
She remains critically ill and has worsened as the day progressed. Upper endoscopy did not show any pharyngeal or esophageal perforation. Repeat chest x-ray did not show pneumothorax, but showed worsening subcutaneous emphysema, Arterial blood gas showed respiratory acidosis-respiratory rate was increased to 30. To ensure ventilator synchrony, deep sedation and one dose of vecuronium 5 mg was used. I had frequent extensive conversations with her husband and her son, who was driving back from [place] to be with her. He requested that we do perform CPR if that becomes necessary She had a brief arrest around 5:15 PM requiring CPR,oxygenation improved with bagging. Repeat ABG is pending.respiratory rate was increased to 35, peak pressure is around 35 on PEEP 16 I do not think she is a candidate for prone positioning or ECMO at this time.  Additional critical care time x 45 minutes  Oretha Milch. MD

## 2014-08-10 NOTE — Progress Notes (Signed)
PHARMACY BRIEF NOTE - Drug Level Result  Consult for:  Vancomycin Indication:  Pneumonia  The current Vancomycin dose is 1000 mg IV every 12 hours.  A trough level drawn at 18:58 tonight is reported as 20.6 mcg/ml.  This level is slightly above the desired range, 15-20 mcg/ml.  Plan:   Decrease the Vancomycin dose to 750 mg IV every 12 hours.  Polo Riley R.Ph. 08-23-14 11:23 PM

## 2014-08-10 NOTE — Progress Notes (Signed)
Pharmacy - Brief Note  Request from nursing to concentrate phenylephrine gtt d/t high needs, Phenylphrine increased from 10mg  to 40mg  per D5W.  Notified RN that next bag will be concentrated and alaris pump will need changed.   , PharmD, BCPS.   Pager: 08/07/2014 7:05 PM

## 2014-08-10 NOTE — Progress Notes (Signed)
CPR performed on pt, pt ambued with 100% and peep valve.  Pulse returned and pt placed on previous vent settings.

## 2014-08-10 NOTE — Progress Notes (Signed)
ANTIBIOTIC CONSULT NOTE  Pharmacy Consult for vancomycin/cefepime Indication: pneumonia  Allergies  Allergen Reactions  . Acetaminophen     Ineffective.   . Adalimumab     "Sinking spells."  . Allegra [Fexofenadine]     "Serum sickness."   . Allopurinol     "Serum sickness."   . Boniva [Ibandronic Acid]     Oral tablet causes chest pain. IV Boniva OK.    . Ciprofloxacin     "Pains in legs that may have been some mild tendonitis. She could maybe take it again."   . Codeine     Auditory and visual hallucinations.   . Dextromethorphan Nausea And Vomiting  . Enbrel [Etanercept]     "Sinking spells."  . Epinephrine     Unknown.    . Estrogens     "Felt sick."   . Forteo [Teriparatide (Recombinant)]     "Felt weird."   . Gabapentin     Unknown.    . Infliximab     Unknown.    [place] [Levofloxacin]     "Bilateral inflamed achilles tendons."   . Levemir [Insulin Detemir] Itching  . Meperidine Hcl     Unknown.   . Methocarbamol     Unknown.   . Methotrexate     Unknown.    . Other     "Eye dilating medicine." "Makes her 'goofy.'"   . Penicillins     Unknown.   Marland Kitchen Pentazocine Lactate     Unknown.    . Quinine Derivatives Diarrhea and Nausea And Vomiting  . Qvar [Beclomethasone]     "Made throat hurt worse and maybe made her GERD worse so stopped after 4 days."  . Risedronate Sodium     Tolerates IV boniva  . Robitussin (Alcohol Free) [Guaifenesin]     "Made sick."   . Sulfonamide Derivatives     Unknown.   . Tetracycline     Unknown.    . Tramadol Other (See Comments)    Hallucinations   . Doxycycline Diarrhea  . Metronidazole Rash  . Vitamin D2 [Ergocalciferol] Rash    GI upset.     Patient Measurements: Height: 5\' 5"  (165.1 cm) Weight: 160 lb 11.5 oz (72.9 kg) IBW/kg (Calculated) : 57 Adjusted Body Weight:   Vital Signs: Temp: 97.9 F (36.6 C) (11/12 0731) Temp Source: Axillary (11/12 0731) BP: 110/51 mmHg (11/12 1130) Pulse Rate: 79 (11/12  1130) Intake/Output from previous day: 11/11 0701 - 11/12 0700 In: 3754.3 [I.V.:3114.3; IV Piggyback:600] Out: 1205 [Urine:1205] Intake/Output from this shift: Total I/O In: 637.6 [I.V.:427.6; NG/GT:160; IV Piggyback:50] Out: -   Labs:  Recent Labs  08/08/14 0820 08/09/14 0440 08/16/2014 0430  WBC 33.5* 31.2* 25.6*  HGB 9.1* 8.9* 9.0*  PLT 552* 495* 246  CREATININE 0.65 0.61 0.62   Estimated Creatinine Clearance: 58 mL/min (by C-G formula based on Cr of 0.62).  Recent Labs  08/08/14 1903  VANCOTROUGH 8.0*     Microbiology: Recent Results (from the past 720 hour(s))  Culture, blood (routine x 2)     Status: None (Preliminary result)   Collection Time: 08/07/14  8:00 AM  Result Value Ref Range Status   Specimen Description BLOOD LEFT HAND  Final   Special Requests BOTTLES DRAWN AEROBIC ONLY 2 CC  Final   Culture  Setup Time   Final    08/07/2014 14:12 Performed at [place]   Final  BLOOD CULTURE RECEIVED NO GROWTH TO DATE CULTURE WILL BE HELD FOR 5 DAYS BEFORE ISSUING A FINAL NEGATIVE REPORT Performed at [facility]    Report Status PENDING  Incomplete  Culture, blood (routine x 2)     Status: None (Preliminary result)   Collection Time: 08/07/14 11:00 AM  Result Value Ref Range Status   Specimen Description BLOOD LEFT HAND  Final   Special Requests BOTTLES DRAWN AEROBIC AND ANAEROBIC 6 CC EACH  Final   Culture  Setup Time   Final    08/07/2014 14:12 Performed at [facility]    Culture   Final           BLOOD CULTURE RECEIVED NO GROWTH TO DATE CULTURE WILL BE HELD FOR 5 DAYS BEFORE ISSUING A FINAL NEGATIVE REPORT Performed at [facility]    Report Status PENDING  Incomplete  Respiratory virus panel (routine influenza)     Status: None   Collection Time: 08/07/14  1:09 PM  Result Value Ref Range Status   Source - [facility]AN NASOPHARYNGEAL SWAB  Corrected    Comment: CORRECTED ON 11/11 AT 2132: PREVIOUSLY  REPORTED AS NASOPHARYNGEAL SWAB   Respiratory Syncytial Virus A NOT DETECTED  Final   Respiratory Syncytial Virus B NOT DETECTED  Final   Influenza A NOT DETECTED  Final   Influenza B NOT DETECTED  Final   Parainfluenza 1 NOT DETECTED  Final   Parainfluenza 2 NOT DETECTED  Final   Parainfluenza 3 NOT DETECTED  Final   Metapneumovirus NOT DETECTED  Final   Rhinovirus NOT DETECTED  Final   Adenovirus NOT DETECTED  Final   Influenza A H1 NOT DETECTED  Final   Influenza A H3 NOT DETECTED  Final    Comment: (NOTE)       Normal Reference Range for each Analyte: NOT DETECTED Testing performed using the Luminex xTAG Respiratory Viral Panel test kit. The analytical performance characteristics of this assay have been determined by [facility].  The modifications have not been cleared or approved by the FDA. This assay has been validated pursuant to the CLIA regulations and is used for clinical purposes. Performed at [facility], respiratory (NON-Expectorated)     Status: None   Collection Time: 08/07/14  3:54 PM  Result Value Ref Range Status   Specimen Description TRACHEAL ASPIRATE  Final   Special Requests Immunocompromised  Final   Gram Stain   Final    FEW WBC PRESENT, PREDOMINANTLY PMN NO SQUAMOUS EPITHELIAL CELLS SEEN NO ORGANISMS SEEN Performed at [facility]    Culture   Final    RARE CANDIDA ALBICANS Performed at [facility]    Report Status 07/30/2014 FINAL  Final  Pneumocystis smear by DFA     Status: None   Collection Time: 08/08/14  1:22 AM  Result Value Ref Range Status   Specimen Source-[facility]JSRC NASOPHARYNGEAL  Final   Pneumocystis jiroveci Ag NEGATIVE  Final    Comment: Performed at [place] History: Past Medical History  Diagnosis Date  . Diabetes mellitus     "prednisone induced" per patient  . Osteoporosis   . Rheumatoid arthritis(714.0)   . Fibromyalgia     Assessment: 41 YOF admitted 11/5 for weakness and cough, placed on ceftriaxone and azithromycin  for CAP.  She was transferred to ICU 11/8 for hypoxia. CXR with worsening pneumonia. She has multiple  drug allergies/intolerances, including PCN but has tolerated ceftriaxone.  Pharmacy consulted to dose Vancomycin and Cefepime for pneumonia  11/5 >> azithromycin >> 11/8 11/5 >> ceftriaxone >> 11/8 11/8 >>vancomycin >> 11/8 >> cefepime >>   Tmax: afebrile WBCs: elevated, starting to improve (on chronic steroids) Renal: SCr WNL, normalized CrCl = 21ml/min (using Scr = 0.8)  11/9: Respiratory panel: neg 11/9: respiratory/trach culture: (rare candida albicans) F 11/9: blood x 2: ngtd 11/10 pneumocystis jiroveci Ag: negative 11/9: legionella/ strep pneumo: neg  Drug level / dose changes info: 11/10 VT 1900: 8 on Vanc $Remov'500mg'OLEkRJ$  Q12H(and drawn ~10hrs after dose), increase to 1g q12h.  Goal of Therapy:  Vancomycin trough level 15-20 mcg/ml  Plan:   Continue Vancomycin 1g IV q12h  Check prior to 2000 dose tonight  Continue Cefepime 1gm IV q8h  F/u culture results, renal function   Kizzie Furnish, PharmD Pager: [phone] 07/31/2014 1:06 PM

## 2014-08-10 NOTE — Progress Notes (Signed)
eLink Physician-Brief Progress Note Patient Name: Patricia Mosley DOB: 07/03/1934 MRN: [id number]   Date of Service  08/25/2014  HPI/Events of Note  Pt developed hypoxia, bradycardia, and then PEA.   eICU Interventions  CPR started immediately, and placed on bag ventilation through ETT.  She was also given 1 am HCO3.  She had improvement in oxygenation, and ROSC after only few minutes.      Intervention Category Major Interventions: Other:  Loucille Takach 08/01/2014, 5:15 PM

## 2014-08-11 ENCOUNTER — Encounter (HOSPITAL_COMMUNITY): Payer: Self-pay | Admitting: Gastroenterology

## 2014-08-11 ENCOUNTER — Inpatient Hospital Stay (HOSPITAL_COMMUNITY): Payer: Medicare Other

## 2014-08-11 DIAGNOSIS — D649 Anemia, unspecified: Secondary | ICD-10-CM

## 2014-08-11 DIAGNOSIS — N179 Acute kidney failure, unspecified: Secondary | ICD-10-CM

## 2014-08-11 LAB — BLOOD GAS, ARTERIAL
ACID-BASE DEFICIT: 3.9 mmol/L — AB (ref 0.0–2.0)
Acid-base deficit: 10 mmol/L — ABNORMAL HIGH (ref 0.0–2.0)
Acid-base deficit: 6.8 mmol/L — ABNORMAL HIGH (ref 0.0–2.0)
BICARBONATE: 20.4 meq/L (ref 20.0–24.0)
BICARBONATE: 22.6 meq/L (ref 20.0–24.0)
Bicarbonate: 17.8 mEq/L — ABNORMAL LOW (ref 20.0–24.0)
Drawn by: 31814
Drawn by: 103701
Drawn by: 103701
FIO2: 0.9 %
FIO2: 0.9 %
FIO2: 0.9 %
LHR: 35 {breaths}/min
MECHVT: 450 mL
O2 SAT: 94.6 %
O2 SAT: 96.5 %
O2 SAT: 90.9 %
PATIENT TEMPERATURE: 97.7
PATIENT TEMPERATURE: 98.6
PEEP/CPAP: 10 cmH2O
PEEP: 14 cmH2O
PEEP: 10 cmH2O
PO2 ART: 64.5 mmHg — AB (ref 80.0–100.0)
Patient temperature: 94.4
RATE: 35 resp/min
RATE: 35 resp/min
TCO2: 17.4 mmol/L (ref 0–100)
TCO2: 20.6 mmol/L (ref 0–100)
TCO2: 22.5 mmol/L (ref 0–100)
VT: 450 mL
VT: 450 mL
pCO2 arterial: 45.1 mmHg — ABNORMAL HIGH (ref 35.0–45.0)
pCO2 arterial: 54 mmHg — ABNORMAL HIGH (ref 35.0–45.0)
pCO2 arterial: 53.4 mmHg — ABNORMAL HIGH (ref 35.0–45.0)
pH, Arterial: 7.204 — ABNORMAL LOW (ref 7.350–7.450)
pH, Arterial: 7.198 — CL (ref 7.350–7.450)
pH, Arterial: 7.249 — ABNORMAL LOW (ref 7.350–7.450)
pO2, Arterial: 74.4 mmHg — ABNORMAL LOW (ref 80.0–100.0)
pO2, Arterial: 91.1 mmHg (ref 80.0–100.0)

## 2014-08-11 LAB — GLUCOSE, CAPILLARY
GLUCOSE-CAPILLARY: 462 mg/dL — AB (ref 70–99)
GLUCOSE-CAPILLARY: 430 mg/dL — AB (ref 70–99)
GLUCOSE-CAPILLARY: 315 mg/dL — AB (ref 70–99)
Glucose-Capillary: 406 mg/dL — ABNORMAL HIGH (ref 70–99)
Glucose-Capillary: 503 mg/dL — ABNORMAL HIGH (ref 70–99)
Glucose-Capillary: 468 mg/dL — ABNORMAL HIGH (ref 70–99)
Glucose-Capillary: 396 mg/dL — ABNORMAL HIGH (ref 70–99)

## 2014-08-11 LAB — BASIC METABOLIC PANEL
ANION GAP: 15 (ref 5–15)
ANION GAP: 13 (ref 5–15)
BUN: 29 mg/dL — ABNORMAL HIGH (ref 6–23)
BUN: 36 mg/dL — ABNORMAL HIGH (ref 6–23)
CALCIUM: 7.4 mg/dL — AB (ref 8.4–10.5)
CHLORIDE: 90 meq/L — AB (ref 96–112)
CO2: 20 mEq/L (ref 19–32)
CO2: 24 mEq/L (ref 19–32)
CREATININE: 1.47 mg/dL — AB (ref 0.50–1.10)
Calcium: 7.5 mg/dL — ABNORMAL LOW (ref 8.4–10.5)
Chloride: 87 mEq/L — ABNORMAL LOW (ref 96–112)
Creatinine, Ser: 1.17 mg/dL — ABNORMAL HIGH (ref 0.50–1.10)
GFR calc Af Amer: 38 mL/min — ABNORMAL LOW (ref >90)
GFR calc non Af Amer: 43 mL/min — ABNORMAL LOW (ref >90)
GFR calc non Af Amer: 33 mL/min — ABNORMAL LOW (ref >90)
GFR, EST AFRICAN AMERICAN: 50 mL/min — AB (ref >90)
Glucose, Bld: 509 mg/dL — ABNORMAL HIGH (ref 70–99)
Glucose, Bld: 369 mg/dL — ABNORMAL HIGH (ref 70–99)
POTASSIUM: 4.7 meq/L (ref 3.7–5.3)
Potassium: 4.4 mEq/L (ref 3.7–5.3)
SODIUM: 125 meq/L — AB (ref 137–147)
Sodium: 124 mEq/L — ABNORMAL LOW (ref 137–147)

## 2014-08-11 LAB — DIFFERENTIAL
Basophils Absolute: 0 10*3/uL (ref 0.0–0.1)
Basophils Relative: 0 % (ref 0–1)
EOS ABS: 0 10*3/uL (ref 0.0–0.7)
Eosinophils Relative: 0 % (ref 0–5)
Lymphocytes Relative: 1 % — ABNORMAL LOW (ref 12–46)
Lymphs Abs: 0.4 10*3/uL — ABNORMAL LOW (ref 0.7–4.0)
MONO ABS: 2.5 10*3/uL — AB (ref 0.1–1.0)
Monocytes Relative: 6 % (ref 3–12)
Neutro Abs: 39.1 10*3/uL — ABNORMAL HIGH (ref 1.7–7.7)
Neutrophils Relative %: 93 % — ABNORMAL HIGH (ref 43–77)

## 2014-08-11 LAB — SODIUM, URINE, RANDOM: SODIUM UR: 8 meq/L

## 2014-08-11 LAB — TRIGLYCERIDES: TRIGLYCERIDES: 114 mg/dL (ref <150)

## 2014-08-11 LAB — URINE MICROSCOPIC-ADD ON

## 2014-08-11 LAB — URINALYSIS, ROUTINE W REFLEX MICROSCOPIC
GLUCOSE, UA: 500 mg/dL — AB
Ketones, ur: NEGATIVE mg/dL
Leukocytes, UA: NEGATIVE
Nitrite: NEGATIVE
PH: 5 (ref 5.0–8.0)
PROTEIN: 100 mg/dL — AB
SPECIFIC GRAVITY, URINE: 1.022 (ref 1.005–1.030)
Urobilinogen, UA: 0.2 mg/dL (ref 0.0–1.0)

## 2014-08-11 LAB — CBC
HCT: 22.4 % — ABNORMAL LOW (ref 36.0–46.0)
HEMOGLOBIN: 7 g/dL — AB (ref 12.0–15.0)
MCH: 25.7 pg — ABNORMAL LOW (ref 26.0–34.0)
MCHC: 31.3 g/dL (ref 30.0–36.0)
MCV: 82.4 fL (ref 78.0–100.0)
Platelets: ADEQUATE 10*3/uL (ref 150–400)
RBC: 2.72 MIL/uL — AB (ref 3.87–5.11)
RDW: 15 % (ref 11.5–15.5)
WBC: 42 10*3/uL — ABNORMAL HIGH (ref 4.0–10.5)

## 2014-08-11 LAB — ABO/RH: ABO/RH(D): A POS

## 2014-08-11 LAB — PREALBUMIN: PREALBUMIN: 3.6 mg/dL — AB (ref 17.0–34.0)

## 2014-08-11 LAB — PHOSPHORUS: Phosphorus: 4 mg/dL (ref 2.3–4.6)

## 2014-08-11 LAB — CREATININE, URINE, RANDOM: CREATININE, URINE: 120.4 mg/dL

## 2014-08-11 LAB — PREPARE RBC (CROSSMATCH)

## 2014-08-11 LAB — MAGNESIUM: MAGNESIUM: 2.2 mg/dL (ref 1.5–2.5)

## 2014-08-11 MED ORDER — STERILE WATER FOR INJECTION IV SOLN
INTRAVENOUS | Status: DC
  Administered 2014-08-11 (×2): via INTRAVENOUS
  Filled 2014-08-11 (×6): qty 850

## 2014-08-11 MED ORDER — DEXTROSE 5 % IV SOLN
160.0000 mg | Freq: Three times a day (TID) | INTRAVENOUS | Status: DC
  Administered 2014-08-11 (×2): 160 mg via INTRAVENOUS
  Filled 2014-08-11 (×3): qty 16

## 2014-08-11 MED ORDER — INSULIN GLARGINE 100 UNIT/ML ~~LOC~~ SOLN
20.0000 [IU] | Freq: Two times a day (BID) | SUBCUTANEOUS | Status: DC
  Administered 2014-08-11 (×2): 20 [IU] via SUBCUTANEOUS
  Filled 2014-08-11 (×3): qty 0.2

## 2014-08-11 MED ORDER — SODIUM CHLORIDE 0.9 % IV SOLN: Freq: Once | INTRAVENOUS | Status: DC

## 2014-08-11 MED ORDER — CEFEPIME HCL 1 G IJ SOLR
1.0000 g | INTRAMUSCULAR | Status: DC
  Filled 2014-08-11: qty 1

## 2014-08-11 MED ORDER — TRACE MINERALS CR-CU-F-FE-I-MN-MO-SE-ZN IV SOLN
INTRAVENOUS | Status: DC
  Administered 2014-08-11: 18:00:00 via INTRAVENOUS
  Filled 2014-08-11: qty 1000

## 2014-08-11 MED ORDER — FAT EMULSION 20 % IV EMUL
250.0000 mL | INTRAVENOUS | Status: DC
  Administered 2014-08-11: 250 mL via INTRAVENOUS
  Filled 2014-08-11: qty 250

## 2014-08-11 NOTE — Progress Notes (Signed)
PULMONARY / CRITICAL CARE MEDICINE   Name: Patricia Mosley MRN: [id number] DOB: 12/15/1933    ADMISSION DATE:  08/05/2014 CONSULTATION DATE:  08/07/14  REFERRING MD :  Dr. Gonzella Lex   CHIEF COMPLAINT:  Hypoxic Respiratory Failure   INITIAL PRESENTATION: 80 y/o F admitted 11/5 with cough & generalized weakness.  Work up concerning for R CAP and patient admitted for evaluation.  Patient had progressive hypoxemia and PCCM consulted , required mech ventilation. Course complicated by subcutaneous emphysema    STUDIES:  11/07  CT Head >> no acute abnormality, chronic microvascular ischemic white matter disease  11/08  CTA Chest >> no PE, mod bilateral multifocal airspace disease, small B pleural effusions, mod-lg hiatal hernia 11/12 EGD- no perforation, NGT placed  SIGNIFICANT EVENTS: 11/05  Admit per [facility] with CAP, Rx'd as outpatient with azithro & increase in baseline prednisone  11/07  N/V 11/08  Development of worsening R sided airspace disease, CTA chest neg for Pe 11/09  Decompensated, hypoxic requiring intubation 11/10  Improved oxygenation on ABG, CXR with improved airspace disease  11/11 Severe agitation on WUA- causing vent asynchrony & desaturation, unable to place NGT 11/12 brief arrest requiring CPR & bicarb, improved with bagging    SUBJECTIVE: Appears more puffy UO minimal Sedated RASS-2, synchronous with vent  VITAL SIGNS: Temp:  [97.3 F (36.3 C)-97.6 F (36.4 C)] 97.3 F (36.3 C) (11/12 1941) Pulse Rate:  [63-146] 75 (11/13 0515) Resp:  [10-36] 35 (11/13 0700) BP: (80-119)/(41-62) 93/59 mmHg (11/13 0346) SpO2:  [47 %-100 %] 96 % (11/13 0515) Arterial Line BP: (67-144)/(42-63) 95/60 mmHg (11/13 0700) FiO2 (%):  [90 %-100 %] 90 % (11/13 0346) Weight:  [78.1 kg (172 lb 2.9 oz)] 78.1 kg (172 lb 2.9 oz) (11/13 0500)   HEMODYNAMICS: CVP:  [6 mmHg-10 mmHg] 6 mmHg   VENTILATOR SETTINGS: Vent Mode:  [-] PRVC FiO2 (%):  [90 %-100 %] 90 % Set Rate:  [24 bmp-35 bmp] 35  bmp Vt Set:  [440 mL-460 mL] 450 mL PEEP:  [14 cmH20-16 cmH20] 14 cmH20 Plateau Pressure:  [27 cmH20-35 cmH20] 30 cmH20   INTAKE / OUTPUT:  Intake/Output Summary (Last 24 hours) at 08/11/14 0803 Last data filed at 08/11/14 0600  Gross per 24 hour  Intake 5699.73 ml  Output    330 ml  Net 5369.73 ml    PHYSICAL EXAMINATION: General:  Frail elderly female intubated,sedated Neuro:  Intermittent agitation on WUA, RASS -2, HEENT:  OETT, Edentulous, tongue coated w/ white/brown plaque,  sub cutaneous crepitus over neck & arms Cardiovascular:  s1s2 rrr, no m/r/g  Lungs:  resp's even/non-labored, lungs bilaterally coarse, asynchrony+ Abdomen:  Round/soft, bsx4 active  Musculoskeletal:  Finger / toe deformities consistent with RA Skin:  Warm/dry, R eye facial laceration with steri-strip's c/d/i  LABS:  CBC  Recent Labs Lab 08/09/14 0440 2014/08/15 0430 08/11/14 0345  WBC 31.2* 25.6* 42.0*  HGB 8.9* 9.0* 7.0*  HCT 28.1* 29.4* 22.4*  PLT 495* 246 PLATELET CLUMPS NOTED ON SMEAR, COUNT APPEARS ADEQUATE   Coag's No results for input(s): APTT, INR in the last 168 hours.   BMET  Recent Labs Lab 08/09/14 0440 2014-08-15 0430 08/11/14 0345  NA 136* 131* 125*  K 4.7 4.7 4.7  CL 103 100 90*  CO2 23 21 20   BUN 16 17 29*  CREATININE 0.61 0.62 1.17*  GLUCOSE 119* 133* 509*   Electrolytes  Recent Labs Lab 08/07/14 0355  08/09/14 0440 08-15-2014 0430 08/11/14 0345  CALCIUM 9.5  < >  8.4 8.2* 7.5*  MG 2.0  --   --   --  2.2  PHOS  --   --   --   --  4.0  < > = values in this interval not displayed. Sepsis Markers No results for input(s): LATICACIDVEN, PROCALCITON, O2SATVEN in the last 168 hours. ABG  Recent Labs Lab 08/09/2014 1235 08/09/2014 1756 08/11/14 0359  PHART 7.170* 7.275* 7.204*  PCO2ART 55.0* 47.9* 45.1*  PO2ART 55.5* 153.0* 74.4*   Liver Enzymes No results for input(s): AST, ALT, ALKPHOS, BILITOT, ALBUMIN in the last 168 hours. Cardiac Enzymes  Recent  Labs Lab 08/05/14 0909  TROPONINI <0.30   Glucose  Recent Labs Lab 07/30/2014 1317 08/17/2014 1551 08/07/2014 1558 08/26/2014 1939 08/01/2014 2340 08/11/14 0453  GLUCAP 190* 156* 161* 273* 406* 462*    Imaging Dg Chest Port 1 View  08/19/2014   CLINICAL DATA:  Nasogastric tube placement  EXAM: PORTABLE CHEST - 1 VIEW  COMPARISON:  Study obtained earlier in the day.  FINDINGS: Nasogastric tube tip and side port are below the diaphragm. Endotracheal tube tip is 2.6 cm above the carina. Central catheter tip is in the superior vena cava.  There is no appreciable pneumothorax. There is, however, pneumomediastinum as well as extensive subcutaneous emphysema throughout the chest and and neck regions, essentially stable compared to study obtained earlier in the day. There is diffuse interstitial edema with mild patchy alveolar prominence in both upper lobe regions. Heart size and pulmonary vascularity are normal.  IMPRESSION: Widespread subcutaneous emphysema as well as pneumomediastinum. No pneumothorax is demonstrable currently by radiography. Persistent interstitial edema diffusely. Tube and catheter positions as described without pneumothorax. Note that the nasogastric tube tip and side port are below the diaphragm currently.   Electronically Signed   By: Bretta Bang M.D.   On: 08/20/2014 11:07   Dg Chest Port 1 View  08/04/2014   CLINICAL DATA:  Acute respiratory failure.  EXAM: PORTABLE CHEST - 1 VIEW  COMPARISON:  08/26/2014.  FINDINGS: Endotracheal tube in stable position. Left IJ line in stable position. Mediastinum hilar structures are unremarkable. Heart size normal. Diffuse pulmonary interstitial infiltrates are again noted, unchanged. No pleural effusion. Tiny left apical pneumothorax cannot be excluded. Progressive extensive bilateral chest wall and supraclavicular subcutaneous emphysema. No focal osseous lesion.  IMPRESSION: 1. Endotracheal tube and left IJ line in stable position. 2.  Interim severe progression of bilateral chest wall and supraclavicular subcutaneous emphysema. 3. Tiny left apical pneumothorax cannot be excluded. 4. Diffuse unchanged prominent bilateral pulmonary interstitial infiltrates. Critical Value/emergent results were called by telephone at the time of interpretation on 07/31/2014 at 9:18 am to nurse Ruby, who verbally acknowledged these results.   Electronically Signed   By: Maisie Fus  Register   On: 08/04/2014 09:20   Dg Chest Port 1 View  08/18/2014   CLINICAL DATA:  Respiratory failure  EXAM: PORTABLE CHEST - 1 VIEW  COMPARISON:  August 09, 2014  FINDINGS: There is been interval removal of nasogastric tube. Endotracheal tube tip is 2.0 cm above the carina. Central catheter tip is in the superior vena cava. No pneumothorax is apparent. However, there is now pneumomediastinum as well as extensive subcutaneous air in the neck regions bilaterally.  There is generalized interstitial edema with patchy areas of consolidation in the left upper lobe and right upper lobe and mid lung regions. There is no new opacity. The heart size is normal. There is a hiatal hernia. Pulmonary vascularity is normal. No adenopathy.  IMPRESSION: Tube and catheter positions as described without pneumothorax. There is no convincing pneumothorax. However, there is extensive pneumomediastinum as well as extensive subcutaneous air throughout the neck and supraclavicular regions.  There remains interstitial edema with patchy alveolar consolidation, stable. No new opacity. No change in cardiac silhouette.   Electronically Signed   By: Bretta Bang M.D.   On: 08-29-14 07:21     ASSESSMENT / PLAN:  PULMONARY OETT 11/9 >>  A: Acute Hypoxic Respiratory Failure -  ARDS Diffuse Bilateral Airspace Disease - DDx includes aspiration (hiatal hernia + vomiting), viral / bacterial etiology, opportunistic infection / MAI (plaquenil + prednisone 7.5mg ), viral pneumonitis, rheumatoid interstitial  disease.   Small Bilateral Pleural Effusions  Vent asynchrony Sub cutaneous air - concern for barotrauma -no pneumothorax P:   Q3 PRN Albuterol  ARDS protocol -keep Tv at 8cc/kg,keep RR 35, titrate PEEP lower as FIO2 improves, drop to 10 - will accept higher FIO2 Keep RASS-2 for synchrony on ARDS protocol   CARDIOVASCULAR CVL  A: Septic shock  P:  Wean Neo gtt for MAP > 65 NS @ 75  CVP at goal   RENAL A:  AKI Metab acidosis Mild Hyperkalemia  P:   Replace electrolytes as indicated  Treat medically -but may need HD  GASTROINTESTINAL A:   Large Paraesophageal Hernia -unable to place OG/NGT bedside or IR, NG placed by GI  Protein Calorie Malnutrition P:   Pepcid for SUP On protonix + pepcid at home, hold protonix with abx & C-diff risk Consider trickle TFs in 24h   HEMATOLOGIC A:   Leukocytosis -worse Anemia of critical illness P:  Trend CBC Ct SQ Heparin -low threshold to stop if Hb drops further 1u PRBC, goal Hb 7 & above  INFECTIOUS A:   Bilateral Diffuse Infiltrates  P:   BCx2 11/9 >>ng Sputum 11/9 >> rare candida RVP 11/9 >> neg PCP 11/9 >>neg  Strep Pneumo 11/9 >> neg Abx: Azithro, start date 11/6 x 1 dose Abx: Rocephin, start date 11/5, x1 dose  Abx: Vanco, start date 11/8 Abx: Cefepime, start date 11/8    ENDOCRINE A:   DM II -uncontrolled, on TNA Rheumatoid Arthritis - on plaquenil + prednisone 5 mg at baseline P:   SSI Increase Lantus 20 units q 12h Drop dextrose in TNA formulation Stress dose hydrocort  NEUROLOGIC A:   Fibromyalgia  Pain - ICU discomfort + ? Fibro flare Mechanical Fall P:   RASS goal: 0 Fentanyl PAD protocol  Ct versed  gtt   FAMILY  - Updates: husband & son [name] (ophthalmologist)11/13  - Inter-disciplinary family meet or Palliative Care meeting -done, family not receptive at this time to palliative conversations & would not want this  Summary - Worsening ARDS, sub cutaneous air , no pneumothorax or  pharyngeal/esophageal leak - now in renal failure & increasing pressors. Prognosis poor & family understand this, would still like [place] to continue resuscitation measures including dialysis if needed.   The patient is critically ill with multiple organ systems failure and requires high complexity decision making for assessment and support, frequent evaluation and titration of therapies, application of advanced monitoring technologies and extensive interpretation of multiple databases. Critical Care Time devoted to patient care services described in this note is 45 minutes.   Cyril Mourning MD. Tonny Bollman. [facility] Pager [phone] If no response call [phone]   08/11/2014, 8:03 AM

## 2014-08-11 NOTE — Progress Notes (Signed)
ANTIBIOTIC CONSULT NOTE  Pharmacy Consult for vancomycin/cefepime Indication: pneumonia  Allergies  Allergen Reactions  . Acetaminophen     Ineffective.   . Adalimumab     "Sinking spells."  . Allegra [Fexofenadine]     "Serum sickness."   . Allopurinol     "Serum sickness."   . Boniva [Ibandronic Acid]     Oral tablet causes chest pain. IV Boniva OK.    . Ciprofloxacin     "Pains in legs that may have been some mild tendonitis. She could maybe take it again."   . Codeine     Auditory and visual hallucinations.   . Dextromethorphan Nausea And Vomiting  . Enbrel [Etanercept]     "Sinking spells."  . Epinephrine     Unknown.    . Estrogens     "Felt sick."   . Forteo [Teriparatide (Recombinant)]     "Felt weird."   . Gabapentin     Unknown.    . Infliximab     Unknown.    Mack Hook [Levofloxacin]     "Bilateral inflamed achilles tendons."   . Levemir [Insulin Detemir] Itching  . Meperidine Hcl     Unknown.   . Methocarbamol     Unknown.   . Methotrexate     Unknown.    . Other     "Eye dilating medicine." "Makes her 'goofy.'"   . Penicillins     Unknown.   Marland Kitchen Pentazocine Lactate     Unknown.    . Quinine Derivatives Diarrhea and Nausea And Vomiting  . Qvar [Beclomethasone]     "Made throat hurt worse and maybe made her GERD worse so stopped after 4 days."  . Risedronate Sodium     Tolerates IV boniva  . Robitussin (Alcohol Free) [Guaifenesin]     "Made sick."   . Sulfonamide Derivatives     Unknown.   . Tetracycline     Unknown.    . Tramadol Other (See Comments)    Hallucinations   . Doxycycline Diarrhea  . Metronidazole Rash  . Vitamin D2 [Ergocalciferol] Rash    GI upset.     Patient Measurements: Height: 5\' 5"  (165.1 cm) Weight: 172 lb 2.9 oz (78.1 kg) IBW/kg (Calculated) : 57 Adjusted Body Weight:   Vital Signs: BP: 93/59 mmHg (11/13 0346) Pulse Rate: 75 (11/13 0515) Intake/Output from previous day: 11/12 0701 - 11/13 0700 In: 5699.7  [I.V.:4151.4; NG/GT:260; IV Piggyback:700; TPN:588.3] Out: 330 [Urine:330] Intake/Output from this shift:    Labs:  Recent Labs  08/09/14 0440 08/01/2014 0430 08/11/14 0345  WBC 31.2* 25.6* 42.0*  HGB 8.9* 9.0* 7.0*  PLT 495* 246 PLATELET CLUMPS NOTED ON SMEAR, COUNT APPEARS ADEQUATE  CREATININE 0.61 0.62 1.17*   Estimated Creatinine Clearance: 40.9 mL/min (by C-G formula based on Cr of 1.17).  Recent Labs  08/08/14 1903 08/03/2014 1858  VANCOTROUGH 8.0* 20.6*     Microbiology: Recent Results (from the past 720 hour(s))  Culture, blood (routine x 2)     Status: None (Preliminary result)   Collection Time: 08/07/14  8:00 AM  Result Value Ref Range Status   Specimen Description BLOOD LEFT HAND  Final   Special Requests BOTTLES DRAWN AEROBIC ONLY 2 CC  Final   Culture  Setup Time   Final    08/07/2014 14:12 Performed at [facility]    Culture   Final           BLOOD CULTURE RECEIVED NO GROWTH TO DATE  CULTURE WILL BE HELD FOR 5 DAYS BEFORE ISSUING A FINAL NEGATIVE REPORT Performed at [facility]    Report Status PENDING  Incomplete  Culture, blood (routine x 2)     Status: None (Preliminary result)   Collection Time: 08/07/14 11:00 AM  Result Value Ref Range Status   Specimen Description BLOOD LEFT HAND  Final   Special Requests BOTTLES DRAWN AEROBIC AND ANAEROBIC 6 CC EACH  Final   Culture  Setup Time   Final    08/07/2014 14:12 Performed at [facility]    Culture   Final           BLOOD CULTURE RECEIVED NO GROWTH TO DATE CULTURE WILL BE HELD FOR 5 DAYS BEFORE ISSUING A FINAL NEGATIVE REPORT Performed at [facility]    Report Status PENDING  Incomplete  Respiratory virus panel (routine influenza)     Status: None   Collection Time: 08/07/14  1:09 PM  Result Value Ref Range Status   Source - [facility] NASOPHARYNGEAL SWAB  Corrected    Comment: CORRECTED ON 11/11 AT 2132: PREVIOUSLY REPORTED AS NASOPHARYNGEAL SWAB   Respiratory  Syncytial Virus A NOT DETECTED  Final   Respiratory Syncytial Virus B NOT DETECTED  Final   Influenza A NOT DETECTED  Final   Influenza B NOT DETECTED  Final   Parainfluenza 1 NOT DETECTED  Final   Parainfluenza 2 NOT DETECTED  Final   Parainfluenza 3 NOT DETECTED  Final   Metapneumovirus NOT DETECTED  Final   Rhinovirus NOT DETECTED  Final   Adenovirus NOT DETECTED  Final   Influenza A H1 NOT DETECTED  Final   Influenza A H3 NOT DETECTED  Final    Comment: (NOTE)       Normal Reference Range for each Analyte: NOT DETECTED Testing performed using the Luminex xTAG Respiratory Viral Panel test kit. The analytical performance characteristics of this assay have been determined by [facility].  The modifications have not been cleared or approved by the FDA. This assay has been validated pursuant to the CLIA regulations and is used for clinical purposes. Performed at [facility], respiratory (NON-Expectorated)     Status: None   Collection Time: 08/07/14  3:54 PM  Result Value Ref Range Status   Specimen Description TRACHEAL ASPIRATE  Final   Special Requests Immunocompromised  Final   Gram Stain   Final    FEW WBC PRESENT, PREDOMINANTLY PMN NO SQUAMOUS EPITHELIAL CELLS SEEN NO ORGANISMS SEEN Performed at [facility]    Culture   Final    RARE CANDIDA ALBICANS Performed at [facility]    Report Status 07/30/2014 FINAL  Final  Pneumocystis smear by DFA     Status: None   Collection Time: 08/08/14  1:22 AM  Result Value Ref Range Status   Specimen Source-[facility] NASOPHARYNGEAL  Final   Pneumocystis jiroveci Ag NEGATIVE  Final    Comment: Performed at [facility] History: Past Medical History  Diagnosis Date  . Diabetes mellitus     "prednisone induced" per patient  . Osteoporosis   . Rheumatoid arthritis(714.0)   . Fibromyalgia    Assessment: 66 YOF admitted 11/5 for weakness and  cough, placed on ceftriaxone and azithromycin for CAP. She was transferred to ICU 11/8 for hypoxia. CXR with worsening pneumonia. She has multiple drug allergies/intolerances, including PCN but has tolerated ceftriaxone. Orders  to change antibiotics to vancomycin and cefepime.   11/5 >> azithromycin >> 11/8 11/5 >> ceftriaxone >> 11/8 11/8 >>vancomycin >> 11/8 >> cefepime >>   Tmax: afebrile WBCs: elevated (on chronic steroids) Renal: SCr increased to 1.17, 41CG, 44N  11/9: Respiratory panel: neg 11/9: respiratory/trach culture: (rare candida albicans) F 11/9: blood x 2: ngtd 11/10 pneumocystis jiroveci Ag: negative 11/9: legionella/ strep pneumo: neg  Goal of Therapy:  Vancomycin trough level 15-20 mcg/ml  Appropriate antibiotic dosing for renal function; eradication of infection  Plan:   Continue Vancomycin 500mg  IV q12h.  Change Cefepime from 1g IV q8h to 1g q24h.  F/u culture results, renal function  Romeo Rabon, PharmD, pager [phone]. 08/11/2014,8:13 AM.

## 2014-08-11 NOTE — Progress Notes (Signed)
PARENTERAL NUTRITION CONSULT NOTE - Follow up  Pharmacy Consult for TPN Indication: unable to use enteral nutrition d/t paraesophageal hernia  Allergies  Allergen Reactions  . Acetaminophen     Ineffective.   . Adalimumab     "Sinking spells."  . Allegra [Fexofenadine]     "Serum sickness."   . Allopurinol     "Serum sickness."   . Boniva [Ibandronic Acid]     Oral tablet causes chest pain. IV Boniva OK.    . Ciprofloxacin     "Pains in legs that may have been some mild tendonitis. She could maybe take it again."   . Codeine     Auditory and visual hallucinations.   . Dextromethorphan Nausea And Vomiting  . Enbrel [Etanercept]     "Sinking spells."  . Epinephrine     Unknown.    . Estrogens     "Felt sick."   . Forteo [Teriparatide (Recombinant)]     "Felt weird."   . Gabapentin     Unknown.    . Infliximab     Unknown.    Barbera Setters [Levofloxacin]     "Bilateral inflamed achilles tendons."   . Levemir [Insulin Detemir] Itching  . Meperidine Hcl     Unknown.   . Methocarbamol     Unknown.   . Methotrexate     Unknown.    . Other     "Eye dilating medicine." "Makes her 'goofy.'"   . Penicillins     Unknown.   Marland Kitchen Pentazocine Lactate     Unknown.    . Quinine Derivatives Diarrhea and Nausea And Vomiting  . Qvar [Beclomethasone]     "Made throat hurt worse and maybe made her GERD worse so stopped after 4 days."  . Risedronate Sodium     Tolerates IV boniva  . Robitussin (Alcohol Free) [Guaifenesin]     "Made sick."   . Sulfonamide Derivatives     Unknown.   . Tetracycline     Unknown.    . Tramadol Other (See Comments)    Hallucinations   . Doxycycline Diarrhea  . Metronidazole Rash  . Vitamin D2 [Ergocalciferol] Rash    GI upset.     Patient Measurements: Height: 5\' 5"  (165.1 cm) Weight: 172 lb 2.9 oz (78.1 kg) IBW/kg (Calculated) : 57   Vital Signs: BP: 93/59 mmHg (11/13 0346) Pulse Rate: 79 (11/13 1036) Intake/Output from previous day: 11/12  0701 - 11/13 0700 In: 5699.7 [I.V.:4151.4; NG/GT:260; IV Piggyback:700; TPN:588.3] Out: 330 [Urine:330] Intake/Output from this shift:    Labs:  Recent Labs  08/09/14 0440 08/05/2014 0430 08/11/14 0345  WBC 31.2* 25.6* 42.0*  HGB 8.9* 9.0* 7.0*  HCT 28.1* 29.4* 22.4*  PLT 495* 246 PLATELET CLUMPS NOTED ON SMEAR, COUNT APPEARS ADEQUATE     Recent Labs  08/09/14 0440 08/24/2014 0430 08/11/14 0345  NA 136* 131* 125*  K 4.7 4.7 4.7  CL 103 100 90*  CO2 23 21 20   GLUCOSE 119* 133* 509*  BUN 16 17 29*  CREATININE 0.61 0.62 1.17*  CALCIUM 8.4 8.2* 7.5*  MG  --   --  2.2  PHOS  --   --  4.0  TRIG  --   --  114   Estimated Creatinine Clearance: 40.9 mL/min (by C-G formula based on Cr of 1.17).    Recent Labs  07/30/2014 2340 08/11/14 0453 08/11/14 0830  GLUCAP 406* 462* 503*    Medical History: Past Medical History  Diagnosis Date  .  Diabetes mellitus     "prednisone induced" per patient  . Osteoporosis   . Rheumatoid arthritis(714.0)   . Fibromyalgia     Insulin Requirements:  51 units Novolog SSI, on lantus 20mg  daily.  Current Nutrition: NPO  IVF: Bicarb/SWI @125  ml/hr  Central access: CVC placed 08/08/14  TPN start date: 11/12  ASSESSMENT                                                                                                          HPI: 80 y/o F admitted 11/5 with cough & generalized weakness. Work up concerning for CAP and patient admitted for evaluation. Patient had progressive hypoxemia and required mech ventilation. Unable to place OG/NGT d/t large paraesophageal hernia. Pharmacy consulted to dose TPN for protein calorie malnutrition.  Significant events:  11/13: NG placed but holding off using for feeding. May start trickle feeds soon.  Today:   Glucose - 273-509 w/ TNA start. SSI resist q4h. MD doubled Lantus today. On hydrocortisone. Off home Januvia.  Electrolytes - Na low, other lytes WNL. Corrected Ca WNL.   Renal - SCr  rising  LFTs - WNL (11/6)  TGs - 114  Prealbumin - pending  NUTRITIONAL GOALS                                                                                             RD recs: Kcal 1474, Protein 70-80g Fluid: >/= 1.6L Clinimix E 5/15 at a goal rate of 57ml/hr + 20% fat emulsion at 47ml/hr to provide: 72 g/day protein, 1502 Kcal/day.  PLAN                                                                                                                          Start Clinimix E 5/15 at 40 ml/hr.  Increase regular insulin to 30 units to 1L TPN bag - to deliver 29 u/24h.  20% fat emulsion at 44ml/hr.  Plan to advance as tolerated to the goal rate.  TNA to contain standard multivitamins and trace elements.  Continue Resistant scale Novolog SSI q4h.   TNA lab panels on Mondays & Thursdays.  Cmet, phos, mag in am.  F/u daily.  Charolotte Eke, PharmD, pager [phone]. 08/11/2014,11:36 AM.

## 2014-08-11 NOTE — Procedures (Signed)
Central Venous Catheter Insertion Procedure Note Patricia Mosley [id number] 11-13-1933  Procedure: Insertion of Central Venous Catheter Indications: hemodialysis  Procedure Details Consent: Risks of procedure as well as the alternatives and risks of each were explained to the (patient/caregiver).  Consent for procedure obtained. Time Out: Verified patient identification, verified procedure, site/side was marked, verified correct patient position, special equipment/implants available, medications/allergies/relevent history reviewed, required imaging and test results available.  Performed  Maximum sterile technique was used including antiseptics, cap, gloves, gown, hand hygiene, mask and sheet. Skin prep: Chlorhexidine; local anesthetic administered A antimicrobial bonded/coated triple lumen catheter was placed in the right femoral vein due to emergent situation using the Seldinger technique.  Evaluation Blood flow good Complications: No apparent complications Patient did tolerate procedure well. Chest X-ray ordered to verify placement.    Ultrasound used for site verification, live visualisation of needle entry & guidewire prior to dilation  ALVA,RAKESH V. 08/11/2014, 10:31 AM

## 2014-08-11 NOTE — Consult Note (Signed)
Patricia Mosley is an 80 y.o. female referred by Dr Vassie Loll   Chief Complaint: ARF HPI: 80yo WF admitted 2014/08/09 for weakness and found to have PNA.  Her resp status deteriorated and was intubated on 08/07/14.  Normal renal fx on admission.  Had contrasted CT of chest on 08/06/14 but seems to have tolerated well as Scr remained at .6 thru 08/23/2014.  UO has been > 1L per day until this AM.  According to I/O's, she is +12 liters fluid since admission. SBP decreased into the 90's yest and she had a brief code requiring CPR.  Since then her UO has decreased and Scr increased to 1.17  Past Medical History  Diagnosis Date  . Diabetes mellitus     "prednisone induced" per patient  . Osteoporosis   . Rheumatoid arthritis(714.0)   . Fibromyalgia     Past Surgical History  Procedure Laterality Date  . Tonsillectomy    . "womb suspension" and appendectomy      age 38  . Dilation and curettage of uterus    . Cystoscopy      evaluating UTI  . Esophagogastroduodenoscopy N/A 07/30/2014    Procedure: ESOPHAGOGASTRODUODENOSCOPY (EGD);  Surgeon: Rachael Fee, MD;  Location: [facility];  Service: Endoscopy;  Laterality: N/A;    Family History  Problem Relation Age of Onset  . Heart disease Mother    Social History:  reports that she has never smoked. She has never used smokeless tobacco. She reports that she does not drink alcohol or use illicit drugs.  Allergies:  Allergies  Allergen Reactions  . Acetaminophen     Ineffective.   . Adalimumab     "Sinking spells."  . Allegra [Fexofenadine]     "Serum sickness."   . Allopurinol     "Serum sickness."   . Boniva [Ibandronic Acid]     Oral tablet causes chest pain. IV Boniva OK.    . Ciprofloxacin     "Pains in legs that may have been some mild tendonitis. She could maybe take it again."   . Codeine     Auditory and visual hallucinations.   . Dextromethorphan Nausea And Vomiting  . Enbrel [Etanercept]     "Sinking spells."  . Epinephrine      Unknown.    . Estrogens     "Felt sick."   . Forteo [Teriparatide (Recombinant)]     "Felt weird."   . Gabapentin     Unknown.    . Infliximab     Unknown.    Barbera Setters [Levofloxacin]     "Bilateral inflamed achilles tendons."   . Levemir [Insulin Detemir] Itching  . Meperidine Hcl     Unknown.   . Methocarbamol     Unknown.   . Methotrexate     Unknown.    . Other     "Eye dilating medicine." "Makes her 'goofy.'"   . Penicillins     Unknown.   Marland Kitchen Pentazocine Lactate     Unknown.    . Quinine Derivatives Diarrhea and Nausea And Vomiting  . Qvar [Beclomethasone]     "Made throat hurt worse and maybe made her GERD worse so stopped after 4 days."  . Risedronate Sodium     Tolerates IV boniva  . Robitussin (Alcohol Free) [Guaifenesin]     "Made sick."   . Sulfonamide Derivatives     Unknown.   . Tetracycline     Unknown.    . Tramadol Other (See Comments)  Hallucinations   . Doxycycline Diarrhea  . Metronidazole Rash  . Vitamin D2 [Ergocalciferol] Rash    GI upset.     Medications Prior to Admission  Medication Sig Dispense Refill  . albuterol (PROVENTIL HFA;VENTOLIN HFA) 108 (90 BASE) MCG/ACT inhaler Inhale 1 puff into the lungs every 6 (six) hours as needed for wheezing or shortness of breath.    . calcium-vitamin D (OSCAL WITH D) 500-200 MG-UNIT per tablet Take 1 tablet by mouth daily.      . cholecalciferol (VITAMIN D) 1000 UNITS tablet Take 1,000 Units by mouth daily.    Marland Kitchen FLUoxetine (PROZAC) 20 MG capsule Take 20 mg by mouth daily.      . folic acid (FOLVITE) 1 MG tablet Take 1 mg by mouth 2 (two) times daily.      . hydroxychloroquine (PLAQUENIL) 200 MG tablet Take 200 mg by mouth 2 (two) times daily.      Marland Kitchen ibandronate (BONIVA) 3 MG/3ML SOLN injection Inject 3 mLs (3 mg total) into the vein once. 3.6 mL 0  . Insulin Glargine (TOUJEO SOLOSTAR) 300 UNIT/ML SOPN Inject 8 Units into the skin daily. Before breakfast    . LORazepam (ATIVAN) 0.5 MG tablet Take  0.5 mg by mouth Nightly.      . morphine 20 MG/5ML solution Take 5-10 mg by mouth every 2 (two) hours as needed for pain.    Marland Kitchen NITROSTAT 0.4 MG SL tablet Place 0.4 mg under the tongue every 5 (five) minutes as needed for chest pain.     Marland Kitchen PANTOPRAZOLE SODIUM PO Take 1 tablet by mouth every morning.    . predniSONE (DELTASONE) 1 MG tablet Take 2 mg by mouth daily with breakfast. Take with prednisone 5 mg    . predniSONE (DELTASONE) 5 MG tablet Take 5 mg by mouth daily. Take with prednisone 2 mg    . Probiotic Product (ALIGN PO) Take 1 capsule by mouth daily.    . promethazine (PHENERGAN) 12.5 MG tablet Take 12.5 mg by mouth every 6 (six) hours as needed for nausea or vomiting.     . ranitidine (ZANTAC) 150 MG capsule Take 150 mg by mouth 2 (two) times daily.      . sitaGLIPtin (JANUVIA) 50 MG tablet Take 100 mg by mouth daily.     . pantoprazole (PROTONIX) 20 MG tablet Take 40 mg by mouth daily.     . sucralfate (CARAFATE) 1 G tablet Take 1 tablet (1 g total) by mouth 4 (four) times daily. (Patient not taking: Reported on 07/30/2014) 30 tablet 0     Lab Results: UA: NL when done on admission   Recent Labs  08/09/14 0440 08-28-14 0430 08/11/14 0345  WBC 31.2* 25.6* 42.0*  HGB 8.9* 9.0* 7.0*  HCT 28.1* 29.4* 22.4*  PLT 495* 246 PLATELET CLUMPS NOTED ON SMEAR, COUNT APPEARS ADEQUATE   BMET  Recent Labs  08/09/14 0440 August 28, 2014 0430 08/11/14 0345  NA 136* 131* 125*  K 4.7 4.7 4.7  CL 103 100 90*  CO2 23 21 20   GLUCOSE 119* 133* 509*  BUN 16 17 29*  CREATININE 0.61 0.62 1.17*  CALCIUM 8.4 8.2* 7.5*  PHOS  --   --  4.0   LFT No results for input(s): PROT, ALBUMIN, AST, ALT, ALKPHOS, BILITOT, BILIDIR, IBILI in the last 72 hours. Dg Chest Port 1 View  08/11/2014   CLINICAL DATA:  Respiratory failure.  Subcutaneous emphysema.  EXAM: PORTABLE CHEST - 1 VIEW  COMPARISON:  08-28-14.  FINDINGS: Endotracheal tube in stable position approximately 1.3 cm above the carina. Slight  proximal repositioning should be considered. Left IJ line and NG tube in stable position. Unchanged diffuse interstitial prominence. Right mid lung field subsegmental atelectasis and or infiltrate Stable cardiac size. Normal pulmonary vascularity. No pleural effusion. No definite pneumothorax is identified. Tiny pneumothorax with be difficult to detect due to extensive overlying neck and chest wall diffuse subcutaneous emphysema.  IMPRESSION: 1. Stable line and tube positions. Endotracheal tube 1.3 cm above the carina. Slight proximal repositioning may prove useful. 2. Extensive diffuse bilateral neck and chest wall subcutaneous emphysema. No definite pneumothorax identified. A tiny pneumothorax with be difficult to detect due to extensive subcutaneous emphysema. 3. Persistent diffuse interstitial prominence. Right mid lung field subsegmental atelectasis and/or focal infiltrate .   Electronically Signed   By: Maisie Fus  Register   On: 08/11/2014 07:52   Dg Chest Port 1 View  08/16/2014   CLINICAL DATA:  Nasogastric tube placement  EXAM: PORTABLE CHEST - 1 VIEW  COMPARISON:  Study obtained earlier in the day.  FINDINGS: Nasogastric tube tip and side port are below the diaphragm. Endotracheal tube tip is 2.6 cm above the carina. Central catheter tip is in the superior vena cava.  There is no appreciable pneumothorax. There is, however, pneumomediastinum as well as extensive subcutaneous emphysema throughout the chest and and neck regions, essentially stable compared to study obtained earlier in the day. There is diffuse interstitial edema with mild patchy alveolar prominence in both upper lobe regions. Heart size and pulmonary vascularity are normal.  IMPRESSION: Widespread subcutaneous emphysema as well as pneumomediastinum. No pneumothorax is demonstrable currently by radiography. Persistent interstitial edema diffusely. Tube and catheter positions as described without pneumothorax. Note that the nasogastric tube  tip and side port are below the diaphragm currently.   Electronically Signed   By: Bretta Bang M.D.   On: 08-16-2014 11:07   Dg Chest Port 1 View  08/16/14   CLINICAL DATA:  Acute respiratory failure.  EXAM: PORTABLE CHEST - 1 VIEW  COMPARISON:  08-16-2014.  FINDINGS: Endotracheal tube in stable position. Left IJ line in stable position. Mediastinum hilar structures are unremarkable. Heart size normal. Diffuse pulmonary interstitial infiltrates are again noted, unchanged. No pleural effusion. Tiny left apical pneumothorax cannot be excluded. Progressive extensive bilateral chest wall and supraclavicular subcutaneous emphysema. No focal osseous lesion.  IMPRESSION: 1. Endotracheal tube and left IJ line in stable position. 2. Interim severe progression of bilateral chest wall and supraclavicular subcutaneous emphysema. 3. Tiny left apical pneumothorax cannot be excluded. 4. Diffuse unchanged prominent bilateral pulmonary interstitial infiltrates. Critical Value/emergent results were called by telephone at the time of interpretation on 16-Aug-2014 at 9:18 am to nurse Ruby, who verbally acknowledged these results.   Electronically Signed   By: Maisie Fus  Register   On: 08-16-14 09:20   Dg Chest Port 1 View  08/16/2014   CLINICAL DATA:  Respiratory failure  EXAM: PORTABLE CHEST - 1 VIEW  COMPARISON:  August 09, 2014  FINDINGS: There is been interval removal of nasogastric tube. Endotracheal tube tip is 2.0 cm above the carina. Central catheter tip is in the superior vena cava. No pneumothorax is apparent. However, there is now pneumomediastinum as well as extensive subcutaneous air in the neck regions bilaterally.  There is generalized interstitial edema with patchy areas of consolidation in the left upper lobe and right upper lobe and mid lung regions. There is no new opacity. The heart size is normal. There  is a hiatal hernia. Pulmonary vascularity is normal. No adenopathy.  IMPRESSION: Tube and  catheter positions as described without pneumothorax. There is no convincing pneumothorax. However, there is extensive pneumomediastinum as well as extensive subcutaneous air throughout the neck and supraclavicular regions.  There remains interstitial edema with patchy alveolar consolidation, stable. No new opacity. No change in cardiac silhouette.   Electronically Signed   By: Bretta Bang M.D.   On: 08/23/2014 07:21    ROS: intubated and sedated  PHYSICAL EXAM: Blood pressure 93/59, pulse 79, temperature 97.3 F (36.3 C), temperature source Oral, resp. rate 35, height 5\' 5"  (1.651 m), weight 78.1 kg (172 lb 2.9 oz), SpO2 97 %. HEENT: intubated NECK:Lt IJ triple lumen LUNGS:Bil crackles ant CARDIAC:RRR wo MRG ABD:+ BS Nondistended, soft EXT:no edema in lower ext.  Weeping in forearms and thus they are bandaged NEURO: sedated  Assessment: 1. ARF most likely secondary to ischemic ATN 2. VDRF 3. Hypotension on pressors  PLAN: 1. Check UA, UNa Ucr 2. IV lasix to stimulate UO 3. Discussed the role of CVVHD with husband and son if UO does not improve in next 1-2d 4. Expect renal fx to recover as she recovers especially as related to her BP though she may require CVVHD before that occurs 5. Recheck labs in AM   Dakari Cregger T 08/11/2014, 11:48 AM

## 2014-08-11 NOTE — Progress Notes (Addendum)
[facility] Progress Note    Since last GI note: Events of last night reviewed.  I spoke with her son and husband in room today.  Objective: Vital signs in last 24 hours: Temp:  [97.3 F (36.3 C)-97.6 F (36.4 C)] 97.3 F (36.3 C) (11/12 1941) Pulse Rate:  [63-146] 75 (11/13 0515) Resp:  [10-36] 35 (11/13 0700) BP: (80-119)/(41-62) 93/59 mmHg (11/13 0346) SpO2:  [47 %-100 %] 96 % (11/13 0515) Arterial Line BP: (67-144)/(42-63) 95/60 mmHg (11/13 0700) FiO2 (%):  [90 %-100 %] 90 % (11/13 0346) Weight:  [172 lb 2.9 oz (78.1 kg)] 172 lb 2.9 oz (78.1 kg) (11/13 0500) Last BM Date: 08/24/2014 General: alert and oriented times 0, intubated, on pressors; several neck lines in place Heart: regular rate and rythm Abdomen: soft, non-tender, non-distended, NO bowel sounds   Lab Results:  Recent Labs  08/09/14 0440 08/27/2014 0430 08/11/14 0345  WBC 31.2* 25.6* 42.0*  HGB 8.9* 9.0* 7.0*  PLT 495* 246 PLATELET CLUMPS NOTED ON SMEAR, COUNT APPEARS ADEQUATE  MCV 79.4 81.0 82.4    Recent Labs  08/09/14 0440 08/14/2014 0430 08/11/14 0345  NA 136* 131* 125*  K 4.7 4.7 4.7  CL 103 100 90*  CO2 23 21 20   GLUCOSE 119* 133* 509*  BUN 16 17 29*  CREATININE 0.61 0.62 1.17*  CALCIUM 8.4 8.2* 7.5*   Studies/Results: Dg Chest Port 1 View  08/11/2014   CLINICAL DATA:  Respiratory failure.  Subcutaneous emphysema.  EXAM: PORTABLE CHEST - 1 VIEW  COMPARISON:  08/23/2014.  FINDINGS: Endotracheal tube in stable position approximately 1.3 cm above the carina. Slight proximal repositioning should be considered. Left IJ line and NG tube in stable position. Unchanged diffuse interstitial prominence. Right mid lung field subsegmental atelectasis and or infiltrate Stable cardiac size. Normal pulmonary vascularity. No pleural effusion. No definite pneumothorax is identified. Tiny pneumothorax with be difficult to detect due to extensive overlying neck and chest wall diffuse subcutaneous  emphysema.  IMPRESSION: 1. Stable line and tube positions. Endotracheal tube 1.3 cm above the carina. Slight proximal repositioning may prove useful. 2. Extensive diffuse bilateral neck and chest wall subcutaneous emphysema. No definite pneumothorax identified. A tiny pneumothorax with be difficult to detect due to extensive subcutaneous emphysema. 3. Persistent diffuse interstitial prominence. Right mid lung field subsegmental atelectasis and/or focal infiltrate .   Electronically Signed   By: 13/08/2014  Register   On: 08/11/2014 07:52   Dg Chest Port 1 View  08/26/2014   CLINICAL DATA:  Nasogastric tube placement  EXAM: PORTABLE CHEST - 1 VIEW  COMPARISON:  Study obtained earlier in the day.  FINDINGS: Nasogastric tube tip and side port are below the diaphragm. Endotracheal tube tip is 2.6 cm above the carina. Central catheter tip is in the superior vena cava.  There is no appreciable pneumothorax. There is, however, pneumomediastinum as well as extensive subcutaneous emphysema throughout the chest and and neck regions, essentially stable compared to study obtained earlier in the day. There is diffuse interstitial edema with mild patchy alveolar prominence in both upper lobe regions. Heart size and pulmonary vascularity are normal.  IMPRESSION: Widespread subcutaneous emphysema as well as pneumomediastinum. No pneumothorax is demonstrable currently by radiography. Persistent interstitial edema diffusely. Tube and catheter positions as described without pneumothorax. Note that the nasogastric tube tip and side port are below the diaphragm currently.   Electronically Signed   By: 13/08/2014 M.D.   On: 07/30/2014 11:07   Dg Chest Port 1  View  08/27/2014   CLINICAL DATA:  Acute respiratory failure.  EXAM: PORTABLE CHEST - 1 VIEW  COMPARISON:  08/27/2014.  FINDINGS: Endotracheal tube in stable position. Left IJ line in stable position. Mediastinum hilar structures are unremarkable. Heart size normal.  Diffuse pulmonary interstitial infiltrates are again noted, unchanged. No pleural effusion. Tiny left apical pneumothorax cannot be excluded. Progressive extensive bilateral chest wall and supraclavicular subcutaneous emphysema. No focal osseous lesion.  IMPRESSION: 1. Endotracheal tube and left IJ line in stable position. 2. Interim severe progression of bilateral chest wall and supraclavicular subcutaneous emphysema. 3. Tiny left apical pneumothorax cannot be excluded. 4. Diffuse unchanged prominent bilateral pulmonary interstitial infiltrates. Critical Value/emergent results were called by telephone at the time of interpretation on 08/11/2014 at 9:18 am to nurse Ruby, who verbally acknowledged these results.   Electronically Signed   By: Maisie Fus  Register   On: 08/23/2014 09:20   Dg Chest Port 1 View  08/14/2014   CLINICAL DATA:  Respiratory failure  EXAM: PORTABLE CHEST - 1 VIEW  COMPARISON:  August 09, 2014  FINDINGS: There is been interval removal of nasogastric tube. Endotracheal tube tip is 2.0 cm above the carina. Central catheter tip is in the superior vena cava. No pneumothorax is apparent. However, there is now pneumomediastinum as well as extensive subcutaneous air in the neck regions bilaterally.  There is generalized interstitial edema with patchy areas of consolidation in the left upper lobe and right upper lobe and mid lung regions. There is no new opacity. The heart size is normal. There is a hiatal hernia. Pulmonary vascularity is normal. No adenopathy.  IMPRESSION: Tube and catheter positions as described without pneumothorax. There is no convincing pneumothorax. However, there is extensive pneumomediastinum as well as extensive subcutaneous air throughout the neck and supraclavicular regions.  There remains interstitial edema with patchy alveolar consolidation, stable. No new opacity. No change in cardiac silhouette.   Electronically Signed   By: Bretta Bang M.D.   On: 08/23/2014  07:21   Dg Chest Port 1v Same Day  08/09/2014   CLINICAL DATA:  Nasogastric tube placement, recent diagnosis of pneumonia, diabetes mellitus, on ventilator  EXAM: PORTABLE CHEST - 1 VIEW SAME DAY  COMPARISON:  Portable exam 1032 hr compared to 0509 hr ; correlation CT chest 08/06/2014  FINDINGS: Tip of endotracheal tube projects 4.1 cm above carina.  Nasogastric tube terminates LEFT paraspinal at inferior T9 just above a large hiatal hernia.  The course of the nasogastric tube matches the course of the thoracic esophagus on prior CT.  LEFT jugular central venous catheter tip projects over SVC.  Normal heart size and pulmonary vascularity.  Diffuse pulmonary infiltrates and scattered areas of atelectasis in LEFT upper lobe and RIGHT mid lung again noted.  No gross pleural effusion or pneumothorax.  IMPRESSION: Tip of nasogastric tube projects over the thoracic esophagus just proximal to a large hiatal hernia LEFT of midline, following the course of the thoracic esophagus as identified on recent CT chest.  Persistent pulmonary infiltrates.   Electronically Signed   By: Ulyses Southward M.D.   On: 08/09/2014 10:50   Dg Abd Portable 1v  08/09/2014   CLINICAL DATA:  Nasogastric tube placement  EXAM: PORTABLE ABDOMEN - 1 VIEW  COMPARISON:  August 08, 2014  FINDINGS: This study was obtained to focus of the lower chest and upper abdomen regions. Nasogastric tube tip is in the distal esophagus with the side port in the midesophagus. Visualized bowel gas pattern is  normal. Edema and patchy consolidation remain in the lungs.  IMPRESSION: Nasogastric tube tip and side port in stomach. Advise advancing nasogastric tube at least 14 cm. Visualized bowel gas pattern unremarkable.  Critical Value/emergent results were called by telephone at the time of interpretation on 08/09/2014 at 10:46 am to Effie Berkshire, RN, who verbally acknowledged these results.   Electronically Signed   By: Bretta Bang M.D.   On: 08/09/2014  10:47     Medications: Scheduled Meds: . sodium chloride   Intravenous Once  . antiseptic oral rinse  7 mL Mouth Rinse QID  . calcium-vitamin D  1 tablet Oral Daily  . [START ON 08/13/2014] ceFEPime (MAXIPIME) IV  1 g Intravenous Q24H  . chlorhexidine  15 mL Mouth Rinse BID  . famotidine (PEPCID) IV  20 mg Intravenous Q12H  . feeding supplement (PRO-STAT SUGAR FREE 64)  30 mL Per Tube q morning - 10a  . folic acid  1 mg Oral BID  . heparin subcutaneous  5,000 Units Subcutaneous 3 times per day  . hydrocortisone sod succinate (SOLU-CORTEF) inj  50 mg Intravenous Q8H  . insulin aspart  0-20 Units Subcutaneous 6 times per day  . insulin glargine  20 Units Subcutaneous BID  . lip balm  1 application Topical BID  . magic mouthwash  15 mL Oral TID  . saccharomyces boulardii  250 mg Oral BID  . senna-docusate  2 tablet Oral BID  . sodium chloride  3 mL Intravenous Q12H  . vancomycin  750 mg Intravenous Q12H   Continuous Infusions: . sodium chloride 10 mL/hr at 08/09/2014 1900  . Marland KitchenTPN (CLINIMIX-E) Adult 40 mL/hr at 08/23/2014 1900   And  . fat emulsion 500 kcal (08/19/2014 1900)  . fentaNYL infusion INTRAVENOUS 300 mcg/hr (08/11/14 0600)  . midazolam (VERSED) infusion 4 mg/hr (08/11/14 0600)  . phenylephrine (NEO-SYNEPHRINE) Adult infusion 180 mcg/min (08/11/14 0850)  .  sodium bicarbonate 150 mEq in sterile water 1000 mL infusion     PRN Meds:.albuterol, bisacodyl, fentaNYL, midazolam, ondansetron **OR** ondansetron (ZOFRAN) IV    Assessment/Plan: 80 y.o. female with ARDS, MOSF now  EGD to place NG tube in distal stomach was successful.  No evidence of trauma to esophageal wall (to explain the pneumomediastinum).  She has no bowel sounds and so would not use NG tube to feed her at this point.  Will follow along.    Talking to her son, it seems quite possible that the large paraesophageal HH has been symptomatic for at least the past year or so.  Given it's size, could certainly have  increased risk for her aspiration pneumonia.     Patricia Fee, MD  08/11/2014, 8:53 AM  Gastroenterology Pager [phone]

## 2014-08-11 NOTE — Plan of Care (Signed)
Problem: Phase I Progression Outcomes Goal: Pain controlled with appropriate interventions Outcome: Completed/Met Date Met:  08/11/14     

## 2014-08-11 NOTE — Progress Notes (Addendum)
NUTRITION FOLLOW UP  Intervention:  -TPN per pharmacy -Consider initiation of Oxepa at trickle feeds of 10-15 ml/hr for 24 hours when pt exhibits bowel sounds -Increase Oxepa to goal rate of 40 ml/hr w/PS once daily as tolerated -Discontinued Pro-Stat; will re-order as tolerared -RD to continue to follow  Nutrition Dx:   Inadequate oral intake related to inability to eat as evidenced by npo status.; ongoing  Goal:   Patient to meet >90% estimated needs with enteral nutrition and TPN; ongoing  Monitor:   TF order/tolerance, total protein/energy intake, labs, weights  Assessment:   11/09: Patent with progressive respiratory failure, intubated this am. Admitted with CAP. Hyponatremia. Patient was eating about 50% of her meals earlier in the stay. Stable weiht around 140 lbs.   Las BM 11/5 and received rx from RN for this today. Oxepa at 20 ml/hr and tolerating well.  Patient is currently intubated on ventilator support MV: 11.3 L/min Temp (24hrs), Avg:98.8 F (37.1 C), Min:98.3 F (36.8 C), Max:99.5 F (37.5 C)  11/11: -Pt tolerating Oxepa at 40 ml/hr on 11/10.Minimal residuals of 20-40 ml reported. Had not yet received Pro-Stat. RD ordered 30 ml once daily via tube -Per review of RN notes, TF was later held d/t tube being "curled in mouth". NGT removed. -GI recommended NGT be replaced endoscopically d/t concerns for paraesophageal hernia. Plan for placement on 11/12  11/13: -Pt remains intubted -NGT able to be placed, however no bowel sounds per surgery. Recommended to utilize TPN until bowel function returned. Consider initiating Oxepa at trickle feeds of 10-15 ml/hr for 24 hours, with advancing to goal rate of 40 ml/hr as tolerated -Clinimix E 5/15 initiated at 40 ml/hr -Pt with elevated CBGs, likely r/t to initiation of TPN and hx of DM2. Discussed with pharmacy, who planned on modifying insulin.  -Per pharmacy note: Clinimix E 5/15 at a goal rate of 19ml/hr + 20% fat  emulsion at 9ml/hr will provide 72 g/day protein, 1502 Kcal/day. -Wt increased +20 lb since 11/11, likely r/t edema. Pt +5 L fluid balance -Phos/K/Mg WNL  Height: Ht Readings from Last 1 Encounters:  08/18/2014 5\' 5"  (1.651 m)    Weight Status:   Wt Readings from Last 1 Encounters:  08/11/14 172 lb 2.9 oz (78.1 kg)  08/09/14 154 lb  Re-estimated needs:  Kcal: 1474 Protein: 70-80 gm  Fluid: >/=1.6L  Skin: WDL, +2 generalized edema, + 2 RUE edema, +1 LUE edema  Diet Order: Diet NPO time specified TPN (CLINIMIX-E) Adult TPN (CLINIMIX-E) Adult   Intake/Output Summary (Last 24 hours) at 08/11/14 1404 Last data filed at 08/11/14 1200  Gross per 24 hour  Intake 5614.6 ml  Output    395 ml  Net 5219.6 ml    Last BM: 11/05   Labs:   Recent Labs Lab 08/07/14 0355  08/09/14 0440 08/05/2014 0430 08/11/14 0345  NA 129*  < > 136* 131* 125*  K 5.3  < > 4.7 4.7 4.7  CL 90*  < > 103 100 90*  CO2 25  < > 23 21 20   BUN 14  < > 16 17 29*  CREATININE 0.64  < > 0.61 0.62 1.17*  CALCIUM 9.5  < > 8.4 8.2* 7.5*  MG 2.0  --   --   --  2.2  PHOS  --   --   --   --  4.0  GLUCOSE 161*  < > 119* 133* 509*  < > = values in this interval not  displayed.  CBG (last 3)   Recent Labs  08/11/14 0830 08/11/14 1222 08/11/14 1223  GLUCAP 503* 468* 430*    Scheduled Meds: . sodium chloride   Intravenous Once  . antiseptic oral rinse  7 mL Mouth Rinse QID  . calcium-vitamin D  1 tablet Oral Daily  . [START ON 08/17/2014] ceFEPime (MAXIPIME) IV  1 g Intravenous Q24H  . chlorhexidine  15 mL Mouth Rinse BID  . famotidine (PEPCID) IV  20 mg Intravenous Q12H  . feeding supplement (PRO-STAT SUGAR FREE 64)  30 mL Per Tube q morning - 10a  . folic acid  1 mg Oral BID  . furosemide  160 mg Intravenous 3 times per day  . heparin subcutaneous  5,000 Units Subcutaneous 3 times per day  . hydrocortisone sod succinate (SOLU-CORTEF) inj  50 mg Intravenous Q8H  . insulin aspart  0-20 Units  Subcutaneous 6 times per day  . insulin glargine  20 Units Subcutaneous BID  . lip balm  1 application Topical BID  . magic mouthwash  15 mL Oral TID  . saccharomyces boulardii  250 mg Oral BID  . senna-docusate  2 tablet Oral BID  . sodium chloride  3 mL Intravenous Q12H  . vancomycin  750 mg Intravenous Q12H    Continuous Infusions: . sodium chloride 10 mL/hr at 08/28/2014 1900  . Marland KitchenTPN (CLINIMIX-E) Adult 40 mL/hr at 08/15/2014 1900   And  . fat emulsion 500 kcal (08/04/2014 1900)  . Marland KitchenTPN (CLINIMIX-E) Adult     And  . fat emulsion    . fentaNYL infusion INTRAVENOUS 300 mcg/hr (08/11/14 0900)  . midazolam (VERSED) infusion 4 mg/hr (08/11/14 0600)  . phenylephrine (NEO-SYNEPHRINE) Adult infusion 170 mcg/min (08/11/14 1343)  .  sodium bicarbonate 150 mEq in sterile water 1000 mL infusion 125 mL/hr at 08/11/14 0086    Lloyd Huger MS RD LDN Clinical Dietitian Pager:[phone]

## 2014-08-12 ENCOUNTER — Inpatient Hospital Stay (HOSPITAL_COMMUNITY): Payer: Medicare Other

## 2014-08-12 DIAGNOSIS — J9601 Acute respiratory failure with hypoxia: Secondary | ICD-10-CM

## 2014-08-12 LAB — TYPE AND SCREEN
ABO/RH(D): A POS
Antibody Screen: NEGATIVE
Unit division: 0

## 2014-08-12 LAB — BLOOD GAS, ARTERIAL
ACID-BASE EXCESS: 0.7 mmol/L (ref 0.0–2.0)
BICARBONATE: 26.3 meq/L — AB (ref 20.0–24.0)
Drawn by: 235321
FIO2: 1 %
O2 Saturation: 85.2 %
PATIENT TEMPERATURE: 98.6
PO2 ART: 51.5 mmHg — AB (ref 80.0–100.0)
TCO2: 25.7 mmol/L (ref 0–100)
pCO2 arterial: 52.2 mmHg — ABNORMAL HIGH (ref 35.0–45.0)
pH, Arterial: 7.323 — ABNORMAL LOW (ref 7.350–7.450)

## 2014-08-12 MED FILL — Medication: Qty: 1 | Status: AC

## 2014-08-13 LAB — CULTURE, BLOOD (ROUTINE X 2)
CULTURE: NO GROWTH
Culture: NO GROWTH

## 2014-08-14 ENCOUNTER — Encounter: Payer: Self-pay | Admitting: Internal Medicine

## 2014-08-16 ENCOUNTER — Encounter (HOSPITAL_COMMUNITY): Payer: Medicare Other

## 2014-08-21 ENCOUNTER — Telehealth: Payer: Self-pay | Admitting: Internal Medicine

## 2014-08-21 NOTE — Telephone Encounter (Signed)
Patricia Mosley 05-30-1934 - Son Dr Darel Hong called [facility] wanting to talk to Dr Vassie Loll . Son cell is [phone]. Let Dr Vassie Loll know and I am sending this message to Dr Vassie Loll as well  Dr. Kalman Shan, M.D., [facility].C.P Pulmonary and Critical Care Medicine Staff Physician [facility] Pager: [phone], If no answer or between  15:00h - 7:00h: call [phone]  08/21/2014 8:17 PM

## 2014-08-22 NOTE — Telephone Encounter (Signed)
Tried calling-mailboxes full. Will try again

## 2014-08-29 NOTE — Progress Notes (Signed)
Fentanyl wasted from bag (60 mL) in sink. Versed wasted from bag (30 mL) in sink.  Witnessed by [employer].  Ronal Fear, RN.

## 2014-08-29 NOTE — Progress Notes (Signed)
Pt terminally extubated to room air. Family and MD at the bedside.

## 2014-08-29 NOTE — Significant Event (Addendum)
Directed by [facility] to come to patient in CT scanner due to cardiopulmonary instability. Patient had developed pupillary changes as well as decreasing heart rate. Arrived in CT scan and found patient with son (who is physician) and recently obtained CT of the head which showed diffuse areas of infarction and general edema with early evidence of herniation. This was felt to be consistent with a subacute anoxic brain injury with impending herniation. I discussed the patient at length with her son, as well as the radiologist who agreed with this assessment. Met with the patient's husband, son, and daughter in law. Given that there is strong evidence of neurologic devastation, reccommended terminal extubation. Patient's family is planning on taking some time to regroup and will proceed. We will check back later on tonight. Code status changed to DNR.  CRITICAL CARE Performed by: Luz Brazen   Total critical care time: 90 minutes  Critical care time was exclusive of separately billable procedures and treating other patients.  Critical care was necessary to treat or prevent imminent or life-threatening deterioration.  Critical care was time spent personally by me on the following activities: development of treatment plan with patient and/or surrogate as well as nursing, discussions with consultants, evaluation of patient's response to treatment, examination of patient, obtaining history from patient or surrogate, ordering and performing treatments and interventions, ordering and review of laboratory studies, ordering and review of radiographic studies, pulse oximetry and re-evaluation of patient's condition.   Luz Brazen, MD Pulmonary & Critical Care Medicine 08/28/14, 2:30 AM

## 2014-08-29 NOTE — ED Provider Notes (Signed)
12:00 pm  Called to a code blue in the CT scanner. Patient with palpable pulses; however, desaturated with transition from the stretcher to the CT scanner. Patient currently in the ICU, intubated for ARDS and pneumonia. Upon my arrival, patient had palpable pulses. Heart rate in the 60s. Poor waveform on O2 monitoring. Bagging easily. Extensive subcutaneous air and diminished breath sounds bilaterally.  I requested an ABG. Son is present in the CT scanner. He gave collateral information. Patient has progressively gotten more ill during her stay and was brought to the CT scanner to get a head CT to evaluate a blown pupil.  Patient stable at this time and CT obtained. Critical care updated and is sending a provider.    Shon Baton, MD 08/13/2014 [phone]

## 2014-08-29 NOTE — Progress Notes (Signed)
Pt has had an unstable course during the night.  Multiple consults to CCM with new orders received and initiated.  Pt has required numerous support and interventions regarding IV drips and ventilator support.  Pt found to have unequal and nonreactive pupils taken to CT scan and O2 sats dropped with the movement of pt from bed to scanner.  Code Blue called d/t need of more support.  Pt didn't lose pulse nor did she respiratory arrest.  Pt son at bedside.  CCM, MD also was called to CT.  Pt was transferred back to ICU room 1230 and was made a DNR per her family after speaking with MD.  Pt was terminally weaned per MD orders at 3:27 am.  Pt expired at 3:41 am peacefully with family/MD/RN at the bedside.  Family emotional as expected.  Two RN's pronounced (no spontaneous heart sounds or breath sounds auscultated).  Pt family declined autopsy. Pt is not a ME case either per Luanna Salk, ME.

## 2014-08-29 NOTE — Progress Notes (Signed)
Per CT scan, ETT in the right mainstem post transport. Per Dr. Eugenia Pancoast, ETT pulled back 2cm. ETT now 21 at the lip. Diminished bilateral breath sounds heard throughout. SpO2 95%. RT will continue to monitor.

## 2014-08-29 DEATH — deceased

## 2014-08-31 NOTE — Telephone Encounter (Signed)
Detailed discussion with Daryl about mom's course & CT scan

## 2014-09-08 NOTE — Discharge Summary (Signed)
PULMONARY / CRITICAL CARE MEDICINE   Name: Patricia Mosley MRN: [id number] DOB: 08/05/1934    ADMISSION DATE:  08/23/2014 CONSULTATION DATE:  08/07/14  REFERRING MD :  Dr. Gonzella Lex   CHIEF COMPLAINT:  Hypoxic Respiratory Failure   INITIAL PRESENTATION: 80 y/o F admitted 11/5 with cough & generalized weakness.  Work up concerning for R CAP and patient admitted for evaluation.  Patient had progressive hypoxemia and PCCM consulted , required mech ventilation. Course complicated by subcutaneous emphysema    STUDIES:  11/07  CT Head >> no acute abnormality, chronic microvascular ischemic white matter disease  11/08  CTA Chest >> no PE, mod bilateral multifocal airspace disease, small B pleural effusions, mod-lg hiatal hernia 11/12 EGD- no perforation, NGT placed  SIGNIFICANT EVENTS: 11/05  Admit per TRH with CAP, Rx'd as outpatient with azithro & increase in baseline prednisone  11/07  N/V 11/08  Development of worsening R sided airspace disease, CTA chest neg for Pe 11/09  Decompensated, hypoxic requiring intubation 11/10  Improved oxygenation on ABG, CXR with improved airspace disease  11/11 Severe agitation on WUA- causing vent asynchrony & desaturation, unable to place NGT 11/12 brief arrest requiring CPR & bicarb, improved with bagging  COURSE :  PULMONARY OETT 11/9 >>  A: Acute Hypoxic Respiratory Failure -  ARDS Diffuse Bilateral Airspace Disease - DDx includes aspiration (hiatal hernia + vomiting), viral / bacterial etiology, opportunistic infection / MAI (plaquenil + prednisone 7.5mg ), viral pneumonitis, rheumatoid interstitial disease.   Small Bilateral Pleural Effusions  Vent asynchrony Sub cutaneous air - concern for barotrauma -no pneumothorax P:    ARDS protocol -keep Tv at 8cc/kg,keep RR 35, titrate PEEP lower as FIO2 improves, drop to 10 - will accept higher FIO2 Keep RASS-2 for synchrony    CARDIOVASCULAR CVL  A: Septic shock  P:  Wean Neo gtt for MAP >  65   RENAL A:  AKI Metab acidosis Mild Hyperkalemia  P:   Replace electrolytes as indicated  HD cath placed  GASTROINTESTINAL A:   Large Paraesophageal Hernia -unable to place OG/NGT bedside or IR, NG placed by GI  Protein Calorie Malnutrition P:   Pepcid for SUP On protonix + pepcid at home, hold protonix with abx & C-diff risk    HEMATOLOGIC A:   Leukocytosis -worse Anemia of critical illness P:  Trend CBC Ct SQ Heparin -low threshold to stop if Hb drops further 1u PRBC, goal Hb 7 & above  INFECTIOUS A:   Bilateral Diffuse Infiltrates  P:   BCx2 11/9 >>ng Sputum 11/9 >> rare candida RVP 11/9 >> neg PCP 11/9 >>neg  Strep Pneumo 11/9 >> neg Abx: Azithro, start date 11/6 x 1 dose Abx: Rocephin, start date 11/5, x1 dose  Abx: Vanco, start date 11/8 Abx: Cefepime, start date 11/8    ENDOCRINE A:   DM II -uncontrolled, on TNA Rheumatoid Arthritis - on plaquenil + prednisone 5 mg at baseline P:   SSI Increase Lantus 20 units q 12h Drop dextrose in TNA formulation Stress dose hydrocort  NEUROLOGIC A:   Fibromyalgia  Pain - ICU discomfort + ? Fibro flare Mechanical Fall P:   RASS goal: 0 Fentanyl PAD protocol  Ct versed  gtt   FAMILY  - Updates: daily -husband & son Patricia Mosley (ophthalmologist)11/13   Summary - Worsening ARDS, sub cutaneous air , no pneumothorax or pharyngeal/esophageal leak - now in renal failure & increasing pressors. Prognosis poor & family understand this, would still like [place] to continue  resuscitation measures including dialysis if needed. Developed dilated pupils, head CT confirmed anxoic injury &  tonsillar herniation on 11/14, DNr issued & terminal withdrawal .  Cause of death - ARDS, aspiration pneumonia   Oretha Milch. MD  09/08/2014, 12:57 PM

## 2015-08-17 IMAGING — DX DG ABD PORTABLE 1V
2 series · 2 of 2 positions shown · non-contrast
Comparison: August 08, 2014

CLINICAL DATA: Nasogastric tube placement

EXAM:
PORTABLE ABDOMEN - 1 VIEW

[chest ap (1 of 2)]
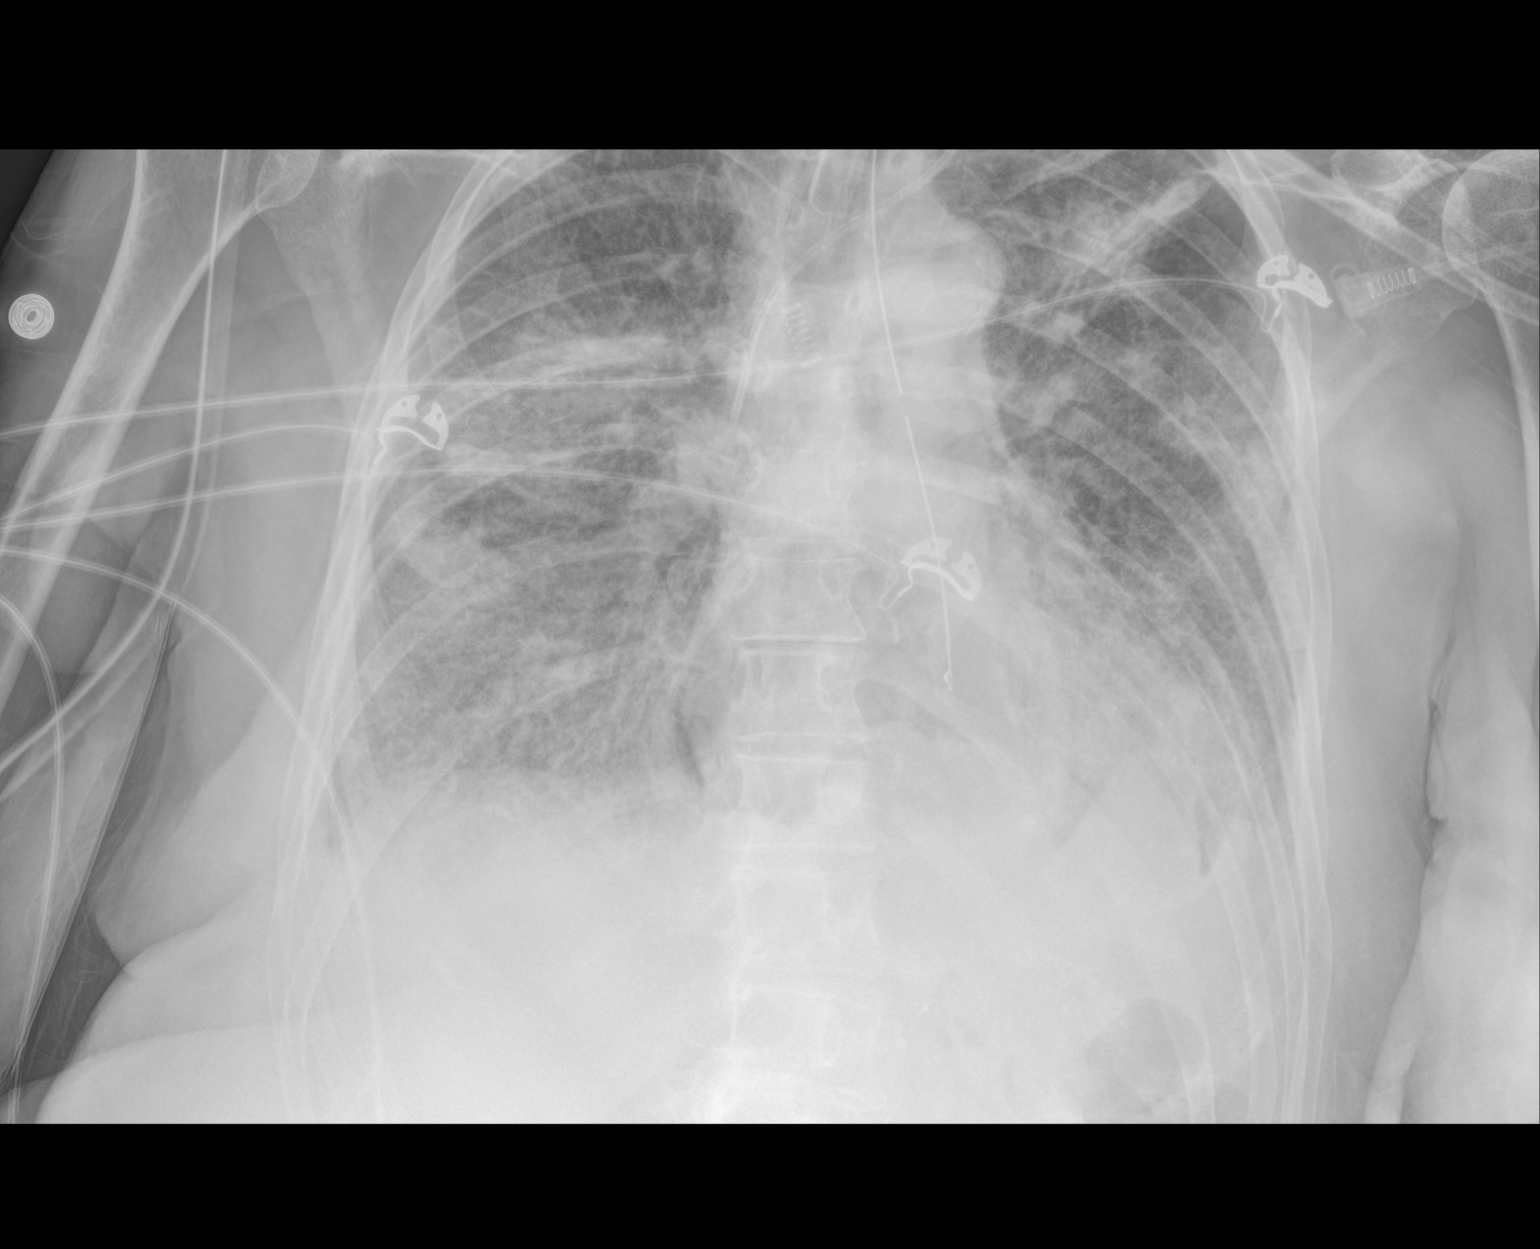

[chest ap (2 of 2)]
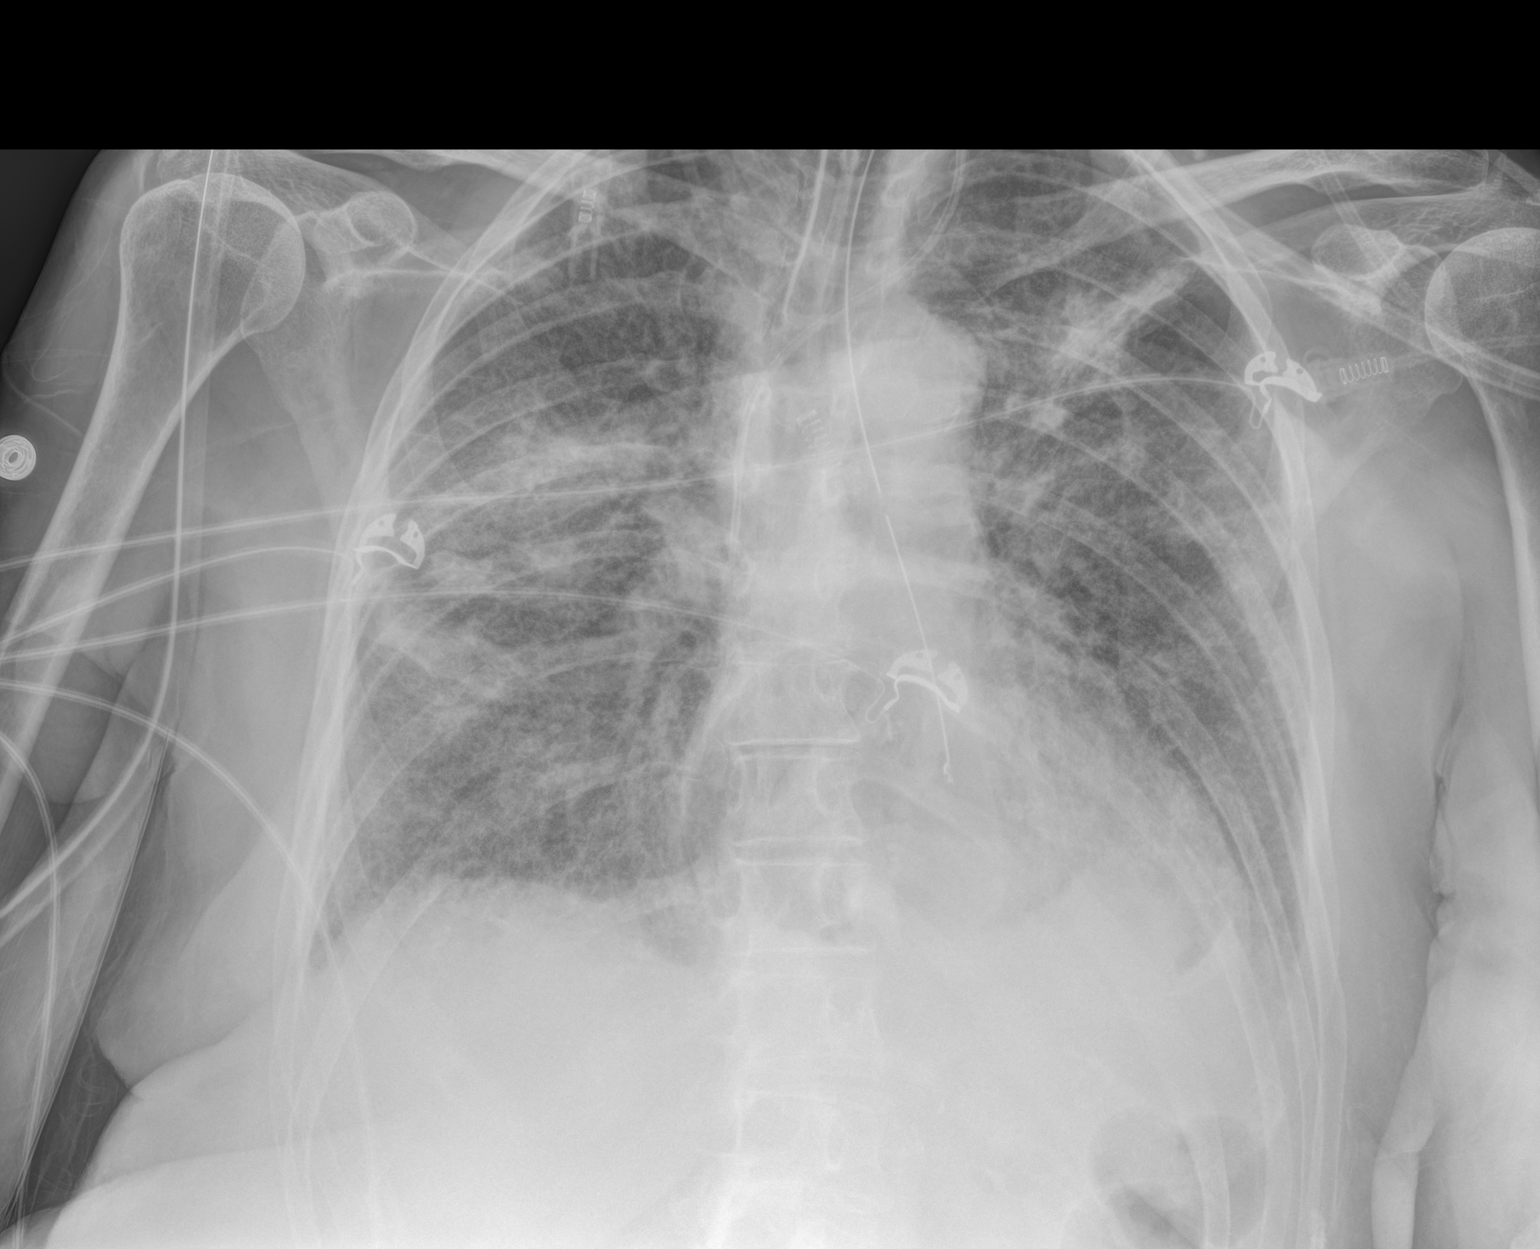

[2 of 2 positions shown; findings below may reference images not displayed]

FINDINGS: This study was obtained to focus of the lower chest and upper
abdomen regions. Nasogastric tube tip is in the distal esophagus
with the side port in the midesophagus. Visualized bowel gas pattern
is normal. Edema and patchy consolidation remain in the lungs.
IMPRESSION: Nasogastric tube tip and side port in stomach. Advise advancing
nasogastric tube at least 14 cm. Visualized bowel gas pattern
unremarkable.

Critical Value/emergent results were called by telephone at the time
of interpretation on 08/09/2014 at [DATE] to Connieanne Uzik, RN,
who verbally acknowledged these results.

## 2015-08-17 IMAGING — DX DG CHEST 1V PORT SAME DAY
1 series · 1 of 1 positions shown · non-contrast
Comparison: Portable exam 2724 hr compared to 7471 hr ; correlation
CT chest 08/06/2014

CLINICAL DATA: Nasogastric tube placement, recent diagnosis of
pneumonia, diabetes mellitus, on ventilator

EXAM:
PORTABLE CHEST - 1 VIEW SAME DAY

[chest ap]
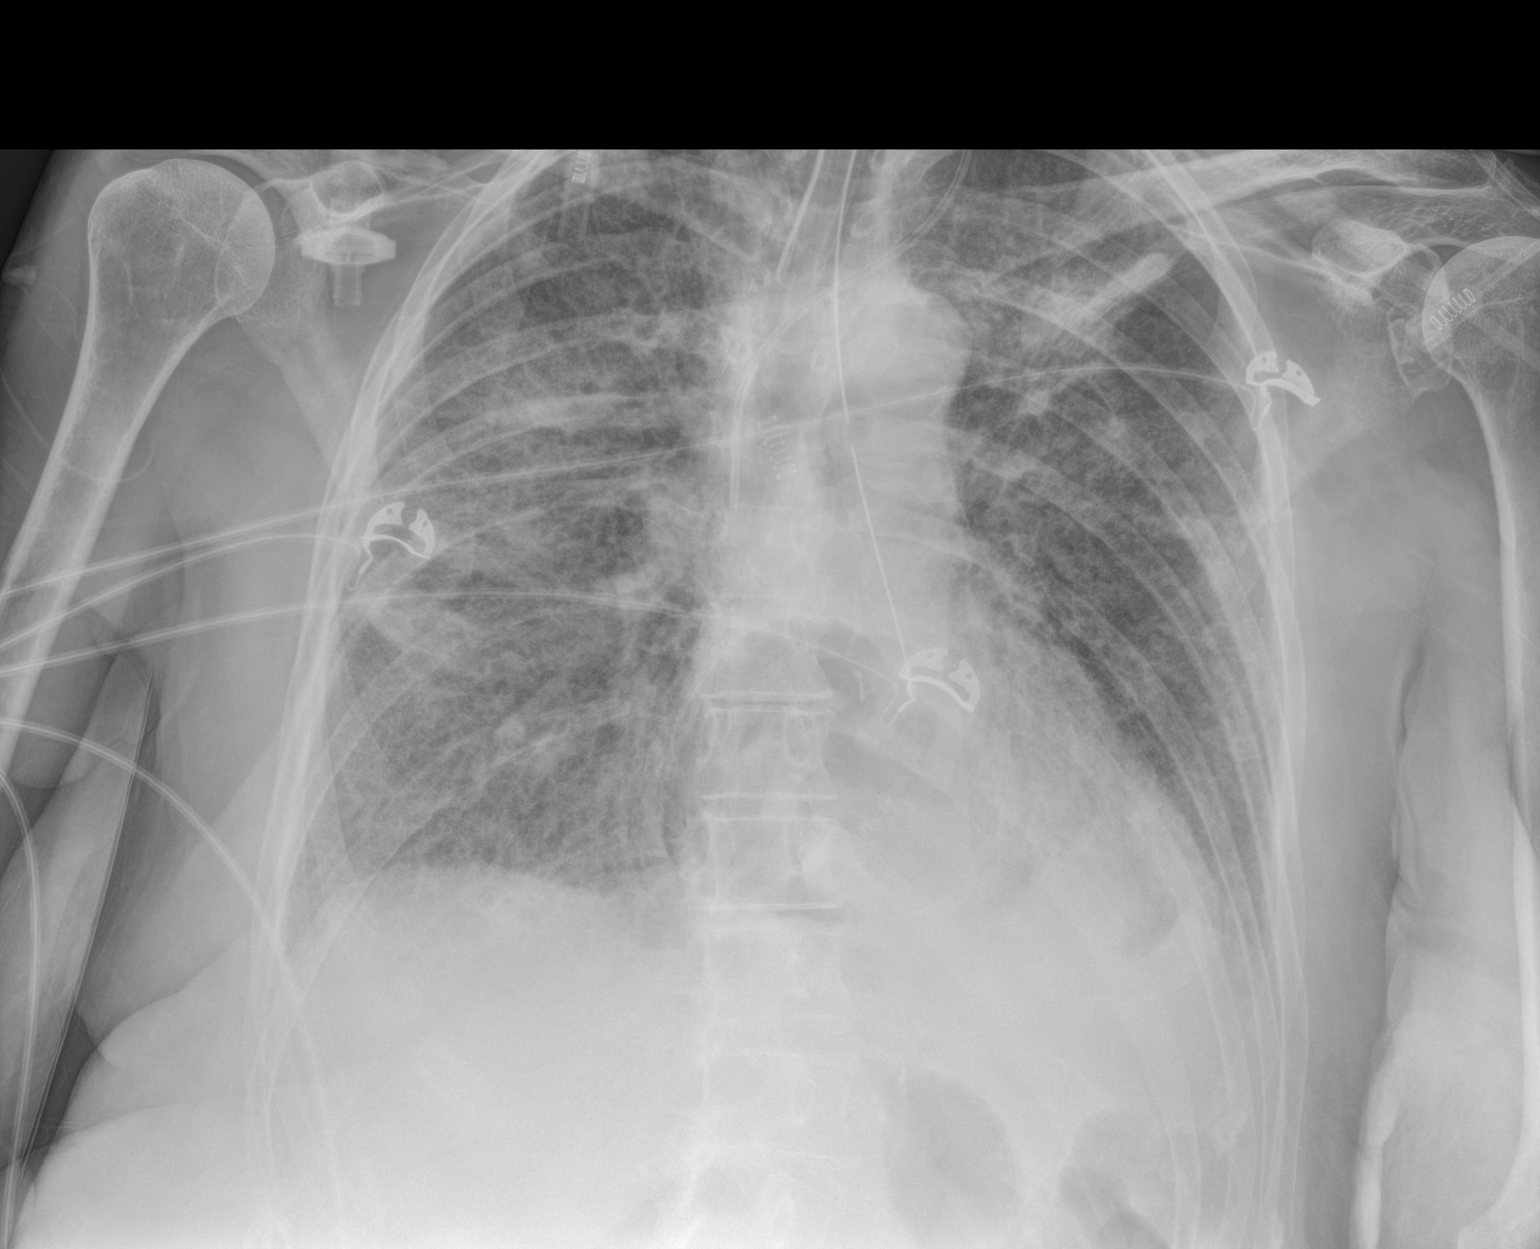

[1 of 1 positions shown; findings below may reference images not displayed]

FINDINGS: Tip of endotracheal tube projects 4.1 cm above carina.

Nasogastric tube terminates LEFT paraspinal at inferior T9 just
above a large hiatal hernia.

The course of the nasogastric tube matches the course of the
thoracic esophagus on prior CT.

LEFT jugular central venous catheter tip projects over SVC.

Normal heart size and pulmonary vascularity.

Diffuse pulmonary infiltrates and scattered areas of atelectasis in
LEFT upper lobe and RIGHT mid lung again noted.

No gross pleural effusion or pneumothorax.
IMPRESSION: Tip of nasogastric tube projects over the thoracic esophagus just
proximal to a large hiatal hernia LEFT of midline, following the
course of the thoracic esophagus as identified on recent CT chest.

Persistent pulmonary infiltrates.

## 2015-08-18 IMAGING — DX DG CHEST 1V PORT
1 series · 1 of 1 positions shown · non-contrast
Comparison: 08/10/2014.

CLINICAL DATA: Acute respiratory failure.

EXAM:
PORTABLE CHEST - 1 VIEW

[chest ap]
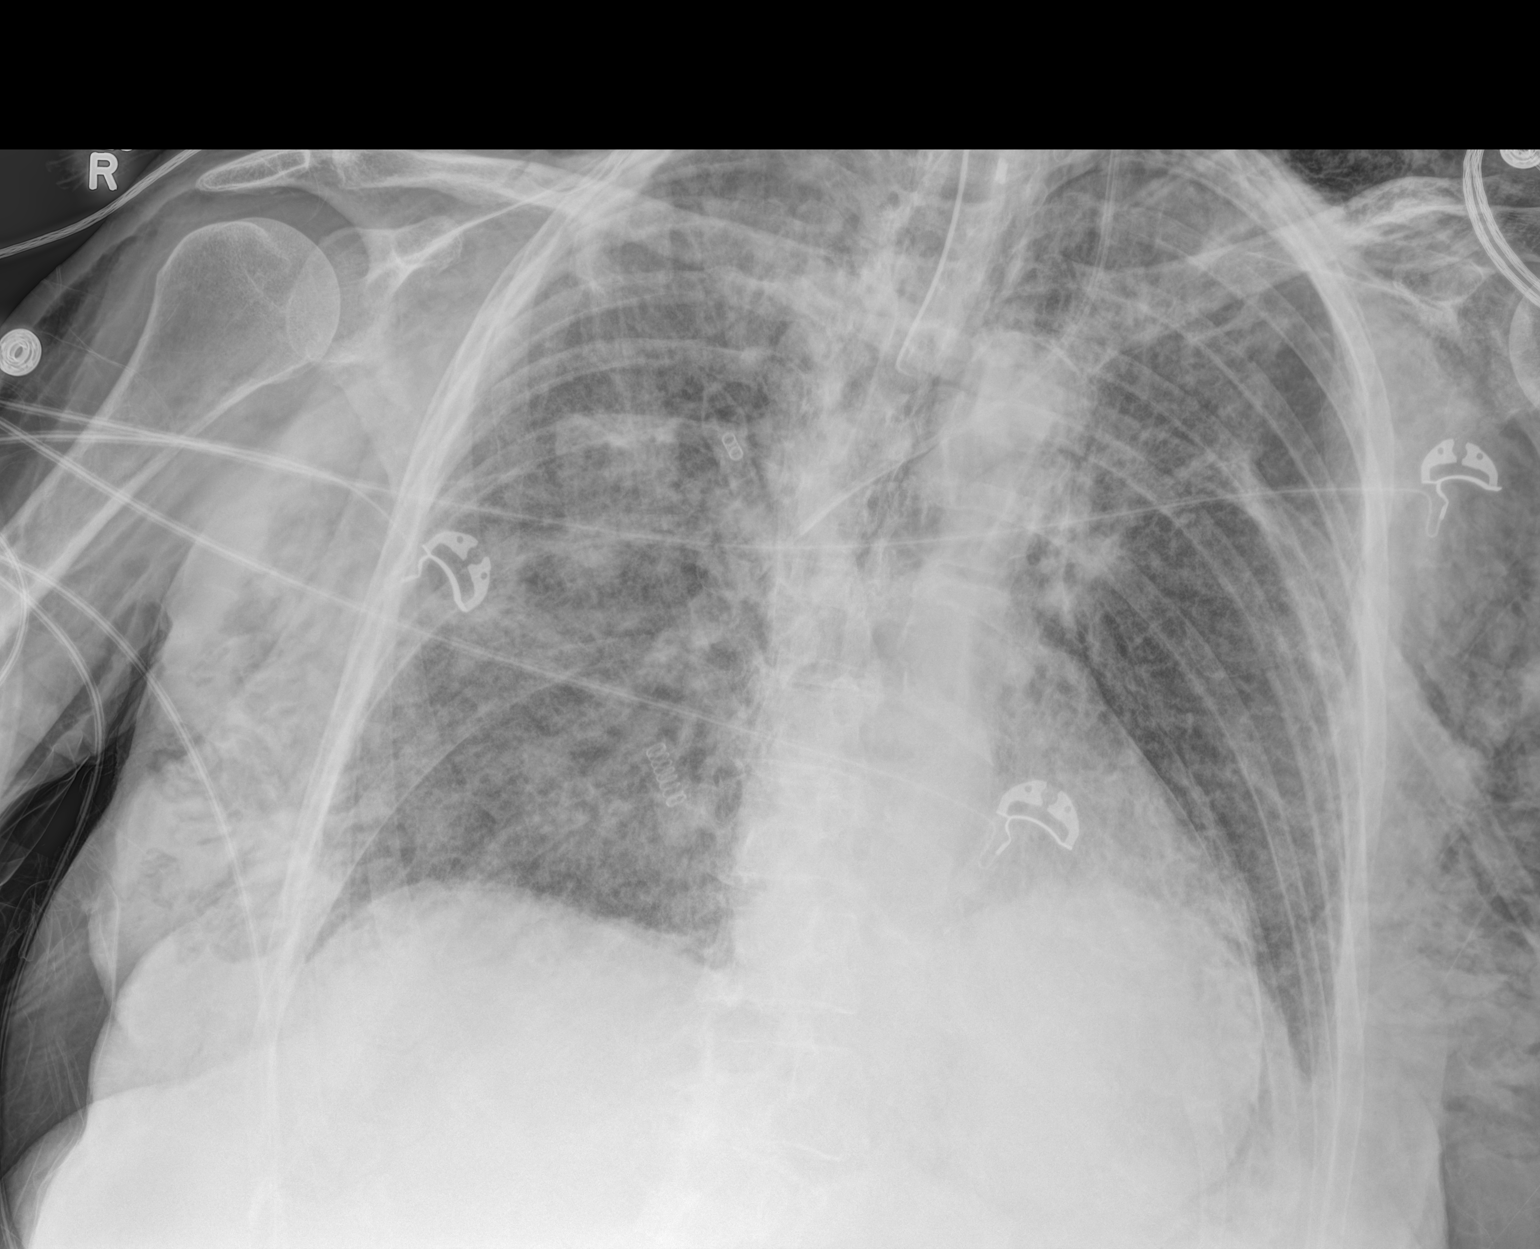

[1 of 1 positions shown; findings below may reference images not displayed]

FINDINGS: Endotracheal tube in stable position. Left IJ line in stable
position. Mediastinum hilar structures are unremarkable. Heart size
normal. Diffuse pulmonary interstitial infiltrates are again noted,
unchanged. No pleural effusion. Tiny left apical pneumothorax cannot
be excluded. Progressive extensive bilateral chest wall and
supraclavicular subcutaneous emphysema. No focal osseous lesion.
IMPRESSION: 1. Endotracheal tube and left IJ line in stable position.
2. Interim severe progression of bilateral chest wall and
supraclavicular subcutaneous emphysema.
3. Tiny left apical pneumothorax cannot be excluded.
4. Diffuse unchanged prominent bilateral pulmonary interstitial
infiltrates.
Critical Value/emergent results were called by telephone at the time
of interpretation on 08/10/2014 at [DATE] to [REDACTED], who
verbally acknowledged these results.

## 2015-08-18 IMAGING — DX DG CHEST 1V PORT
1 series · 2 of 2 positions shown · non-contrast
Comparison: Study obtained earlier in the day.

CLINICAL DATA: Nasogastric tube placement

EXAM:
PORTABLE CHEST - 1 VIEW

[Series 1: chest ap · 0.14mm/px · 2 of 2 slices shown]
[im 1/2]
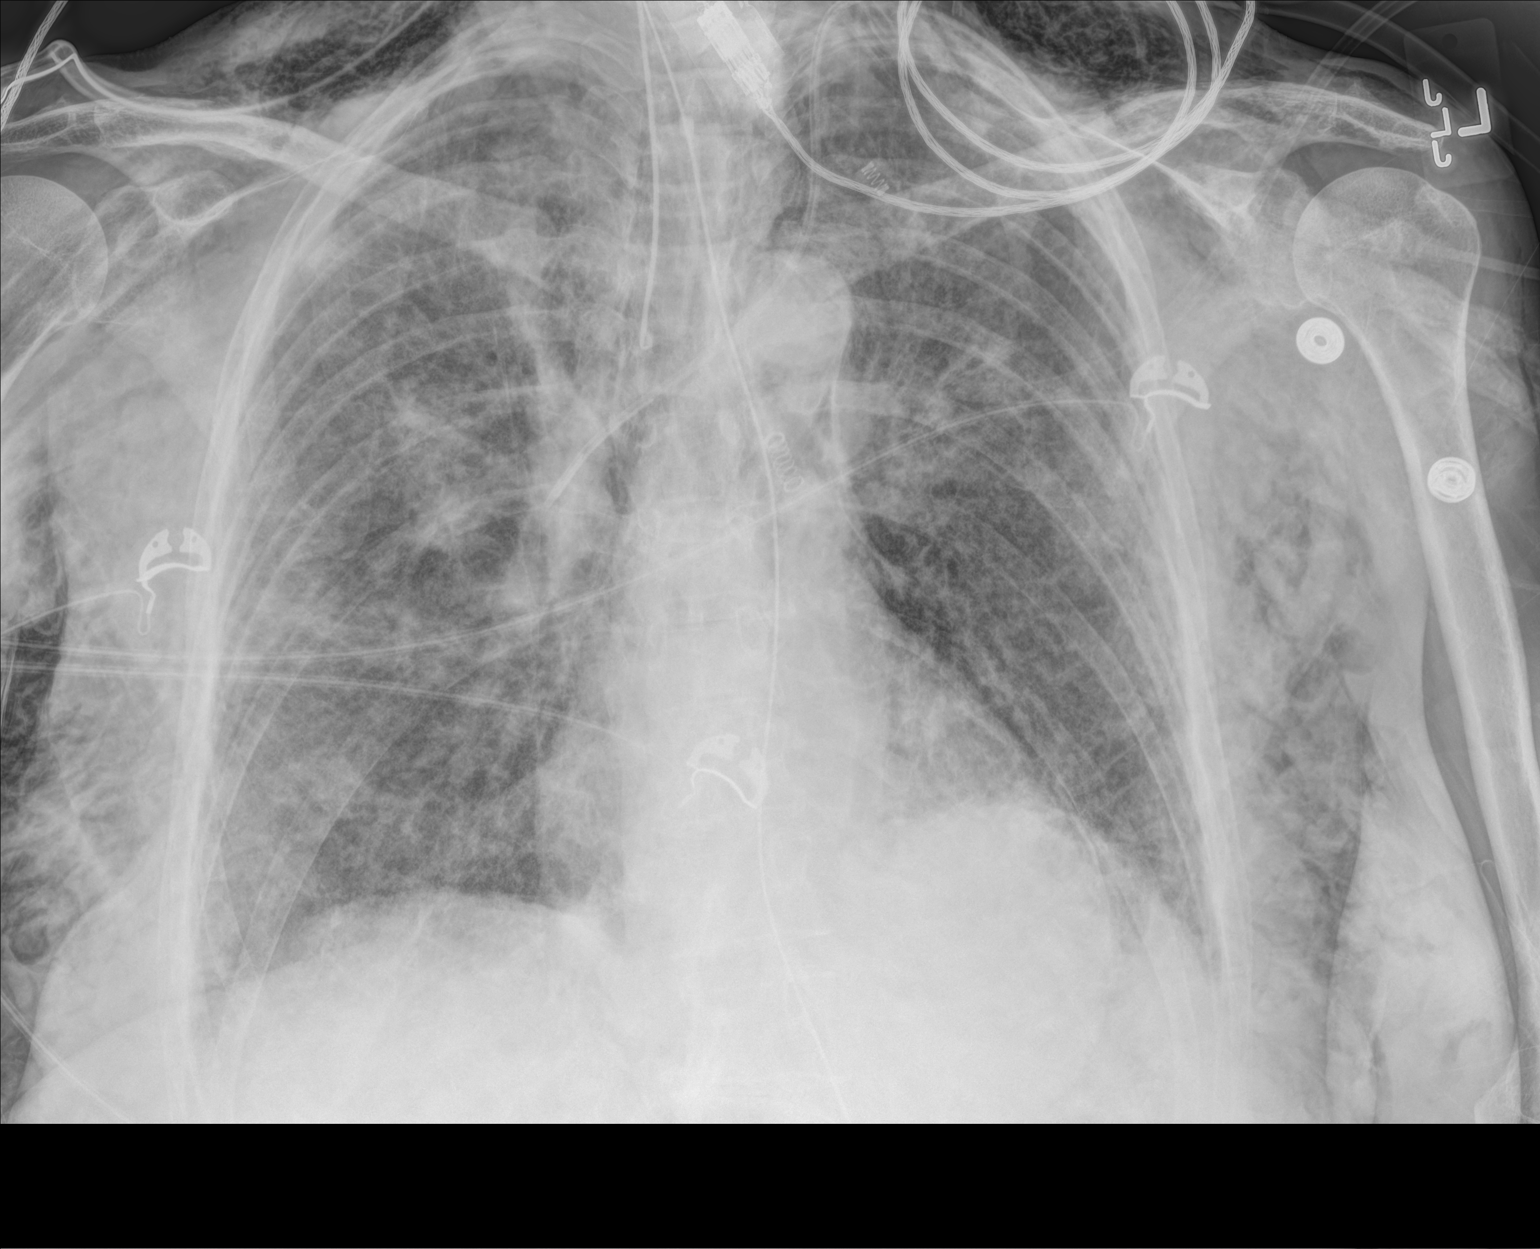
[im 2/2]
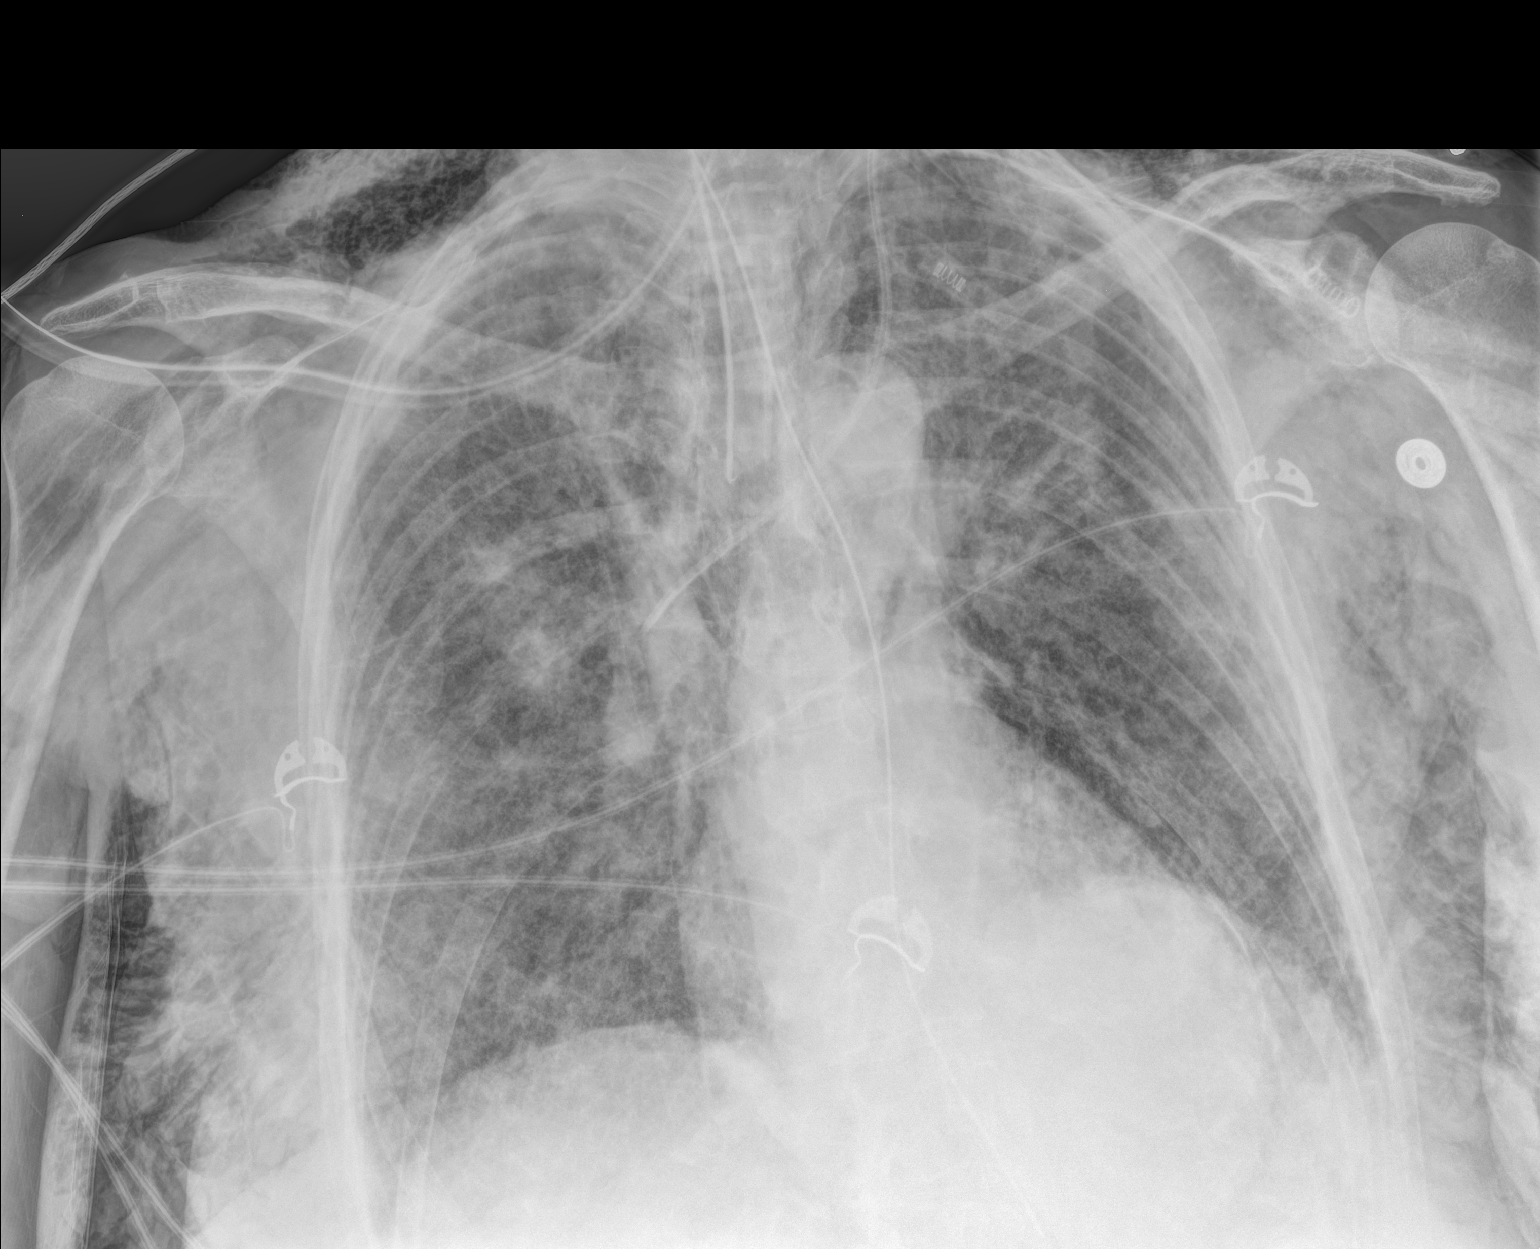

[2 of 2 positions shown; findings below may reference images not displayed]

FINDINGS: Nasogastric tube tip and side port are below the diaphragm.
Endotracheal tube tip is 2.6 cm above the carina. Central catheter
tip is in the superior vena cava.

There is no appreciable pneumothorax. There is, however,
pneumomediastinum as well as extensive subcutaneous emphysema
throughout the chest and and neck regions, essentially stable
compared to study obtained earlier in the day. There is diffuse
interstitial edema with mild patchy alveolar prominence in both
upper lobe regions. Heart size and pulmonary vascularity are normal.
IMPRESSION: Widespread subcutaneous emphysema as well as pneumomediastinum. No
pneumothorax is demonstrable currently by radiography. Persistent
interstitial edema diffusely. Tube and catheter positions as
described without pneumothorax. Note that the nasogastric tube tip
and side port are below the diaphragm currently.

## 2015-10-09 ENCOUNTER — Encounter: Payer: Self-pay | Admitting: Gastroenterology
